# Patient Record
Sex: Male | Born: 1960 | Race: White | Hispanic: No | Marital: Married | Smoking: Former smoker
Health system: Southern US, Community
[De-identification: ages and names within clinical notes are randomized; demographics above are authoritative.]

## PROBLEM LIST (undated history)

## (undated) DIAGNOSIS — I8390 Asymptomatic varicose veins of unspecified lower extremity: Secondary | ICD-10-CM

## (undated) DIAGNOSIS — G4730 Sleep apnea, unspecified: Secondary | ICD-10-CM

## (undated) DIAGNOSIS — K759 Inflammatory liver disease, unspecified: Secondary | ICD-10-CM

## (undated) DIAGNOSIS — M1990 Unspecified osteoarthritis, unspecified site: Secondary | ICD-10-CM

## (undated) DIAGNOSIS — I2510 Atherosclerotic heart disease of native coronary artery without angina pectoris: Secondary | ICD-10-CM

## (undated) DIAGNOSIS — R7301 Impaired fasting glucose: Secondary | ICD-10-CM

## (undated) DIAGNOSIS — K219 Gastro-esophageal reflux disease without esophagitis: Secondary | ICD-10-CM

## (undated) DIAGNOSIS — I10 Essential (primary) hypertension: Secondary | ICD-10-CM

## (undated) DIAGNOSIS — E669 Obesity, unspecified: Secondary | ICD-10-CM

## (undated) HISTORY — DX: Asymptomatic varicose veins of unspecified lower extremity: I83.90

## (undated) HISTORY — PX: CARDIAC CATHETERIZATION: SHX172

## (undated) HISTORY — DX: Essential (primary) hypertension: I10

## (undated) HISTORY — DX: Impaired fasting glucose: R73.01

## (undated) HISTORY — DX: Obesity, unspecified: E66.9

---

## 1989-07-19 HISTORY — PX: OTHER SURGICAL HISTORY: SHX169

## 2006-08-08 LAB — HM COLONOSCOPY

## 2007-11-19 DIAGNOSIS — R7301 Impaired fasting glucose: Secondary | ICD-10-CM

## 2007-11-19 HISTORY — DX: Impaired fasting glucose: R73.01

## 2008-03-16 ENCOUNTER — Ambulatory Visit: Payer: Self-pay | Admitting: Family Medicine

## 2008-03-16 ENCOUNTER — Encounter: Admission: RE | Admit: 2008-03-16 | Discharge: 2008-03-16 | Payer: Self-pay | Admitting: Family Medicine

## 2008-03-16 DIAGNOSIS — K219 Gastro-esophageal reflux disease without esophagitis: Secondary | ICD-10-CM | POA: Insufficient documentation

## 2008-03-17 ENCOUNTER — Telehealth: Payer: Self-pay | Admitting: Family Medicine

## 2008-03-21 ENCOUNTER — Encounter: Admission: RE | Admit: 2008-03-21 | Discharge: 2008-03-21 | Payer: Self-pay | Admitting: Family Medicine

## 2008-03-21 ENCOUNTER — Ambulatory Visit: Payer: Self-pay | Admitting: Family Medicine

## 2008-03-22 ENCOUNTER — Encounter: Admission: RE | Admit: 2008-03-22 | Discharge: 2008-03-22 | Payer: Self-pay | Admitting: Family Medicine

## 2008-03-22 ENCOUNTER — Encounter: Payer: Self-pay | Admitting: Family Medicine

## 2008-03-23 ENCOUNTER — Ambulatory Visit: Payer: Self-pay | Admitting: Family Medicine

## 2008-04-13 ENCOUNTER — Encounter: Admission: RE | Admit: 2008-04-13 | Discharge: 2008-04-13 | Payer: Self-pay | Admitting: Family Medicine

## 2008-04-13 ENCOUNTER — Ambulatory Visit: Payer: Self-pay | Admitting: Family Medicine

## 2008-04-13 LAB — CONVERTED CEMR LAB
Alkaline Phosphatase: 88 units/L (ref 39–117)
BUN: 25 mg/dL — ABNORMAL HIGH (ref 6–23)
CO2: 26 meq/L (ref 19–32)
Cholesterol: 192 mg/dL (ref 0–200)
Creatinine, Ser: 0.87 mg/dL (ref 0.40–1.50)
Glucose, Bld: 105 mg/dL — ABNORMAL HIGH (ref 70–99)
HDL: 40 mg/dL (ref >39)
Total Bilirubin: 0.6 mg/dL (ref 0.3–1.2)
Total CHOL/HDL Ratio: 4.8
Total Protein: 7.3 g/dL (ref 6.0–8.3)
Triglycerides: 162 mg/dL — ABNORMAL HIGH (ref <150)
VLDL: 32 mg/dL (ref 0–40)

## 2008-04-14 ENCOUNTER — Encounter: Payer: Self-pay | Admitting: Family Medicine

## 2008-04-20 ENCOUNTER — Ambulatory Visit: Payer: Self-pay | Admitting: Family Medicine

## 2008-04-20 DIAGNOSIS — D126 Benign neoplasm of colon, unspecified: Secondary | ICD-10-CM | POA: Insufficient documentation

## 2008-04-20 LAB — CONVERTED CEMR LAB
Blood in Urine, dipstick: NEGATIVE
Glucose, Urine, Semiquant: NEGATIVE
Specific Gravity, Urine: >=1.03
Urobilinogen, UA: 0.2
pH: 6

## 2008-08-31 ENCOUNTER — Ambulatory Visit: Payer: Self-pay | Admitting: Family Medicine

## 2008-08-31 DIAGNOSIS — R7301 Impaired fasting glucose: Secondary | ICD-10-CM | POA: Insufficient documentation

## 2008-08-31 DIAGNOSIS — I83893 Varicose veins of bilateral lower extremities with other complications: Secondary | ICD-10-CM | POA: Insufficient documentation

## 2008-08-31 LAB — CONVERTED CEMR LAB: Blood Glucose, Fasting: 108 mg/dL

## 2008-09-12 ENCOUNTER — Ambulatory Visit: Payer: Self-pay | Admitting: Family Medicine

## 2008-10-20 ENCOUNTER — Encounter: Admission: RE | Admit: 2008-10-20 | Discharge: 2008-10-20 | Payer: Self-pay | Admitting: Family Medicine

## 2008-10-20 ENCOUNTER — Ambulatory Visit: Payer: Self-pay | Admitting: Family Medicine

## 2008-10-20 DIAGNOSIS — M7710 Lateral epicondylitis, unspecified elbow: Secondary | ICD-10-CM | POA: Insufficient documentation

## 2008-11-18 DIAGNOSIS — I10 Essential (primary) hypertension: Secondary | ICD-10-CM

## 2008-11-18 HISTORY — DX: Essential (primary) hypertension: I10

## 2008-11-21 ENCOUNTER — Ambulatory Visit: Payer: Self-pay | Admitting: Family Medicine

## 2008-11-21 DIAGNOSIS — I10 Essential (primary) hypertension: Secondary | ICD-10-CM | POA: Insufficient documentation

## 2008-12-16 ENCOUNTER — Ambulatory Visit: Payer: Self-pay | Admitting: Family Medicine

## 2009-01-11 ENCOUNTER — Encounter: Payer: Self-pay | Admitting: Family Medicine

## 2009-01-13 LAB — CONVERTED CEMR LAB
BUN: 22 mg/dL (ref 6–23)
CO2: 24 meq/L (ref 19–32)
Chloride: 102 meq/L (ref 96–112)
Potassium: 4.7 meq/L (ref 3.5–5.3)

## 2009-01-16 ENCOUNTER — Telehealth: Payer: Self-pay | Admitting: Family Medicine

## 2009-02-13 ENCOUNTER — Telehealth: Payer: Self-pay | Admitting: Family Medicine

## 2009-02-15 ENCOUNTER — Telehealth: Payer: Self-pay | Admitting: Family Medicine

## 2009-02-22 ENCOUNTER — Telehealth: Payer: Self-pay | Admitting: Family Medicine

## 2009-03-31 ENCOUNTER — Ambulatory Visit: Payer: Self-pay | Admitting: Family Medicine

## 2009-03-31 ENCOUNTER — Encounter: Admission: RE | Admit: 2009-03-31 | Discharge: 2009-03-31 | Payer: Self-pay | Admitting: Family Medicine

## 2009-03-31 DIAGNOSIS — F4321 Adjustment disorder with depressed mood: Secondary | ICD-10-CM | POA: Insufficient documentation

## 2009-06-14 ENCOUNTER — Ambulatory Visit: Payer: Self-pay | Admitting: Family Medicine

## 2009-12-06 ENCOUNTER — Ambulatory Visit: Payer: Self-pay | Admitting: Family Medicine

## 2009-12-06 DIAGNOSIS — K5289 Other specified noninfective gastroenteritis and colitis: Secondary | ICD-10-CM | POA: Insufficient documentation

## 2009-12-07 LAB — CONVERTED CEMR LAB
Amylase: 33 units/L (ref 0–105)
BUN: 22 mg/dL (ref 6–23)
CO2: 22 meq/L (ref 19–32)
Calcium: 9.5 mg/dL (ref 8.4–10.5)
Chloride: 102 meq/L (ref 96–112)
Creatinine, Ser: 0.98 mg/dL (ref 0.40–1.50)
Eosinophils Absolute: 0.1 10*3/uL (ref 0.0–0.7)
Eosinophils Relative: 2 % (ref 0–5)
Glucose, Bld: 100 mg/dL — ABNORMAL HIGH (ref 70–99)
HCT: 47.2 % (ref 39.0–52.0)
Lymphs Abs: 1.5 10*3/uL (ref 0.7–4.0)
MCV: 81.5 fL (ref 78.0–100.0)
Monocytes Relative: 9 % (ref 3–12)
RBC: 5.79 M/uL (ref 4.22–5.81)
WBC: 7 10*3/uL (ref 4.0–10.5)

## 2009-12-08 ENCOUNTER — Telehealth (INDEPENDENT_AMBULATORY_CARE_PROVIDER_SITE_OTHER): Payer: Self-pay | Admitting: *Deleted

## 2009-12-11 ENCOUNTER — Telehealth: Payer: Self-pay | Admitting: Family Medicine

## 2010-01-17 ENCOUNTER — Telehealth: Payer: Self-pay | Admitting: Family Medicine

## 2010-06-04 ENCOUNTER — Telehealth (INDEPENDENT_AMBULATORY_CARE_PROVIDER_SITE_OTHER): Payer: Self-pay | Admitting: *Deleted

## 2010-06-04 ENCOUNTER — Ambulatory Visit: Payer: Self-pay | Admitting: Emergency Medicine

## 2010-06-04 DIAGNOSIS — R112 Nausea with vomiting, unspecified: Secondary | ICD-10-CM | POA: Insufficient documentation

## 2010-06-04 DIAGNOSIS — IMO0002 Reserved for concepts with insufficient information to code with codable children: Secondary | ICD-10-CM | POA: Insufficient documentation

## 2010-10-02 ENCOUNTER — Ambulatory Visit: Payer: Self-pay | Admitting: Family Medicine

## 2010-10-02 DIAGNOSIS — IMO0002 Reserved for concepts with insufficient information to code with codable children: Secondary | ICD-10-CM | POA: Insufficient documentation

## 2010-10-02 DIAGNOSIS — S39012A Strain of muscle, fascia and tendon of lower back, initial encounter: Secondary | ICD-10-CM | POA: Insufficient documentation

## 2010-12-18 NOTE — Assessment & Plan Note (Signed)
Summary: CPE   Vital Signs:  Patient Profile:   50 Years Old Male Height:     71.5 inches Weight:      284 pounds BMI:     39.20 O2 Sat:      98 % O2 treatment:    Room Air Temp:     96.9 degrees F oral Pulse rate:   82 / minute BP sitting:   123 / 72  (left arm) Cuff size:   large  Vitals Entered By: Kathlene November (April 20, 2008 8:31 AM)                 Visit Type:  est PCP:  Seymour Bars DO  Chief Complaint:  CPE .  History of Present Illness: 50 yo WM presents for CPE.  Doing well.  Had labs drawn last month showing normal cholestrol. Fasting glucose elevated at 105.  Plans to repeat today.  Denies CP or DOE.  Has lost a little weight.  Eating changes have improved a little.  Still not exercising regularly.    He is due to f/u with Dr Vernell Barrier in [facility] for colon polyps.  Last scope about 2 yrs ago.  Denies fam hx of prostate or colon cancer or fam hx of ischemic heart dz.  Has distant tobacco use history.    c/o waking up at night 2-3 x.  Drinks a lot of water all day.  Denies trouble with urinary stream or urgency.    Current Allergies: No known allergies   Past Medical History:    obesity    colonoscopy/EGD 2007-- polyps  Past Surgical History:    Reviewed history from 03/16/2008 and no changes required:       pins in R hand - 4th and 5th digits, 1990's   Family History:    Reviewed history from 03/16/2008 and no changes required:       brother and 2 sisters healthy       father died of pancreatic cancer at 74       mother alive, DM  Social History:    Reviewed history from 03/16/2008 and no changes required:       [employer] in [place].       Married to [place] and has 2 sons- 1 grown with a child, local and 58 yo son at home (having problems with).       Quit smoking in 1999 after 36 pk yr hx.       5 ETOH / wk       No exericse.       Poor diet       14 caffeinated drinks per day!    Review of Systems  The patient denies anorexia, fever, weight  loss, weight gain, vision loss, decreased hearing, hoarseness, chest pain, syncope, dyspnea on exertion, peripheral edema, prolonged cough, headaches, hemoptysis, abdominal pain, melena, hematochezia, severe indigestion/heartburn, hematuria, incontinence, genital sores, muscle weakness, suspicious skin lesions, transient blindness, difficulty walking, depression, unusual weight change, abnormal bleeding, enlarged lymph nodes, angioedema, breast masses, and testicular masses.     Physical Exam  General:     alert, well-developed, well-nourished, and well-hydrated.   Head:     normocephalic and atraumatic.   Eyes:     pupils equal, pupils round, and pupils reactive to light.   Ears:     EACs patent; TMs translucent and gray with good cone of light and bony landmarks.  Nose:     no external deformity  and no nasal discharge.   Mouth:     good dentition and pharynx pink and moist.  low hanging uvula Neck:     supple and no masses.   Lungs:     Normal respiratory effort, chest expands symmetrically. Lungs are clear to auscultation, no crackles or wheezes. Heart:     Normal rate and regular rhythm. S1 and S2 normal without gallop, murmur, click, rub or other extra sounds. Abdomen:     RUQ still TTP with vol guarding. soft, normal bowel sounds, no distention, no hepatomegaly, and no splenomegaly.   Msk:     no joint tenderness, no joint swelling, and no joint warmth.   Pulses:     2+ radial and pedal pulses Extremities:     varicose veins both LE's Skin:     color normal and no rashes.  soft tissue R forearm cyst vs lipoma approx 1.5 cm, nontender and 1 cm lipoma mid back Cervical Nodes:     No lymphadenopathy noted    Impression & Recommendations:  Problem # 1:  Preventive Health Care (ICD-V70.0) Reviewed keeping healthy checklist for men. BMI in obese range.  BP at goal.   Fasting glucose rechecked today. Add ASA 81 mg /day. Work on [employer], exercise and wt  loss. Consider OSA screen/ stress test. FLP at goal 5-09. F/U with GI for colon polyps. UA normal for frequent urination at night.    Complete Medication List: 1)  Omeprazole 20 Mg Cpdr (Omeprazole) .... Take one tablet by mouth once a day 2)  Proair Hfa 108 (90 Base) With Spacer  .... 2 puffs q 4-6 hrs prn  Other Orders: Gastroenterology Referral (GI) Tdap => 28yrs IM (46270) Admin 1st Vaccine (35009) Capillary Blood Glucose (38182) Fingerstick (99371) UA Dipstick w/o Micro (automated)  (81003)    ]  Tetanus/Td Vaccine    Vaccine Type: Tdap    Site: left deltoid    Mfr: Sanofi Pasteur    Dose: 0.5 ml    Route: IM    Given by: Kathlene November    Exp. Date: 12/24/2009    Lot #: [id number]    VIS given: 10/06/07 version given April 20, 2008.   Laboratory Results   Urine Tests  Date/Time Received: 04/20/08 @ 8:55am Date/Time Reported: 04/20/08 @ 8:55am  Routine Urinalysis   Color: yellow Appearance: Clear Glucose: negative   (Normal Range: Negative) Bilirubin: negative   (Normal Range: Negative) Ketone: negative   (Normal Range: Negative) Spec. Gravity: >=1.030   (Normal Range: 1.003-1.035) Blood: negative   (Normal Range: Negative) pH: 6.0   (Normal Range: 5.0-8.0) Protein: 30   (Normal Range: Negative) Urobilinogen: 0.2   (Normal Range: 0-1) Nitrite: negative   (Normal Range: Negative) Leukocyte Esterace: negative   (Normal Range: Negative)     Blood Tests   Date/Time Received: 04/20/08 @ 8:55am Date/Time Reported: 04/20/08 @ 8:55am  Glucose (fasting): 101 mg/dL   (Normal Range: 81-017)

## 2010-12-18 NOTE — Assessment & Plan Note (Signed)
Summary: CHEST PAINS,VOMITING,& DIARRHEA/KH   Vital Signs:  Patient Profile:   50 Years Old Male CC:      vomiting, diarrhea, last night, chest pain X last night Height:     71.5 inches Weight:      291 pounds O2 Sat:      96 % O2 treatment:    Room Air Temp:     97.2 degrees F oral Pulse rate:   68 / minute Resp:     18 per minute BP sitting:   104 / 67  (right arm) Cuff size:   large  Pt. in pain?   yes    Location:   chest    Intensity:   4    Type:       heaviness  Vitals Entered By: Lajean Saver RN (June 04, 2010 10:43 AM)                   Updated Prior Medication List: OMEPRAZOLE 20 MG  CPDR (OMEPRAZOLE) Take one tablet by mouth once a day TRIAMTERENE-HCTZ 37.5-25 MG TABS (TRIAMTERENE-HCTZ) 1 TAB by mouth DAILY WELLBUTRIN XL 300 MG XR24H-TAB (BUPROPION HCL) 1 tab by mouth daily FISH OIL 1000 MG CAPS (OMEGA-3 FATTY ACIDS) Take 2 caps by mouth once daily  Current Allergies (reviewed today): ACE INHIBITORSHistory of Present Illness Chief Complaint: vomiting, diarrhea, last night, chest pain X last night History of Present Illness: 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  He felt that he didn't drink enough water.  Then later on developed mild N/V/D.  His son has also been sick last week and doesn't know if that's where it came from.  Today he is feeling better after getting rest.  However, in the shower last night was reaching up to get something and fell a pull in his left neck and chest.  He thinks this is muscular but his wife wanted him to get checked out.  No personal cardiac history except mild HTN and impaired fasting glucose. +stress, +occ MJ but not Tobacco. +GERD.  He feels sore in his chest and shoulders.  He also admits to eating 25 hot wings a few days ago and 2 sausage biscuits last night which made him feel worse.  REVIEW OF SYSTEMS Constitutional Symptoms      Denies fever, chills, night sweats, weight loss, weight gain, and fatigue.  Eyes        Denies change in vision, eye pain, eye discharge, glasses, contact lenses, and eye surgery. Ear/Nose/Throat/Mouth       Denies hearing loss/aids, change in hearing, ear pain, ear discharge, dizziness, frequent runny nose, frequent nose bleeds, sinus problems, sore throat, hoarseness, and tooth pain or bleeding.  Respiratory       Denies dry cough, productive cough, wheezing, shortness of breath, asthma, bronchitis, and emphysema/COPD.  Cardiovascular       Complains of chest pain.      Denies murmurs and tires easily with exhertion.      Comments: ? possible pulled muscle   Gastrointestinal       Complains of nausea/vomiting and diarrhea.      Denies stomach pain, constipation, blood in bowel movements, and indigestion.      Comments: X last night Genitourniary       Denies painful urination, kidney stones, and loss of urinary control. Neurological       Denies paralysis, seizures, and fainting/blackouts. Musculoskeletal       Denies muscle pain, joint pain, joint stiffness,  decreased range of motion, redness, swelling, muscle weakness, and gout.  Skin       Denies bruising, unusual mles/lumps or sores, and hair/skin or nail changes.  Psych       Denies mood changes, temper/anger issues, anxiety/stress, speech problems, depression, and sleep problems. Other Comments: patient was bathing in shower last night and felt something "pop" while he was reaching. He c/o heavy pain in chest that is relieved by laying down.   Past History:  Past Medical History: Reviewed history from 11/21/2008 and no changes required. obesity colonoscopy/EGD 2007-- polyps Impaired fasting glucose 2009 varicose veins HTN 11-2008  Past Surgical History: Reviewed history from 03/16/2008 and no changes required. pins in R hand - 4th and 5th digits, 1990's  Family History: Reviewed history from 03/16/2008 and no changes required. brother and 2 sisters healthy father died of pancreatic cancer at  33 mother alive, DM  Social History: Reviewed history from 03/16/2008 and no changes required. [employer] in [place]. Married to [place] and has 2 sons- 1 grown with a child, local and 70 yo son at home (having problems with). Quit smoking in 1999 after 36 pk yr hx. 5 ETOH / wk No exericse. Poor diet 14 caffeinated drinks per day! Physical Exam General appearance: well developed, well nourished, no acute distress Ears: normal, no lesions or deformities Nasal: mucosa pink, nonedematous, no septal deviation, turbinates normal Oral/Pharynx: tongue normal, posterior pharynx without erythema or exudate Neck: neck supple,  trachea midline, no masses.  +TTP left SCM and pec muscles Chest/Lungs: no rales, wheezes, or rhonchi bilateral, breath sounds equal without effort Heart: regular rate and  rhythm, no murmur Abdomen: soft, non-tender without obvious organomegaly Extremities: normal extremities Skin: no obvious rashes or lesions MSE: oriented to time, place, and person Assessment New Problems: STRAIN, CHEST WALL (ICD-848.8) NAUSEA WITH VOMITING (ICD-787.01)  I feel this is due to chest wall strain from yesterday's hard work and not cardiac in origin.  Also likely has viral gastroenteritis worsened yesterday by heat-related dehydration and his recent diet.  Patient Education: Patient and/or caregiver instructed in the following: rest, fluids, Ibuprofen prn, diet, exercise, weight loss. EKG today shows mild ST/T changes, but otherwise normal.  Demonstrates willingness to comply.  Plan New Medications/Changes: ZOFRAN ODT 8 MG TBDP (ONDANSETRON) 1 dissolvable tab by mouth Q8 hours as needed for nausea  #12 x 0, 06/04/2010, Hoyt Koch MD  New Orders: New Patient Level III [phone] EKG w/ Interpretation [93000] Planning Comments:   If CP, SOB, worsening symptoms, go to the ER.  Hydrate Bland diet for the next few days Avoid heavy lifting for a few days Follow-up with your  primary care physician if not improving   The patient and/or caregiver has been counseled thoroughly with regard to medications prescribed including dosage, schedule, interactions, rationale for use, and possible side effects and they verbalize understanding.  Diagnoses and expected course of recovery discussed and will return if not improved as expected or if the condition worsens. Patient and/or caregiver verbalized understanding.  Prescriptions: ZOFRAN ODT 8 MG TBDP (ONDANSETRON) 1 dissolvable tab by mouth Q8 hours as needed for nausea  #12 x 0   Entered and Authorized by:   Hoyt Koch MD   Signed by:   Hoyt Koch MD on 06/04/2010   Method used:   Printed then faxed to ...       [facility] [address] [id number]* (retail)       [address]  [place], [place]  [address]       Ph: [phone]       Fax: [phone]   RxID:   [id number]   Orders Added: 1)  New Patient Level III [84132] 2)  EKG w/ Interpretation [93000]

## 2010-12-18 NOTE — Assessment & Plan Note (Signed)
Summary: f/u pneumonia/ labs   Vital Signs:  Patient Profile:   50 Years Old Male Height:     71.5 inches Weight:      288 pounds BMI:     39.75 O2 Sat:      97 % Temp:     97.9 degrees F oral Pulse rate:   82 / minute BP sitting:   117 / 69  (left arm) Cuff size:   large  Vitals Entered By: Harlene Salts (Apr 13, 2008 8:16 AM)                 PCP:  Seymour Bars DO  Chief Complaint:  follow-up pneumonia.  History of Present Illness: 50 yo WM presents for f/u pneumonia.  He is about 90% better.  Cough has resolved.  Still has some slight R sided pleuritic CP. Not needing any medication.  Denies SOB except with heavy exertion.  Energy level is almost back to normal.  Sleeping well.  Has 2nd hand smoke exposure at work.  Not needing inhalers.    Due for labs.      Current Allergies: No known allergies    Social History:    Reviewed history from 03/16/2008 and no changes required:       [employer] in [place].       Married to [place] and has 2 sons- 1 grown with a child, local and 44 yo son at home (having problems with).       Quit smoking in 1999 after 36 pk yr hx.       5 ETOH / wk       No exericse.       Poor diet       14 caffeinated drinks per day!    Review of Systems      See HPI   Physical Exam  General:     alert, well-developed, well-nourished, and well-hydrated.  obese Head:     normocephalic and atraumatic.   Eyes:     conjunctiva clear Nose:     no external deformity and no nasal discharge.   Mouth:     good dentition and pharynx pink and moist.   Neck:     supple and no masses.   Lungs:     Normal respiratory effort, chest expands symmetrically. Lungs are clear to auscultation, no crackles or wheezes. Heart:     Normal rate and regular rhythm. S1 and S2 normal without gallop, murmur, click, rub or other extra sounds. Skin:     color normal and no rashes.   Cervical Nodes:     No lymphadenopathy noted Psych:     good eye  contact, not anxious appearing, and not depressed appearing.      Impression & Recommendations:  Problem # 1:  BACTERIAL PNEUMONIA, RIGHT LOWER LOBE (ICD-482.9) Assessment: Improved Improved clinically.  Finished treatment.  No wheezing on exam or DOE.  F/U CXR today.   The following medications were removed from the medication list:    Doxycycline Monohydrate 100 Mg Tabs (Doxycycline monohydrate) .Marland Kitchen... 1 tab by mouth two times a day x 10 days  Orders: T-DG Chest 2 View (04540)   Problem # 2:  CHEST WALL PAIN, ACUTE (ICD-786.52) Assessment: Improved Improved.  MSK in origin from cough.  Not needing OTC NSAIDs any longer.  Much improved.  Complete Medication List: 1)  Omeprazole 20 Mg Cpdr (Omeprazole) .... Take one tablet by mouth once a day 2)  Proair Hfa  108 (90 Base) With Spacer  .... 2 puffs q 4-6 hrs prn  Other Orders: T-Comprehensive Metabolic Panel [id number]) T-Lipid Profile ([id number])   Patient Instructions: 1)  Have fasting labs drawn and I will mail your results. 2)  CXR today.  Will call you w/ result tomorrow.   3)  If you still have cardiomegaly, may need an echocardiogram to evaluate your heart. 4)  Continue to work on diet and exercise. 5)  F/U in October for Varicose Veins.     ]

## 2010-12-18 NOTE — Progress Notes (Signed)
Summary: COUGHING WITH LISINOPRIL  Phone Note Call from Patient Call back at Work Phone [phone] Call back at [phone]   Caller: Patient Call For: Seymour Bars DO Summary of Call: HAVING ALOT OF COUGHING ON LISINOPRIL. USES [facility]. Initial call taken by: Harlene Salts,  February 13, 2009 11:25 AM  Follow-up for Phone Call        I changed him to Hyzaar which is the only generic ARB on the market.  This should be cheaper than Benicar.  Make sure he f/u for BP in 1 month. Follow-up by: Seymour Bars DO,  February 13, 2009 11:31 AM   New Allergies: ACE INHIBITORS New/Updated Medications: HYZAAR 50-12.5 MG TABS (LOSARTAN POTASSIUM-HCTZ) 1 tab by mouth daily New Allergies: ACE INHIBITORS  Prescriptions: HYZAAR 50-12.5 MG TABS (LOSARTAN POTASSIUM-HCTZ) 1 tab by mouth daily  #30 x 1   Entered and Authorized by:   Seymour Bars DO   Signed by:   Seymour Bars DO on 02/13/2009   Method used:   Electronically to        [facility] [phone]* (retail)       [address] [place], [place]  [address]       Ph: [phone]       Fax: [phone]   RxID:   [id number]   Appended Document: COUGHING WITH LISINOPRIL PATIENT INFORMED.LM

## 2010-12-18 NOTE — Letter (Signed)
Summary: Lipid Letter  [facility]  [address], [address]   [place], [place] [address]   Phone: [phone]  Fax: [phone]    04/14/2008  Bobby White [address] [place], [place]  [address]  Dear Bobby White:  We have carefully reviewed your last lipid profile from 04/13/2008 and the results are noted below with a summary of recommendations for lipid management.    Cholesterol:       192     Goal: < 200   HDL "good" Cholesterol:   40     Goal: > 40   LDL "bad" Cholesterol:   120     Goal: < 130   Triglycerides:       162     Goal: < 150    Cholestrol is close to goal.  Work on [employer] and exercise and recheck in 1 yr.  Fasting glucose is in the pre-diabetic range (100-125) and will need to repeat in the office in the next month.  Please call for nurse visit and come in fasting. Your kidney and liver function are normal.     TLC Diet (Therapeutic Lifestyle Change): Saturated Fats & Transfatty acids should be kept < 7% of total calories ***Reduce Saturated Fats Polyunstaurated Fat can be up to 10% of total calories Monounsaturated Fat Fat can be up to 20% of total calories Total Fat should be no greater than 25-35% of total calories Carbohydrates should be 50-60% of total calories Protein should be approximately 15% of total calories Fiber should be at least 20-30 grams a day ***Increased fiber may help lower LDL Total Cholesterol should be < 200mg /day Consider adding plant stanol/sterols to diet (example: Benacol spread) ***A higher intake of unsaturated fat may reduce Triglycerides and Increase HDL    Adjunctive Measures (may lower LIPIDS and reduce risk of Heart Attack) include: Aerobic Exercise (20-30 minutes 3-4 times a week) Limit Alcohol Consumption Weight Reduction Aspirin 75-81 mg a day by mouth (if not allergic or contraindicated) Folic Acid 1mg  a day by mouth Dietary Fiber 20-30 grams a day by mouth     Current Medications: 1)     Omeprazole 20 Mg  Cpdr (Omeprazole) .... Take one tablet by mouth once a day 2)    Proair Hfa 108 (90 Base) With Spacer  .... 2 puffs q 4-6 hrs prn  If you have any questions, please call. We appreciate being able to work with you.   Sincerely,    [facility] Seymour Bars DO

## 2010-12-18 NOTE — Progress Notes (Signed)
Summary: Chest pain and SOB  Phone Note Call from Patient   Caller: Patient Summary of Call: Pt's wife called requesting apt bc Pt c/o chest pain, SOB, vomiting and diarrhea x 2 days. I advised wife that we have no openings and w/ chest pain and SOB Pt should go to UC or ED asap. Wife agreed to take Pt.  Initial call taken by: Payton Spark CMA,  June 04, 2010 9:14 AM

## 2010-12-18 NOTE — Progress Notes (Signed)
Summary: Feeling better  Phone Note Call from Patient   Caller: Patient Summary of Call: Pt [facility] stating he was feeling a little better. He is taking Rx as directed and holding down food. Pt states he is still a little nauseated but other than that he is better. Pt said if we wanted to talk w/ him to give him a call.  Initial call taken by: Payton Spark CMA,  December 08, 2009 3:51 PM

## 2010-12-18 NOTE — Letter (Signed)
Summary: Out of Work  [facility]  [address], [address]   [place], [place] [address]   Phone: [phone]  Fax: [phone]    December 06, 2009   Employee:  Bobby White    To Whom It May Concern:   For Medical reasons, please excuse the above named employee from work for the following dates:  Start:   Jan 17, 18, 19, 20th  End:   Jan 21st  If you need additional information, please feel free to contact our office.         Sincerely,    Seymour Bars DO

## 2010-12-18 NOTE — Letter (Signed)
Summary: Out of Work  [facility]  [address], [address]   [place], [place] [address]   Phone: [phone]  Fax: [phone]    October 02, 2010   Employee:  Bobby White    To Whom It May Concern:   For Medical reasons, please excuse the above named employee from work for the following dates:  Start:   Nov 15th - 18th  End:   Nov 19th  If you need additional information, please feel free to contact our office.         Sincerely,    Seymour Bars DO

## 2010-12-18 NOTE — Assessment & Plan Note (Signed)
Summary: back pain   Vital Signs:  Patient profile:   50 year old male Height:      71.5 inches Weight:      283 pounds BMI:     39.06 O2 Sat:      98 % on Room air Pulse rate:   82 / minute BP sitting:   111 / 80  (left arm) Cuff size:   large  Vitals Entered By: Payton Spark CMA (October 02, 2010 1:48 PM)  O2 Flow:  Room air CC: Back pain after raking leaves x 3 days ago, Back Pain   Primary Care Provider:  Seymour Bars DO  CC:  Back pain after raking leaves x 3 days ago and Back Pain.  History of Present Illness:       This is a 50 year old man who presents with Back Pain.  The symptoms began 3 days ago.  The intensity is described as a 7 out of 10.  He was raking leaves and while lifting up, he heard a pop over the L lower back.  Hurts to sit.  He has tried Piroxicam and a flexeril which helped him sleep.  Using a heating pad which helps some.  Prior hx of LBP pain but no surgeries or chronic back pain.  The patient denies weakness, loss of sensation, fecal incontinence, and urinary incontinence.  The pain is located in the left low back, left thoracic back, and left SI joint.  The pain began at home, suddenly, and after lifting.  The pain is made worse by lying down and flexion.  The pain is made better by inactivity, NSAID medications, muscle relaxants, and heat.    Current Medications (verified): 1)  Omeprazole 20 Mg  Cpdr (Omeprazole) .... Take One Tablet By Mouth Once A Day 2)  Triamterene-Hctz 37.5-25 Mg Tabs (Triamterene-Hctz) .Marland Kitchen.. 1 Tab By Mouth Daily 3)  Wellbutrin Xl 300 Mg Xr24h-Tab (Bupropion Hcl) .Marland Kitchen.. 1 Tab By Mouth Daily 4)  Fish Oil 1000 Mg Caps (Omega-3 Fatty Acids) .... Take 2 Caps By Mouth Once Daily 5)  Zofran Odt 8 Mg Tbdp (Ondansetron) .Marland Kitchen.. 1 Dissolvable Tab By Mouth Q8 Hours As Needed For Nausea  Allergies (verified): 1)  Ace Inhibitors  Past History:  Past Medical History: Reviewed history from 11/21/2008 and no changes  required. obesity colonoscopy/EGD 2007-- polyps Impaired fasting glucose 2009 varicose veins HTN 11-2008  Social History: Reviewed history from 03/16/2008 and no changes required. [employer] in [place]. Married to [place] and has 2 sons- 1 grown with a child, local and 72 yo son at home (having problems with). Quit smoking in 1999 after 36 pk yr hx. 5 ETOH / wk No exericse. Poor diet 14 caffeinated drinks per day!  Review of Systems      See HPI  Physical Exam  General:  obese WM in mild distress, standing Head:  normocephalic and atraumatic.   Lungs:  Normal respiratory effort, chest expands symmetrically. Lungs are clear to auscultation, no crackles or wheezes. Heart:  Normal rate and regular rhythm. S1 and S2 normal without gallop, murmur, click, rub or other extra sounds. Msk:  paraspinal muscle fullness L> R side from upper thoracic down to L2 with limited active flexion to 60 deg, full extension w/o pain.  able to heel toe walk.  + seated straight leg raise on the L. able to SB and rotate from thoracic spine Extremities:  varicose veins both legs with trace edema Neurologic:  gait normal and DTRs  symmetrical and normal.   Skin:  color normal.     Impression & Recommendations:  Problem # 1:  BACK STRAIN (ICD-847.9) MSK T and L spine strain involving the L quadratus lumborum from lifting injury 3 days ago. No red flags. Will treat with Ibuprofen 800 mg three times a day with meal, flexeril and night and vicodin for severe pain. Out of work thru this wk. Use heating pad/ thermacare heat wraps for comfort.  Start home stretches -- h/o given and take it easy. If not improved in 2 wks, please call to add PT.  Complete Medication List: 1)  Omeprazole 20 Mg Cpdr (Omeprazole) .... Take one tablet by mouth once a day 2)  Triamterene-hctz 37.5-25 Mg Tabs (Triamterene-hctz) .Marland Kitchen.. 1 tab by mouth daily 3)  Wellbutrin Xl 300 Mg Xr24h-tab (Bupropion hcl) .Marland Kitchen.. 1 tab by mouth  daily 4)  Fish Oil 1000 Mg Caps (Omega-3 fatty acids) .... Take 2 caps by mouth once daily 5)  Flexeril 10 Mg Tabs (Cyclobenzaprine hcl) .Marland Kitchen.. 1 tab by mouth at bedtime as needed back spasm 6)  Vicodin 5-500 Mg Tabs (Hydrocodone-acetaminophen) .Marland Kitchen.. 1-2 tabs by mouth three times a day as needed severe pain 7)  Ibuprofen 800 Mg Tabs (Ibuprofen) .Marland Kitchen.. 1 tab by mouth three times a day with food x 10 days  Patient Instructions: 1)  Start RX Ibuprofen 800 mg 3 x a day (with meals) x 10 days for pain and inflammation. 2)  Use Cyclobenzaprine at night for muscle spasm. 3)  Use Vicodin for severe pain, take with food.  Can cause sedation and  constipation. 4)  Start low back stretches. 5)  Take it easy for the next 2 wks. 6)  If not improved after 2 wks, please call. 7)  Can use heating pad and thermacare heat wraps also.   Prescriptions: IBUPROFEN 800 MG TABS (IBUPROFEN) 1 tab by mouth three times a day with food x 10 days  #30 x 0   Entered and Authorized by:   Seymour Bars DO   Signed by:   Seymour Bars DO on 10/02/2010   Method used:   Electronically to        [facility] [phone]* (retail)       [address] [place], [place]  [address]       Ph: [phone]       Fax: [phone]   RxID:   [id number] VICODIN 5-500 MG TABS (HYDROCODONE-ACETAMINOPHEN) 1-2 tabs by mouth three times a day as needed severe pain  #24 x 0   Entered and Authorized by:   Seymour Bars DO   Signed by:   Seymour Bars DO on 10/02/2010   Method used:   Printed then faxed to ...       [address] [phone]* (retail)       [address] [place], [place]  [address]       Ph: [phone]       Fax: [phone]   RxID:   [id number] FLEXERIL 10 MG TABS (CYCLOBENZAPRINE HCL) 1 tab by mouth at bedtime as needed back spasm  #12 x 0   Entered and Authorized by:   Seymour Bars DO   Signed by:   Seymour Bars DO on 10/02/2010   Method used:   Electronically to        [facility] [phone]* (retail)  [address], [place]  [address]       Ph: [phone]       Fax: [phone]   RxID:   [id number]    Orders Added: 1)  Est. Patient Level III [244[address]]

## 2010-12-18 NOTE — Progress Notes (Signed)
Summary: fever blisters  Phone Note Call from Patient   Caller: Patient Summary of Call: Pt LMOM stating that he has very painful cold sores on his lips. He has used Abreva but it is not helping much. Pt would like to know if you will call in Rx for fever blisters or needs OV. Pt has never used any Rx for this before. Please advise.  Initial call taken by: Payton Spark CMA,  January 17, 2010 11:03 AM  Follow-up for Phone Call        Treat with RX Valtrex 2grams q 12 hrs x 2 doses then done.  If not improving, needs OV.   Follow-up by: Seymour Bars DO,  January 17, 2010 11:11 AM    New/Updated Medications: VALTREX 1 GM TABS (VALACYCLOVIR HCL) 2 tabs by mouth q 12 hrs x 1 days Prescriptions: VALTREX 1 GM TABS (VALACYCLOVIR HCL) 2 tabs by mouth q 12 hrs x 1 days  #4 tabs x 0   Entered and Authorized by:   Seymour Bars DO   Signed by:   Seymour Bars DO on 01/17/2010   Method used:   Electronically to        [facility] [phone]* (retail)       [address] [place], [place]  [address]       Ph: [phone]       Fax: [phone]   RxID:   [id number]   Appended Document: fever blisters Pt aware

## 2010-12-18 NOTE — Progress Notes (Signed)
Summary: Nausea  Phone Note Call from Patient   Caller: Patient Summary of Call: Pt LMOM stating that he is back at work but is still nauseated, dizzy and shaky from time to time. Pt would like to know if he can have more zofran. Please advise.  Initial call taken by: Payton Spark CMA,  December 11, 2009 1:33 PM  Follow-up for Phone Call        I will RF his Zofran and change his Omeprazaole to Protonix once daily for acid reflux (pick up samples).  Stick to a bland diet.  If not 100% better by in 2 days, set up F/U appt with me.   Follow-up by: Seymour Bars DO,  December 11, 2009 1:51 PM     Appended Document: Nausea LMOM informing Pt of the above.   Appended Document: Nausea

## 2010-12-18 NOTE — Progress Notes (Signed)
Summary: BENICAR TOO EXPENSIVE  Phone Note Call from Patient Call back at Work Phone [phone] Call back at [phone]   Caller: Patient Call For: Seymour Bars DO Summary of Call: PATIENT WANT CHEAPER MED THAN BENICAR. USES [facility]. Initial call taken by: Harlene Salts,  January 16, 2009 4:03 PM  Follow-up for Phone Call        does he have a savings card? Follow-up by: Seymour Bars DO,  January 16, 2009 4:26 PM  Additional Follow-up for Phone Call Additional follow up Details #1::        yes and still cost him $45 Additional Follow-up by: Harlene Salts,  January 16, 2009 4:39 PM    Additional Follow-up for Phone Call Additional follow up Details #2::    I changed him to Lisinopril/HCTZ once daily. Have him return for f/u BP in 1 month to recheck on the new medicine. Follow-up by: Seymour Bars DO,  January 16, 2009 4:45 PM  New/Updated Medications: LISINOPRIL-HYDROCHLOROTHIAZIDE 20-12.5 MG TABS (LISINOPRIL-HYDROCHLOROTHIAZIDE) 1 tab by mouth daily   Prescriptions: LISINOPRIL-HYDROCHLOROTHIAZIDE 20-12.5 MG TABS (LISINOPRIL-HYDROCHLOROTHIAZIDE) 1 tab by mouth daily  #90 x 0   Entered and Authorized by:   Seymour Bars DO   Signed by:   Seymour Bars DO on 01/16/2009   Method used:   Electronically to        [facility] [phone]* (retail)       [address] [place], [place]  [address]       Ph: [phone]       Fax: [phone]   RxID:   [id number]   Appended Document: BENICAR TOO EXPENSIVE patient informed via voicemail.LM

## 2010-12-18 NOTE — Assessment & Plan Note (Signed)
Summary: HTN f/u   Vital Signs:  Patient Profile:   50 Years Old Male Height:     71.5 inches Weight:      302 pounds BMI:     41.68 O2 Sat:      95 % Temp:     97.8 degrees F oral Pulse rate:   72 / minute BP sitting:   124 / 76  (left arm) Cuff size:   large  Vitals Entered By: Harlene Salts (December 16, 2008 8:13 AM)                 Chief Complaint:  follow-up BP and bronchitis.  Hypertension History:      He denies headache, chest pain, palpitations, dyspnea with exertion, orthopnea, PND, peripheral edema, visual symptoms, neurologic problems, syncope, and side effects from treatment.  He notes no problems with any antihypertensive medication side effects.  Further comments include: Doing well on Benicar 20 mg/ day x 3 wks.  A little lightheaded with position changes, but improving.  Marland Kitchen        Positive major cardiovascular risk factors include male age 8 years old or older and hypertension.  Negative major cardiovascular risk factors include no history of diabetes or hyperlipidemia, negative family history for ischemic heart disease, and non-tobacco-user status.        Further assessment for target organ damage reveals no history of ASHD, cardiac end-organ damage (CHF/LVH), stroke/TIA, peripheral vascular disease, renal insufficiency, or hypertensive retinopathy.       Current Allergies: No known allergies   Past Medical History:    Reviewed history from 11/21/2008 and no changes required:       obesity       colonoscopy/EGD 2007-- polyps       Impaired fasting glucose 2009       varicose veins       HTN 11-2008   Social History:    Reviewed history from 03/16/2008 and no changes required:       [employer] in [place].       Married to [place] and has 2 sons- 1 grown with a child, local and 78 yo son at home (having problems with).       Quit smoking in 1999 after 36 pk yr hx.       5 ETOH / wk       No exericse.       Poor diet       14 caffeinated drinks  per day!    Review of Systems      See HPI   Physical Exam  General:     alert, well-developed, well-nourished, and well-hydrated.  obese Head:     normocephalic and atraumatic.   Mouth:     pharynx pink and moist.   Neck:     no masses.   Lungs:     Normal respiratory effort, chest expands symmetrically. Lungs are clear to auscultation, no crackles or wheezes. Heart:     Normal rate and regular rhythm. S1 and S2 normal without gallop, murmur, click, rub or other extra sounds. Extremities:     no LE edema Psych:     good eye contact, not anxious appearing, and not depressed appearing.      Impression & Recommendations:  Problem # 1:  HYPERTENSION, BENIGN ESSENTIAL (ICD-401.1) Assessment: Improved Much improved on Benicar 20 mg/ day.  Continue.  RX with RFs x 6 mos given.  Update BMP 6 wks from new start.  Lab order given.  Work on diet, exercise and wt loss. His updated medication list for this problem includes:    Benicar 20 Mg Tabs (Olmesartan medoxomil) .Marland Kitchen... 1 tab by mouth daily  Orders: T-Basic Metabolic Panel [id number])  BP today: 124/76 Prior BP: 144/94 (11/21/2008)  10 Yr Risk Heart Disease: 7 %  Labs Reviewed: Creat: 0.87 (04/13/2008) Chol: 192 (04/13/2008)   HDL: 40 (04/13/2008)   LDL: 120 (04/13/2008)   TG: 162 (04/13/2008)   Problem # 2:  ACUTE BRONCHITIS (ICD-466.0) Assessment: Improved Improved after Zithromax, finished over 1 wk ago.  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  Advised him to wear a mask and rinse nose out at end of the day with nasal saline.  Complete Medication List: 1)  Omeprazole 20 Mg Cpdr (Omeprazole) .... Take one tablet by mouth once a day 2)  Proair Hfa 108 (90 Base) With Spacer  .... 2 puffs q 4-6 hrs prn 3)  Graduated Compression Hose Knee -high  .... Wear daily as directed 4)  Benicar 20 Mg Tabs (Olmesartan medoxomil) .Marland Kitchen.. 1 tab by mouth daily  Hypertension Assessment/Plan:       The patient's hypertensive risk group is category B: At least one risk factor (excluding diabetes) with no target organ damage.  His calculated 10 year risk of coronary heart disease is 7 %.  Today's blood pressure is 124/76.  His blood pressure goal is < 140/90.   Patient Instructions: 1)  Have lab drawn middle of Feb (non-fasting).  I will call you w/ result. 2)  Stay on current meds. 3)  Wear a mask a work and use Nasal saline to rinse your nose morning and night.   4)  Return for f/u in 6 mos, sooner if needed.   Prescriptions: BENICAR 20 MG TABS (OLMESARTAN MEDOXOMIL) 1 tab by mouth daily  #30 x 5   Entered and Authorized by:   Seymour Bars DO   Signed by:   Seymour Bars DO on 12/16/2008   Method used:   Electronically to        [facility] [phone]* (retail)       [address] [place], [place]  [address]       Ph: [phone]       Fax: [phone]   RxID:   [id number]

## 2010-12-18 NOTE — Assessment & Plan Note (Signed)
Summary: gastroenteritis   Vital Signs:  Patient profile:   50 year old male Height:      71.5 inches Weight:      291 pounds BMI:     40.17 O2 Sat:      97 % on Room air Temp:     98.2 degrees F oral Pulse rate:   85 / minute BP sitting:   127 / 88  (left arm) Cuff size:   large  Vitals Entered By: Payton Spark CMA (December 06, 2009 10:29 AM)  O2 Flow:  Room air CC: N/V/D, sweating and chills x 3 days.    Primary Care Provider:  Seymour Bars DO  CC:  N/V/D and sweating and chills x 3 days. Marland Kitchen  History of Present Illness: 50 yo White presents for 3 days of N/V/D.  He had eaten BF at [employer] that morning.  That night, he had diarrhea and abdominal cramping and chills.  He has had 2 episodes of vomitting after eating.  No one else at home is sick.  No blood in vomit or stool.  Stools are very watery.    He has unsettled stomach.  He is is still having diarrhea every hr.   Right after eating and drinking, he is having diarrhea.  Stools are watery.  He has felt feverish.  He feels dehydrated and lightheaded.    Current Medications (verified): 1)  Omeprazole 20 Mg  Cpdr (Omeprazole) .... Take One Tablet By Mouth Once A Day 2)  Graduated Compression Hose Knee -High .... Wear Daily As Directed 3)  Triamterene-Hctz 37.5-25 Mg Tabs (Triamterene-Hctz) .Marland Kitchen.. 1 Tab By Mouth Daily 4)  Wellbutrin Xl 300 Mg Xr24h-Tab (Bupropion Hcl) .Marland Kitchen.. 1 Tab By Mouth Daily 5)  Fish Oil 1000 Mg Caps (Omega-3 Fatty Acids) .... Take 2 Caps By Mouth Once Daily  Allergies (verified): 1)  Ace Inhibitors  Past History:  Past Medical History: Reviewed history from 11/21/2008 and no changes required. obesity colonoscopy/EGD 2007-- polyps Impaired fasting glucose 2009 varicose veins HTN 11-2008  Social History: Reviewed history from 03/16/2008 and no changes required. [employer] in [place]. Married to [place] and has 2 sons- 1 grown with a child, local and 73 yo son at home (having problems with). Quit  smoking in 1999 after 36 pk yr hx. 5 ETOH / wk No exericse. Poor diet 14 caffeinated drinks per day!  Review of Systems      See HPI  Physical Exam  General:  alert and well-developed.  obese Head:  normocephalic and atraumatic.   Eyes:  sclera non icteric Nose:  no nasal discharge.   Mouth:  lips cracked, o/p pink, slightly dry Neck:  no masses.   Lungs:  Normal respiratory effort, chest expands symmetrically. Lungs are clear to auscultation, no crackles or wheezes. Heart:  Normal rate and regular rhythm. S1 and S2 normal without gallop, murmur, click, rub or other extra sounds. Abdomen:  diffusely TTP with hyperactive BS, no HSM.  ND. Extremities:  no LE edema Skin:  no jaundice Cervical Nodes:  No lymphadenopathy noted Psych:  good eye contact, not anxious appearing, and not depressed appearing.     Impression & Recommendations:  Problem # 1:  GASTROENTERITIS, ACUTE (ICD-558.9) Day 3 of acute gastroenteritis.  Begin Cipro 500 mg two times a day x 3 days since this may be a food-borne illness. Use Zofran as needed for nausea and Immodium AD for the next 2 days as needed diarrhea.  Rehydrate with diluted gatorade.  Check CBC, CMP, Lipase today.  Out of work thru [employer].  Hemodynamically stable w/o reflex tachycardia. If not improved in 48 hrs, get stool studies and may need IV fluids. His updated medication list for this problem includes:    Zofran Odt 8 Mg Tbdp (Ondansetron) .Marland Kitchen... 1 tab by mouth q 8 hrs as needed nausea  Complete Medication List: 1)  Omeprazole 20 Mg Cpdr (Omeprazole) .... Take one tablet by mouth once a day 2)  Graduated Compression Hose Knee -high  .... Wear daily as directed 3)  Triamterene-hctz 37.5-25 Mg Tabs (Triamterene-hctz) .Marland Kitchen.. 1 tab by mouth daily 4)  Wellbutrin Xl 300 Mg Xr24h-tab (Bupropion hcl) .Marland Kitchen.. 1 tab by mouth daily 5)  Fish Oil 1000 Mg Caps (Omega-3 fatty acids) .... Take 2 caps by mouth once daily 6)  Cipro 500 Mg Tabs (Ciprofloxacin  hcl) .Marland Kitchen.. 1 tab by mouth two times a day x 3 days 7)  Zofran Odt 8 Mg Tbdp (Ondansetron) .Marland Kitchen.. 1 tab by mouth q 8 hrs as needed nausea  Other Orders: T-CBC w/Diff ([id number]) T-Comprehensive Metabolic Panel [id number]) T-Lipase [id number]) T-Amylase ([id number])  Patient Instructions: 1)  Labs today. 2)  Will call you w/ results tomorrow. 3)  Take Cipro 2 x a day for 3 days. 4)  Use Zofran (it dissolves on your tongue) for nausea. 5)  Use OTC Immodium AD today and tomorrow as needed for diarrhea. 6)  Drink diluted gatorade throught the day for oral rehydration. 7)  If not improving by Friday afternoon, please call.   Prescriptions: ZOFRAN ODT 8 MG TBDP (ONDANSETRON) 1 tab by mouth q 8 hrs as needed nausea  #14 x 0   Entered and Authorized by:   Seymour Bars DO   Signed by:   Seymour Bars DO on 12/06/2009   Method used:   Electronically to        [facility] [phone]* (retail)       [address] [place], [place]  [address]       Ph: [phone]       Fax: [phone]   RxID:   [id number] CIPRO 500 MG TABS (CIPROFLOXACIN HCL) 1 tab by mouth two times a day x 3 days  #6 x 0   Entered and Authorized by:   Seymour Bars DO   Signed by:   Seymour Bars DO on 12/06/2009   Method used:   Electronically to        [facility] [phone]* (retail)       [address] [place], [place]  [address]       Ph: [phone]       Fax: [phone]   RxID:   [id number]

## 2011-01-09 ENCOUNTER — Ambulatory Visit (INDEPENDENT_AMBULATORY_CARE_PROVIDER_SITE_OTHER): Payer: PRIVATE HEALTH INSURANCE | Admitting: Family Medicine

## 2011-01-09 ENCOUNTER — Encounter: Payer: Self-pay | Admitting: Family Medicine

## 2011-01-09 DIAGNOSIS — E669 Obesity, unspecified: Secondary | ICD-10-CM

## 2011-01-09 DIAGNOSIS — R635 Abnormal weight gain: Secondary | ICD-10-CM | POA: Insufficient documentation

## 2011-01-09 DIAGNOSIS — I10 Essential (primary) hypertension: Secondary | ICD-10-CM

## 2011-01-10 DIAGNOSIS — E785 Hyperlipidemia, unspecified: Secondary | ICD-10-CM | POA: Insufficient documentation

## 2011-01-15 NOTE — Assessment & Plan Note (Signed)
Summary: WT/ BP   Vital Signs:  Patient profile:   50 year old male Height:      71.5 inches Weight:      289 pounds BMI:     39.89 O2 Sat:      97 % on Room air Pulse rate:   87 / minute BP sitting:   133 / 86  (left arm) Cuff size:   large  Vitals Entered By: Payton Spark CMA (January 09, 2011 8:42 AM)  O2 Flow:  Room air CC: Discuss weight loss   Primary Care Provider:  Seymour Bars DO  CC:  Discuss weight loss.  History of Present Illness: Bobby White presents for weight managment.  He has struggled with his weight for years.  He seems to be stuck at 280 or higher.  He feels good at 225.  He has not weighed that since 1992.  He was working 2 jobs at the time.    He has a big appetite now and has tried to choose healthy options.  He eats out about 1 meal/ wk.  He does eat breakfast in the morning.  He has HTN and IFG and is doing well on his meds.  He had an EKG done at [facility] last summer which was normal other than nonspecific ST changes.    He plans to really work on his diet and to start exercising.  Denies CP, DOE or palpitations.    Current Medications (verified): 1)  Omeprazole 20 Mg  Cpdr (Omeprazole) .... Take One Tablet By Mouth Once A Day 2)  Triamterene-Hctz 37.5-25 Mg Tabs (Triamterene-Hctz) .Marland Kitchen.. 1 Tab By Mouth Daily 3)  Wellbutrin Xl 300 Mg Xr24h-Tab (Bupropion Hcl) .Marland Kitchen.. 1 Tab By Mouth Daily 4)  Fish Oil 1000 Mg Caps (Omega-3 Fatty Acids) .... Take 2 Caps By Mouth Once Daily  Allergies (verified): 1)  Ace Inhibitors  Past History:  Past Medical History: Reviewed history from 11/21/2008 and no changes required. obesity colonoscopy/EGD 2007-- polyps Impaired fasting glucose 2009 varicose veins HTN 11-2008  Family History: Reviewed history from 03/16/2008 and no changes required. brother and 2 sisters healthy father died of pancreatic cancer at 3 mother alive, DM  Social History: Reviewed history from 03/16/2008 and no changes required. [employer] in  [place]. Married to [place] and has 2 sons- 1 grown with a child, local and 6 yo son at home (having problems with). Quit smoking in 1999 after 36 pk yr hx. 5 ETOH / wk No exericse. Poor diet 14 caffeinated drinks per day!  Review of Systems      See HPI  Physical Exam  General:  alert, well-developed, well-nourished, and well-hydrated.  obese Head:  normocephalic and atraumatic.   Mouth:  pharynx pink and moist.  cryptic tonsils Neck:  no masses.   Lungs:  Normal respiratory effort, chest expands symmetrically. Lungs are clear to auscultation, no crackles or wheezes. Heart:  Normal rate and regular rhythm. S1 and S2 normal without gallop, murmur, click, rub or other extra sounds. Extremities:  no LE edema Neurologic:  no tremor Skin:  color normal.   Psych:  good eye contact, not anxious appearing, and not depressed appearing.     Impression & Recommendations:  Problem # 1:  HYPERTENSION, BENIGN ESSENTIAL (ICD-401.1) At goal.  BP at goal and labs updated today.  REviewed previous EKG. His updated medication list for this problem includes:    Triamterene-hctz 37.5-25 Mg Tabs (Triamterene-hctz) .Marland Kitchen... 1 tab by mouth daily  Orders:  T-Comprehensive Metabolic Panel [id number])  BP today: 133/86 Prior BP: 111/80 (10/02/2010)  Prior 10 Yr Risk Heart Disease: 7 % (12/16/2008)  Labs Reviewed: K+: 4.9 (12/06/2009) Creat: : 0.98 (12/06/2009)   Chol: 192 (04/13/2008)   HDL: 40 (04/13/2008)   LDL: 120 (04/13/2008)   TG: 162 (04/13/2008)  Problem # 2:  OBESITY, UNSPECIFIED (ICD-278.00) BMI 39 c/w class II obesity with complications of HTN, IFG, GERD and LBP. Discussed his willingness to change diet and add regular exercise.  Since he has struggled to cut back on dietary intake, so I think he will do well with Phentermine once daily.  Discussed common SEs and will closely monitor BP/HR.  Recheck in 1 month along with a repeat EKG.  Call if any problems.  Complete Medication  List: 1)  Omeprazole 20 Mg Cpdr (Omeprazole) .... Take one tablet by mouth once a day 2)  Triamterene-hctz 37.5-25 Mg Tabs (Triamterene-hctz) .Marland Kitchen.. 1 tab by mouth daily 3)  Wellbutrin Xl 300 Mg Xr24h-tab (Bupropion hcl) .Marland Kitchen.. 1 tab by mouth daily 4)  Fish Oil 1000 Mg Caps (Omega-3 fatty acids) .... Take 2 caps by mouth once daily 5)  Phentermine Hcl 37.5 Mg Tabs (Phentermine hcl) .Marland Kitchen.. 1 tab by mouth qam, take 30 min before breakfast  Other Orders: T-Lipid Profile ([id number]) T-PSA ([id number]) T-TSH [id number])  Patient Instructions: 1)  Start Phentermine each morning, 30 min before breakfast along with healthy diet, regular exercise(1 hr 4 x a wk). 2)  Stick to lean proteins, fruits and veggies, limiting carbs and processed foods. 3)  Return for f/u visit in 1 month with EKG Prescriptions: PHENTERMINE HCL 37.5 MG TABS (PHENTERMINE HCL) 1 tab by mouth qAM, take 30 min before breakfast  #30 x 0   Entered and Authorized by:   Seymour Bars DO   Signed by:   Seymour Bars DO on 01/09/2011   Method used:   Printed then faxed to ...       [address] [phone]* (retail)       [address] [place], [place]  [address]       Ph: [phone]       Fax: [phone]   RxID:   [id number] WELLBUTRIN XL 300 MG XR24H-TAB (BUPROPION HCL) 1 tab by mouth daily  #30 Each x 5   Entered and Authorized by:   Seymour Bars DO   Signed by:   Seymour Bars DO on 01/09/2011   Method used:   Electronically to        [facility] [phone]* (retail)       [address] [place], [place]  [address]       Ph: [phone]       Fax: [phone]   RxID:   [id number] TRIAMTERENE-HCTZ 37.5-25 MG TABS (TRIAMTERENE-HCTZ) 1 TAB by mouth DAILY  #30 Each x 5   Entered and Authorized by:   Seymour Bars DO   Signed by:   Seymour Bars DO on 01/09/2011   Method used:   Electronically to        [facility] [phone]* (retail)       [address] [place], [place]  [address]       Ph: [phone]        Fax: [phone]   RxID:   [id number]    Orders Added: 1)  T-Comprehensive Metabolic Panel [[id number]] 2)  T-Lipid Profile [[id number]] 3)  T-PSA [[id number]] 4)  T-TSH [[id number]] 5)  Est. Patient Level III [[address]]

## 2011-01-30 ENCOUNTER — Encounter: Payer: Self-pay | Admitting: Family Medicine

## 2011-01-30 LAB — CONVERTED CEMR LAB
AST: 20 units/L (ref 0–37)
Albumin: 4.6 g/dL (ref 3.5–5.2)
Alkaline Phosphatase: 79 units/L (ref 39–117)
BUN: 22 mg/dL (ref 6–23)
HDL: 37 mg/dL — ABNORMAL LOW (ref >39)
LDL Cholesterol: 133 mg/dL — ABNORMAL HIGH (ref 0–99)
Potassium: 5 meq/L (ref 3.5–5.3)
Sodium: 143 meq/L (ref 135–145)
TSH: 1.774 microintl units/mL (ref 0.350–4.500)
Total Protein: 6.8 g/dL (ref 6.0–8.3)

## 2011-02-07 ENCOUNTER — Ambulatory Visit (INDEPENDENT_AMBULATORY_CARE_PROVIDER_SITE_OTHER): Payer: PRIVATE HEALTH INSURANCE | Admitting: Family Medicine

## 2011-02-07 VITALS — BP 127/83 | HR 88 | Ht 71.0 in | Wt 280.0 lb

## 2011-02-07 DIAGNOSIS — E669 Obesity, unspecified: Secondary | ICD-10-CM

## 2011-02-07 DIAGNOSIS — I10 Essential (primary) hypertension: Secondary | ICD-10-CM

## 2011-02-07 MED ORDER — PHENTERMINE HCL 37.5 MG PO TABS: 37.5000 mg | ORAL_TABLET | Freq: Every day | ORAL | Status: DC

## 2011-02-07 NOTE — Assessment & Plan Note (Signed)
Pt is doing great on Phentermine x 1 month total along with dietary changes (portion decrease thus far) but will work on making healthier choices and exercising more.  He is down 9 lbs.  His BMI is still 39 in the class II obesity range.  His obesity is complicated by IFG, LBP, GERD, hyperlipidemia and HTN.  Will RF and have his come back in 4 wks for a nurse BP/ WT check.

## 2011-02-07 NOTE — Assessment & Plan Note (Signed)
BP looks good even with phentermine.  Continue current meds and keep working on diet, exercise and wt loss.

## 2011-02-07 NOTE — Patient Instructions (Signed)
Keep up the good work. You can take Phentermine either 30 min before breakfast and 2 hrs after dinner.  Work on [employer] and add exercise (aim for 45 min 4-5 days/ wk).  Return for nurse visit BP/ WT check in 4 wks.

## 2011-02-07 NOTE — Progress Notes (Signed)
  Subjective:    Patient ID: Bobby White, male    DOB: 1960/11/25, 50 y.o.   MRN: [id number]  HPI 50 yo WM presents for f/u obesity.  He just started walking this week.  He lost 9 lbs with the help of Phentermine for the past month.  He is trying to cut back on portion sizes but really has not changed his diet.  He denies heart palptiations or insomnia.  Has a slight tremor if he drinks too much caffeine. He is taking all of his meds.    BP 127/83  Pulse 88  Ht 5\' 11"  (1.803 m)  Wt 280 lb (127.007 kg)  BMI 39.05 kg/m2  SpO2 97%  Review of Systems denies CP, DOE.    Objective:   Physical Exam  Constitutional: He appears well-developed and well-nourished.       obese  Neck: No thyromegaly present.  Cardiovascular: Normal rate, regular rhythm and normal heart sounds.   No murmur heard. Pulmonary/Chest: Effort normal. No respiratory distress.  Musculoskeletal: He exhibits no edema.  Neurological:       No tremor  Psychiatric: He has a normal mood and affect.          Assessment & Plan:

## 2011-03-10 ENCOUNTER — Other Ambulatory Visit: Payer: Self-pay | Admitting: Family Medicine

## 2011-03-11 ENCOUNTER — Other Ambulatory Visit: Payer: Self-pay | Admitting: Family Medicine

## 2011-03-11 ENCOUNTER — Telehealth: Payer: Self-pay | Admitting: *Deleted

## 2011-03-11 NOTE — Telephone Encounter (Signed)
Sue Lush, there is no note in here.

## 2011-03-12 NOTE — Telephone Encounter (Signed)
He can go without it for 2 days.

## 2011-03-12 NOTE — Telephone Encounter (Signed)
Pt's wife, Annice Pih, left message that [facility] needs refill of Phentermine.  He has an appt on 4/26, but will not have enough pills to last until then.  His wife would share hers,but they still will not have enough to last until his appt.  They would like to know if he can have an early refill.  Annice Pih also has an appt this week.

## 2011-03-13 NOTE — Telephone Encounter (Signed)
Pt.notified

## 2011-03-14 ENCOUNTER — Ambulatory Visit (INDEPENDENT_AMBULATORY_CARE_PROVIDER_SITE_OTHER): Payer: PRIVATE HEALTH INSURANCE | Admitting: Family Medicine

## 2011-03-14 ENCOUNTER — Encounter: Payer: Self-pay | Admitting: Family Medicine

## 2011-03-14 VITALS — BP 118/73 | HR 80 | Ht 71.5 in | Wt 260.0 lb

## 2011-03-14 DIAGNOSIS — E669 Obesity, unspecified: Secondary | ICD-10-CM

## 2011-03-14 DIAGNOSIS — I10 Essential (primary) hypertension: Secondary | ICD-10-CM

## 2011-03-14 MED ORDER — PHENTERMINE HCL 37.5 MG PO TABS: 37.5000 mg | ORAL_TABLET | Freq: Every day | ORAL | Status: DC

## 2011-03-14 NOTE — Progress Notes (Signed)
  Subjective:    Patient ID: AZEKIEL CREMER, male    DOB: [birth year], 50 y.o.   MRN: [id number]  HPIPatient doing well on appetite suppressant.  Here for nurse visit, weight, BP, HR check.  Denies problems with insomnia, heart palpitations or tremors.  Satisfied with weight loss thus far and is working on [employer] and regular exercise.       Review of Systems     Objective:   Physical Exam        Assessment & Plan:  1. Obesity complicated by HTN, GERD, dyslipidemia, IFG, LBP Doing well.  He has now been on Phentermine x 3 month with BMI 39.8--> 39.05 --> 35.76.  WT down 289--> 280--> 260.  BP/ HR look good and he's not having any adverse SE.    Will RF for another month and return to recheck in 1 month.

## 2011-03-14 NOTE — Progress Notes (Signed)
  Subjective:    Patient ID: Bobby White, male    DOB: 01/30/1961, 50 y.o.   MRN: [id number]  HPI Patient doing well on appetite suppressant.  Here for nurse visit, weight, BP, HR check.  Denies problems with insomnia, heart palpitations or tremors.  Satisfied with weight loss thus far and is working on healthy diet and regular exercise.       Review of Systems     Objective:   Physical Exam        Assessment & Plan:   

## 2011-04-05 ENCOUNTER — Telehealth: Payer: Self-pay | Admitting: *Deleted

## 2011-04-05 NOTE — Telephone Encounter (Signed)
Pt aware of the above  

## 2011-04-05 NOTE — Telephone Encounter (Signed)
I have not recently seen him for poison ivy.  If it is localized to only part of his body, he can use OTC Hydrocortisone cream bid.  If it widespread, I would advise a visit to urgent care for systemic treatment.

## 2011-04-05 NOTE — Telephone Encounter (Signed)
Pt LMOM stating he has poison ivy again and would like to know if you will send Rx to pharm bc you have no apts available today

## 2011-04-08 ENCOUNTER — Telehealth: Payer: Self-pay | Admitting: Family Medicine

## 2011-04-08 ENCOUNTER — Ambulatory Visit (INDEPENDENT_AMBULATORY_CARE_PROVIDER_SITE_OTHER): Payer: PRIVATE HEALTH INSURANCE | Admitting: Family Medicine

## 2011-04-08 DIAGNOSIS — E669 Obesity, unspecified: Secondary | ICD-10-CM

## 2011-04-08 DIAGNOSIS — I10 Essential (primary) hypertension: Secondary | ICD-10-CM

## 2011-04-08 NOTE — Telephone Encounter (Signed)
Pls let pt know that his wt loss has stabilized, only 1 lb lost in the past 4 wks with BP elevated today.  Will not RF his phentermine but he does need to keep working on healthy diet and regular exercise.  I'd like to see him back for an office visit in 1 month and will recheck his fasting sugar at that time.

## 2011-04-08 NOTE — Telephone Encounter (Signed)
Pt notified that we will not refill phentermine since his BP was elevated today.  Told wt loss stabilized.   Keep working on [employer] and regular exercise.  Follow up for office visit in 1 mth and come fasting so we can check a fasting BS level at that time. Told to call office and sched OV. Jarvis Newcomer, LPN [facility]

## 2011-04-08 NOTE — Assessment & Plan Note (Signed)
BP is elevated today, on meds.  I will hold off on RFing his Phentermine given only 1 lb wt loss in the past month with high BP reading today. Will have him continue BP meds, work on healthy diet and regular exercise and see me back in 1 month.

## 2011-04-08 NOTE — Assessment & Plan Note (Signed)
Wt loss stabilizing after 4 mos of phentermine with only 1 lb lost in the past 4 wks.  He is down from a BMI of 39 at the start to 36 now and wt was 289 at the start and is down to 259 now.  Will not RF his phentermine but will have him work on diet/ exercise and see me back in 1 month.

## 2011-04-08 NOTE — Progress Notes (Signed)
  Subjective:    Patient ID: Bobby White, male    DOB: [birth year], 50 y.o.   MRN: [id number]  HPI Patient doing well on appetite suppressant.  Here for nurse visit, weight, BP, HR check.  Denies problems with insomnia, heart palpitations or tremors.  Satisfied with weight loss thus far and is working on [employer] and regular exercise.       Review of Systems     Objective:   Physical Exam        Assessment & Plan:

## 2011-05-13 ENCOUNTER — Encounter: Payer: Self-pay | Admitting: Family Medicine

## 2011-05-13 ENCOUNTER — Ambulatory Visit (INDEPENDENT_AMBULATORY_CARE_PROVIDER_SITE_OTHER): Payer: PRIVATE HEALTH INSURANCE | Admitting: Family Medicine

## 2011-05-13 VITALS — BP 127/83 | HR 75 | Ht 71.5 in | Wt 254.0 lb

## 2011-05-13 DIAGNOSIS — E785 Hyperlipidemia, unspecified: Secondary | ICD-10-CM

## 2011-05-13 DIAGNOSIS — R7301 Impaired fasting glucose: Secondary | ICD-10-CM

## 2011-05-13 DIAGNOSIS — E669 Obesity, unspecified: Secondary | ICD-10-CM

## 2011-05-13 DIAGNOSIS — I10 Essential (primary) hypertension: Secondary | ICD-10-CM

## 2011-05-13 LAB — GLUCOSE, POCT (MANUAL RESULT ENTRY): POC Glucose: 109

## 2011-05-13 MED ORDER — PHENTERMINE HCL 37.5 MG PO CAPS: 37.5000 mg | ORAL_CAPSULE | ORAL | Status: DC

## 2011-05-13 NOTE — Assessment & Plan Note (Signed)
Glucose 109 today, up from 100 last visit.  Reviewed dietary restrictions - to keep diet low sugar, low carb with higher protein and veggy intake.  Continue regular exercise and further wt loss.  Expect to see this improve with further wt loss.  Plan to recheck sugar in 6 mos.

## 2011-05-13 NOTE — Assessment & Plan Note (Signed)
BMI 34 down from 39.8 in Feb at the start of weight management plan.  He has continued to lose wt off phentermine for the past month but is struggling more to stick to his diet.  He is routinely exercising w/ his wife.  His obesity is complicated by HTN, GERD, hyperlipidemia, IFG and his labs were updated in Feb.  Agreed to RF his Phentermine given excellent BP/ HR today and recheck in1  Month but I would like to update an ETT since he is exercising more vigorously.

## 2011-05-13 NOTE — Patient Instructions (Addendum)
Stay on current meds.  BP looks great.  Restart Phentermine in the AM along with a low sugar/ low carb diet + 1 hr of exercise 5 days/ wk.  Will get an exercise treadmill test updated at [facility].  Return for a nurse visit in 1 month.

## 2011-05-13 NOTE — Progress Notes (Signed)
  Subjective:    Patient ID: Bobby White, male    DOB: Oct 19, 1961, 50 y.o.   MRN: [id number]  HPI 50 yo WM presents for f/u obesity.  He is complicated by HTN, GERD, hyperlipidemia and IFG.  Fasting sugar was 100 in Feb and 109 today.  He started Phentermine in Feb and his BMI dropped from 39.8 to 34.9 today.  He has been off Phentermine the past month due to lack of wt loss and elevated BP.  His FLP was done with CMP in Feb 2012.  He and his wife are going to the gym 4 days/ wk -- doing both cardio and weights.  He has noticed it is much harder for him to keep portion sizes down and not to over snack off the medicine.  He is making healthy choices and has continued to lose some wt off of it for the past month.  He would like to retry it.  Denies having palpitations, tremor or insomnia from it.    BP 127/83  Pulse 75  Ht 5' 11.5" (1.816 m)  Wt 254 lb (115.214 kg)  BMI 34.93 kg/m2  SpO2 96%    Review of Systems  Respiratory: Negative for shortness of breath.   Cardiovascular: Negative for chest pain and palpitations.  Psychiatric/Behavioral: Negative for sleep disturbance. The patient is not nervous/anxious.        Objective:   Physical Exam  Constitutional: He appears well-developed and well-nourished.  HENT:  Mouth/Throat: Oropharynx is clear and moist.  Neck: Neck supple. No thyromegaly present.  Cardiovascular: Normal rate, regular rhythm and normal heart sounds.   Pulmonary/Chest: Effort normal and breath sounds normal.  Musculoskeletal: He exhibits no edema.  Neurological:       No tremor  Skin: Skin is warm and dry.  Psychiatric: He has a normal mood and affect.          Assessment & Plan:

## 2011-06-12 ENCOUNTER — Other Ambulatory Visit: Payer: Self-pay | Admitting: Family Medicine

## 2011-06-14 ENCOUNTER — Ambulatory Visit (INDEPENDENT_AMBULATORY_CARE_PROVIDER_SITE_OTHER): Payer: PRIVATE HEALTH INSURANCE | Admitting: Family Medicine

## 2011-06-14 VITALS — BP 131/80 | HR 79 | Resp 20 | Ht 71.0 in | Wt 242.0 lb

## 2011-06-14 DIAGNOSIS — E669 Obesity, unspecified: Secondary | ICD-10-CM

## 2011-06-14 DIAGNOSIS — I10 Essential (primary) hypertension: Secondary | ICD-10-CM

## 2011-06-14 MED ORDER — PHENTERMINE HCL 37.5 MG PO CAPS: 37.5000 mg | ORAL_CAPSULE | ORAL | Status: DC

## 2011-06-14 NOTE — Progress Notes (Signed)
  Subjective:    Patient ID: Bobby White, male    DOB: 03-31-1961, 50 y.o.   MRN: [id number]  HPI  Patient doing well on appetite suppressant.  Here for nurse visit, weight, BP, HR check.  Denies problems with insomnia, heart palpitations or tremors.  Satisfied with weight loss thus far and is working on [employer] and regular exercise.    BP 131/80  Pulse 79  Resp 20  Ht 5\' 11"  (1.803 m)  Wt 242 lb (109.77 kg)  BMI 33.75 kg/m2  SpO2 99%   Review of Systems     Objective:   Physical Exam        Assessment & Plan:  Obesity complicated by HTN, GERD, IFG, LBP, dyslipidemia. Doing very well on Phentermine along with healthy diet and regular exercise w/o adverse SEs.  BP/ HR at goal.  Lost 12 more lbs in the past month.  Will RF.  BMI still 33.  F/u in 1 month.

## 2011-07-08 ENCOUNTER — Telehealth: Payer: Self-pay | Admitting: Family Medicine

## 2011-07-08 DIAGNOSIS — I2510 Atherosclerotic heart disease of native coronary artery without angina pectoris: Secondary | ICD-10-CM

## 2011-07-08 NOTE — Telephone Encounter (Signed)
UPDATED CHART

## 2011-07-09 ENCOUNTER — Encounter: Payer: Self-pay | Admitting: Family Medicine

## 2011-07-17 ENCOUNTER — Other Ambulatory Visit: Payer: Self-pay | Admitting: *Deleted

## 2011-07-17 MED ORDER — SIMVASTATIN 20 MG PO TABS: 20.0000 mg | ORAL_TABLET | Freq: Every day | ORAL | Status: DC

## 2011-07-18 ENCOUNTER — Ambulatory Visit: Payer: PRIVATE HEALTH INSURANCE | Admitting: Family Medicine

## 2011-07-25 HISTORY — PX: CORONARY ARTERY BYPASS GRAFT: SHX141

## 2011-08-27 ENCOUNTER — Telehealth: Payer: Self-pay | Admitting: *Deleted

## 2011-08-27 DIAGNOSIS — Z951 Presence of aortocoronary bypass graft: Secondary | ICD-10-CM | POA: Insufficient documentation

## 2011-09-06 DIAGNOSIS — I10 Essential (primary) hypertension: Secondary | ICD-10-CM | POA: Insufficient documentation

## 2011-09-06 DIAGNOSIS — Z951 Presence of aortocoronary bypass graft: Secondary | ICD-10-CM | POA: Insufficient documentation

## 2011-09-06 DIAGNOSIS — I2510 Atherosclerotic heart disease of native coronary artery without angina pectoris: Secondary | ICD-10-CM | POA: Insufficient documentation

## 2011-11-29 ENCOUNTER — Other Ambulatory Visit: Payer: Self-pay | Admitting: Family Medicine

## 2011-12-26 ENCOUNTER — Ambulatory Visit (INDEPENDENT_AMBULATORY_CARE_PROVIDER_SITE_OTHER): Payer: Managed Care, Other (non HMO) | Admitting: Family Medicine

## 2011-12-26 ENCOUNTER — Encounter: Payer: Self-pay | Admitting: Family Medicine

## 2011-12-26 VITALS — BP 102/63 | HR 67 | Ht 71.5 in | Wt 271.0 lb

## 2011-12-26 DIAGNOSIS — Z125 Encounter for screening for malignant neoplasm of prostate: Secondary | ICD-10-CM

## 2011-12-26 DIAGNOSIS — E669 Obesity, unspecified: Secondary | ICD-10-CM

## 2011-12-26 DIAGNOSIS — I2510 Atherosclerotic heart disease of native coronary artery without angina pectoris: Secondary | ICD-10-CM

## 2011-12-26 LAB — LIPID PANEL
Cholesterol: 155 mg/dL (ref 0–200)
HDL: 33 mg/dL — ABNORMAL LOW (ref >39)
Total CHOL/HDL Ratio: 4.7 Ratio
Triglycerides: 128 mg/dL (ref <150)

## 2011-12-26 NOTE — Progress Notes (Signed)
  Subjective:    Patient ID: Bobby White, male    DOB: October 13, 1961, 51 y.o.   MRN: [id number]  HPI  CAD- Has been working out doing aerobic activity 30 min for 3-4 days a week.  Not on any weight loss program.  Will have oatmeal for breakfast and then 2 peanut butter and jelly sandwhich and will have chicken with pineapple.   Some sweet peppers.  Some rice. Drinking [place] pomegranate tea ( no sugar). Switched to splenda.  Does have coffee with fat free creamer and sugr free sweetner. No CP or SOB with working out.   Review of Systems     Objective:   Physical Exam  Constitutional: He is oriented to person, place, and time. He appears well-developed and well-nourished.  HENT:  Head: Normocephalic and atraumatic.  Cardiovascular: Normal rate, regular rhythm and normal heart sounds.        No carotid bruits.   Pulmonary/Chest: Effort normal and breath sounds normal.  Neurological: He is alert and oriented to person, place, and time.  Skin: Skin is warm and dry.  Psychiatric: He has a normal mood and affect. His behavior is normal.          Assessment & Plan:  CAD - TAking daily ASA, Betblocker, and lipid.  F/U in 4 months. Discussed inc exercise to 5 days per week.  Check CMP and lipids today to make sure at goal. BP well controlled.   Obesity - Add protine at breakfast.  Decreased carbs at lunch. Adding 2 small snacks in between meals.

## 2011-12-27 LAB — COMPLETE METABOLIC PANEL WITH GFR
Alkaline Phosphatase: 77 U/L (ref 39–117)
Creat: 0.87 mg/dL (ref 0.50–1.35)
GFR, Est Non African American: >89 mL/min
Glucose, Bld: 85 mg/dL (ref 70–99)
Sodium: 140 mEq/L (ref 135–145)
Total Bilirubin: 1.2 mg/dL (ref 0.3–1.2)
Total Protein: 6.2 g/dL (ref 6.0–8.3)

## 2012-01-27 ENCOUNTER — Other Ambulatory Visit: Payer: Self-pay | Admitting: Family Medicine

## 2012-04-08 ENCOUNTER — Encounter: Payer: Self-pay | Admitting: Physician Assistant

## 2012-04-08 ENCOUNTER — Ambulatory Visit (INDEPENDENT_AMBULATORY_CARE_PROVIDER_SITE_OTHER): Payer: Managed Care, Other (non HMO) | Admitting: Physician Assistant

## 2012-04-08 ENCOUNTER — Ambulatory Visit
Admission: RE | Admit: 2012-04-08 | Discharge: 2012-04-08 | Disposition: A | Discharge status: Home / Self Care | Payer: Managed Care, Other (non HMO) | Source: Ambulatory Visit | Attending: Physician Assistant | Admitting: Physician Assistant

## 2012-04-08 VITALS — BP 126/81 | HR 74 | Temp 98.2°F | Ht 71.5 in | Wt 290.0 lb

## 2012-04-08 DIAGNOSIS — R05 Cough: Secondary | ICD-10-CM

## 2012-04-08 DIAGNOSIS — J40 Bronchitis, not specified as acute or chronic: Secondary | ICD-10-CM

## 2012-04-08 DIAGNOSIS — R059 Cough, unspecified: Secondary | ICD-10-CM

## 2012-04-08 MED ORDER — HYDROCODONE-HOMATROPINE 5-1.5 MG/5ML PO SYRP: 5.0000 mL | ORAL_SOLUTION | Freq: Every evening | ORAL | Status: AC | PRN

## 2012-04-08 MED ORDER — BENZONATATE 100 MG PO CAPS: 100.0000 mg | ORAL_CAPSULE | Freq: Three times a day (TID) | ORAL | Status: AC | PRN

## 2012-04-08 NOTE — Patient Instructions (Addendum)
Mucinex D twice a day drinking lots of water. Cough syrup for bedtime. Tessalone Pearle can take up to three times a day. If not improving in by Friday call office and we will send over antibiotic.   Acute Bronchitis You have acute bronchitis. This means you have a chest cold. The airways in your lungs are red and sore (inflamed). Acute means it is sudden onset.  CAUSES Bronchitis is most often caused by the same virus that causes a cold. SYMPTOMS   Body aches.   Chest congestion.   Chills.   Cough.   Fever.   Shortness of breath.   Sore throat.  TREATMENT  Acute bronchitis is usually treated with rest, fluids, and medicines for relief of fever or cough. Most symptoms should go away after a few days or a week. Increased fluids may help thin your secretions and will prevent dehydration. Your caregiver may give you an inhaler to improve your symptoms. The inhaler reduces shortness of breath and helps control cough. You can take over-the-counter pain relievers or cough medicine to decrease coughing, pain, or fever. A cool-air vaporizer may help thin bronchial secretions and make it easier to clear your chest. Antibiotics are usually not needed but can be prescribed if you smoke, are seriously ill, have chronic lung problems, are elderly, or you are at higher risk for developing complications.Allergies and asthma can make bronchitis worse. Repeated episodes of bronchitis may cause longstanding lung problems. Avoid smoking and secondhand smoke.Exposure to cigarette smoke or irritating chemicals will make bronchitis worse. If you are a cigarette smoker, consider using nicotine gum or skin patches to help control withdrawal symptoms. Quitting smoking will help your lungs heal faster. Recovery from bronchitis is often slow, but you should start feeling better after 2 to 3 days. Cough from bronchitis frequently lasts for 3 to 4 weeks. To prevent another bout of acute bronchitis:  Quit smoking.     Wash your hands frequently to get rid of viruses or use a hand sanitizer.   Avoid other people with cold or virus symptoms.   Try not to touch your hands to your mouth, nose, or eyes.  SEEK IMMEDIATE MEDICAL CARE IF:  You develop increased fever, chills, or chest pain.   You have severe shortness of breath or bloody sputum.   You develop dehydration, fainting, repeated vomiting, or a severe headache.   You have no improvement after 1 week of treatment or you get worse.  MAKE SURE YOU:   Understand these instructions.   Will watch your condition.   Will get help right away if you are not doing well or get worse.  Document Released: 12/12/2004 Document Revised: 10/24/2011 Document Reviewed: 02/27/2011 [facility] Patient Information 2012 [place], [place].

## 2012-04-08 NOTE — Progress Notes (Signed)
  Subjective:    Patient ID: Bobby White, male    DOB: Mar 05, 1961, 51 y.o.   MRN: [id number]  HPI Patient presents to the clinic with episodic coughing spells about every . 3 days ago he had a tickle in his throat and gradually it has started to hurt more and him coughing. He denies fever, SOB, and ear pain. He has been sneezing a lot and has a runny nose. He has tried Dayquil and Nyquil but has not helped cough or congestion. He has starting to feel very congested in his head and has had headaches last 2 days. He previously smoked but has not smoked in 15 years.    Review of Systems     Objective:   Physical Exam  Constitutional: He is oriented to person, place, and time. He appears well-developed and well-nourished.  HENT:  Head: Normocephalic and atraumatic.  Right Ear: External ear normal.  Left Ear: External ear normal.  Mouth/Throat: Oropharynx is clear and moist.       Tm's normal. Clear nasal discharge from nares. No maxillary tenderness.   Eyes: Conjunctivae are normal.  Neck: Normal range of motion. Neck supple.  Cardiovascular: Normal rate, regular rhythm and normal heart sounds.   Pulmonary/Chest: Effort normal and breath sounds normal. He has no wheezes.       Coarse breath sound heard and then cleared with cough.   Lymphadenopathy:    He has no cervical adenopathy.  Neurological: He is alert and oriented to person, place, and time.  Skin: Skin is warm and dry.  Psychiatric: He has a normal mood and affect. His behavior is normal.          Assessment & Plan:  Bronchitis- Mucinex D twice a day drinking lots of water. Cough syrup for bedtime. Tessalone Pearle can take up to three times a day. If not improving in by Friday call office and we will send over antibiotic.

## 2012-04-16 ENCOUNTER — Telehealth: Payer: Self-pay | Admitting: *Deleted

## 2012-04-16 MED ORDER — AZITHROMYCIN 250 MG PO TABS: ORAL_TABLET | ORAL | Status: AC

## 2012-04-16 NOTE — Telephone Encounter (Signed)
Zpack wil be fine.

## 2012-04-16 NOTE — Telephone Encounter (Signed)
Pt still has bad cough and is on 2nd pack of Mucinex. Had CXR on 04/08/12 and it showed no bronchitis or pneumonia. Pt states he is out of cough syrup and Jade states in her office note for him to call back if no better and she will call in abx. Please advise.

## 2012-04-24 ENCOUNTER — Ambulatory Visit (INDEPENDENT_AMBULATORY_CARE_PROVIDER_SITE_OTHER): Payer: Managed Care, Other (non HMO) | Admitting: Family Medicine

## 2012-04-24 ENCOUNTER — Encounter: Payer: Self-pay | Admitting: Family Medicine

## 2012-04-24 VITALS — BP 114/79 | HR 79 | Ht 71.5 in | Wt 285.0 lb

## 2012-04-24 DIAGNOSIS — L853 Xerosis cutis: Secondary | ICD-10-CM

## 2012-04-24 DIAGNOSIS — I2510 Atherosclerotic heart disease of native coronary artery without angina pectoris: Secondary | ICD-10-CM

## 2012-04-24 DIAGNOSIS — E785 Hyperlipidemia, unspecified: Secondary | ICD-10-CM

## 2012-04-24 DIAGNOSIS — E669 Obesity, unspecified: Secondary | ICD-10-CM

## 2012-04-24 DIAGNOSIS — R238 Other skin changes: Secondary | ICD-10-CM

## 2012-04-24 MED ORDER — SIMVASTATIN 40 MG PO TABS: 40.0000 mg | ORAL_TABLET | Freq: Every day | ORAL | Status: DC

## 2012-04-24 NOTE — Progress Notes (Signed)
  Subjective:    Patient ID: Bobby White, male    DOB: 1960/11/30, 51 y.o.   MRN: [id number]  HPI CAD- doing well overall. Has lost 5 lbs. BP is well controlled.   Hyperlipidemia - He has not been exercising. Financially had to give up gym membership. Tolerating simvastatin well.  Has had some more dry skin.  He is a little stressed right now. His mother in law had a stroke and wife is about to have back surgery.    Review of Systems     Objective:   Physical Exam  Constitutional: He is oriented to person, place, and time. He appears well-developed and well-nourished.  HENT:  Head: Normocephalic and atraumatic.  Cardiovascular: Normal rate, regular rhythm and normal heart sounds.   Pulmonary/Chest: Effort normal and breath sounds normal.  Neurological: He is alert and oriented to person, place, and time.  Skin: Skin is warm and dry.  Psychiatric: He has a normal mood and affect. His behavior is normal.          Assessment & Plan:  CAD- Taking ASA, betablocker, statin. Has lost 5 lbs.  Encouraged him ot start exercising as well. BP well controlled.    Obesity - has lost 5 pounds but is not exercising regularly. Encouraged him to do so. He did restart with a high-intensity walking program for 30 minutes 5 days per week. He is still doing well with avoiding fast food.  Dry skin - check TSH. I don't think is a med S.E.   Hyperlpidemia - will increase the simvastatin to 40mg  to get LDL uder 75.  F/U in 6 months. Has to f/u with Dr. Lorenso Courier.

## 2012-04-24 NOTE — Patient Instructions (Signed)

## 2012-04-25 LAB — TSH: TSH: 1.665 u[IU]/mL (ref 0.350–4.500)

## 2012-04-26 NOTE — Progress Notes (Signed)
Quick Note:  All labs are normal. ______ 

## 2012-09-21 ENCOUNTER — Other Ambulatory Visit: Payer: Self-pay | Admitting: *Deleted

## 2012-09-21 MED ORDER — SIMVASTATIN 40 MG PO TABS: 40.0000 mg | ORAL_TABLET | Freq: Every day | ORAL | Status: DC

## 2012-11-02 ENCOUNTER — Encounter: Payer: Self-pay | Admitting: Family Medicine

## 2012-11-02 ENCOUNTER — Ambulatory Visit (INDEPENDENT_AMBULATORY_CARE_PROVIDER_SITE_OTHER): Payer: BC Managed Care – PPO | Admitting: Family Medicine

## 2012-11-02 VITALS — BP 122/85 | HR 81 | Ht 71.0 in | Wt 281.0 lb

## 2012-11-02 DIAGNOSIS — Z23 Encounter for immunization: Secondary | ICD-10-CM

## 2012-11-02 DIAGNOSIS — E785 Hyperlipidemia, unspecified: Secondary | ICD-10-CM

## 2012-11-02 DIAGNOSIS — I2510 Atherosclerotic heart disease of native coronary artery without angina pectoris: Secondary | ICD-10-CM

## 2012-11-02 DIAGNOSIS — M549 Dorsalgia, unspecified: Secondary | ICD-10-CM

## 2012-11-02 DIAGNOSIS — Z951 Presence of aortocoronary bypass graft: Secondary | ICD-10-CM

## 2012-11-02 DIAGNOSIS — I10 Essential (primary) hypertension: Secondary | ICD-10-CM

## 2012-11-02 LAB — COMPLETE METABOLIC PANEL WITH GFR
ALT: 20 U/L (ref 0–53)
AST: 18 U/L (ref 0–37)
Alkaline Phosphatase: 85 U/L (ref 39–117)
GFR, Est Non African American: >89 mL/min
Glucose, Bld: 100 mg/dL — ABNORMAL HIGH (ref 70–99)
Potassium: 5.2 mEq/L (ref 3.5–5.3)
Sodium: 141 mEq/L (ref 135–145)
Total Bilirubin: 0.8 mg/dL (ref 0.3–1.2)
Total Protein: 6.6 g/dL (ref 6.0–8.3)

## 2012-11-02 LAB — LIPID PANEL
HDL: 43 mg/dL (ref >39)
LDL Cholesterol: 68 mg/dL (ref 0–99)
Total CHOL/HDL Ratio: 3.2 Ratio
VLDL: 27 mg/dL (ref 0–40)

## 2012-11-02 MED ORDER — SIMVASTATIN 40 MG PO TABS: 40.0000 mg | ORAL_TABLET | Freq: Every day | ORAL | Status: DC

## 2012-11-02 MED ORDER — CYCLOBENZAPRINE HCL 10 MG PO TABS: 10.0000 mg | ORAL_TABLET | Freq: Every evening | ORAL | Status: DC | PRN

## 2012-11-02 NOTE — Progress Notes (Signed)
  Subjective:    Patient ID: Bobby White, male    DOB: May 17, 1961, 51 y.o.   MRN: [id number]  HPI HTN -  Pt denies chest pain, SOB, dizziness, or heart palpitations.  Taking meds as directed w/o problems.  Denies medication side effects.    CAD - Had 3 vessel CABG a year ago. Taking 325 mg of aspirin daily. Also reports taking all of his blood pressure medications including his statin regularly.  Hyperlipidemia - Taking stating. Needs refills. No myalgias or complications   Low back pain and right neck pain- he says his back and neck have been bothering him more lately. He also describes sciatic symptoms down his right leg. He knows it's worse because he is gaining weight and knows he needs to work on losing some and working on his core muscles. Is mostly been using ibuprofen. This does provide relief for him.   Review of Systems     Objective:   Physical Exam  Constitutional: He is oriented to person, place, and time. He appears well-developed and well-nourished.  HENT:  Head: Normocephalic and atraumatic.  Cardiovascular: Normal rate, regular rhythm and normal heart sounds.        No carotid bruits. Hypertrophic scarring of his incision over his chest.  Pulmonary/Chest: Effort normal and breath sounds normal.  Neurological: He is alert and oriented to person, place, and time.  Skin: Skin is warm and dry.  Psychiatric: He has a normal mood and affect. His behavior is normal.          Assessment & Plan:  CAD - on aspirin, beta blocker, statin. He is doing well. It is scarring I recommend silicone sheets. He would have to use them for months but does help with the hypertrophic scar formation. F/U with cards in 6 months.     HTN - blood pressure well controlled. Repeat pressure was fantastic. Followup in 6 months. Continue current regimen. He is asymptomatic from his medications.  Hyperlipidemia - due to recheck levels. Well-controlled on current regimen.  Back pain-given a  trial of Flexeril. Only to use at bedtime. Warned about sedation avoid driving and working with heavy machinery et [employer]. Work on weight loss and exercise regimen.  If not getting better or back to baseline then f/u.

## 2013-02-01 ENCOUNTER — Telehealth: Payer: Self-pay | Admitting: *Deleted

## 2013-02-01 NOTE — Telephone Encounter (Signed)
Please tell him I am not sure if has any long term effects on the heart.   I would be happy to refer him to a nutritionist if he would like.

## 2013-02-01 NOTE — Telephone Encounter (Signed)
Patient calls and states he had open heart surgery about 1 1/2 years ago and been trying to loose weight and seems to only be bulking up with training and wants to start the Garcinia Cambogia but wanted to know if this was ok to take and would not effect his heart. Please advise

## 2013-02-02 ENCOUNTER — Telehealth: Payer: Self-pay | Admitting: *Deleted

## 2013-02-02 NOTE — Telephone Encounter (Signed)
LMOM of MD instructions. Instructed to call back if would like a referral. Barry Dienes, LPN

## 2013-02-02 NOTE — Telephone Encounter (Signed)
It should not interfere with his meds.

## 2013-02-02 NOTE — Telephone Encounter (Signed)
Left detailed message on machine.

## 2013-02-02 NOTE — Telephone Encounter (Signed)
Pt calls & states that he takes garcenia cambogia?? For appetite suppressing & he states that it has some potassium in it as well.  Pt wants to know if this will interfere with his meds? Please advise.

## 2013-03-19 ENCOUNTER — Ambulatory Visit (INDEPENDENT_AMBULATORY_CARE_PROVIDER_SITE_OTHER): Payer: BC Managed Care – PPO | Admitting: Sports Medicine

## 2013-03-19 ENCOUNTER — Ambulatory Visit (INDEPENDENT_AMBULATORY_CARE_PROVIDER_SITE_OTHER): Payer: BC Managed Care – PPO

## 2013-03-19 ENCOUNTER — Encounter: Payer: Self-pay | Admitting: Sports Medicine

## 2013-03-19 VITALS — BP 137/81 | HR 77 | Wt 292.0 lb

## 2013-03-19 DIAGNOSIS — M170 Bilateral primary osteoarthritis of knee: Secondary | ICD-10-CM | POA: Insufficient documentation

## 2013-03-19 DIAGNOSIS — M25561 Pain in right knee: Secondary | ICD-10-CM

## 2013-03-19 DIAGNOSIS — M25569 Pain in unspecified knee: Secondary | ICD-10-CM

## 2013-03-19 MED ORDER — MELOXICAM 15 MG PO TABS: ORAL_TABLET | ORAL | Status: DC

## 2013-03-19 NOTE — Patient Instructions (Addendum)
Hip Rehabilitation Protocol:  1.  Side leg raises.  3x30 with no weight, then 3x15 with 2 lb ankle weight, then 3x15 with 5 lb ankle weight 

## 2013-03-19 NOTE — Progress Notes (Signed)
Subjective:    CC: Knee pain  HPI: This is a very pleasant 52 year old male with a long history of years of pain he localizes at both joint lines of both knees. He denies any injuries, right knee is worse, he gets occasional swelling, pain with any type of weightbearing, as well as gelling are present. He denies any other mechanical symptoms but he does get occasional buckling. He wants to get back into running, pain is moderate to severe. It does keep him up at night, has used some ibuprofen Aleve, but no effect.  Past medical history, Surgical history, Family history not pertinant except as noted below, Social history, Allergies, and medications have been entered into the medical record, reviewed, and no changes needed.   Review of Systems: No headache, visual changes, nausea, vomiting, diarrhea, constipation, dizziness, abdominal pain, skin rash, fevers, chills, night sweats, weight loss, swollen lymph nodes, body aches, joint swelling, muscle aches, chest pain, shortness of breath, mood changes, visual or auditory hallucinations.   Objective:   General: Well Developed, well nourished, and in no acute distress.  Neuro/Psych: Alert and oriented x3, extra-ocular muscles intact, able to move all 4 extremities, sensation grossly intact. Skin: Warm and dry, no rashes noted.  Respiratory: Not using accessory muscles, speaking in full sentences, trachea midline.  Cardiovascular: Pulses palpable, no extremity edema. Abdomen: Does not appear distended. Bilateral Knee: Right knee appears somewhat full. Tender palpation of medial and lateral joint lines on both sides. ROM full in flexion and extension and lower leg rotation. Ligaments with solid consistent endpoints including ACL, PCL, LCL, MCL. Negative Mcmurray's, Apley's, and Thessalonian tests. Non painful patellar compression. Patellar glide without crepitus. Patellar and quadriceps tendons unremarkable. Hamstring strength is normal.    Quadriceps are weak on the right side.  Procedure: Real-time Ultrasound Guided Injection of left knee Device: GE Logiq E  Ultrasound guided injection is preferred based studies that show increased duration, increased effect, greater accuracy, decreased procedural pain, increased response rate, and decreased cost with ultrasound guided versus blind injection.  Verbal informed consent obtained.  Time-out conducted.  Noted no overlying erythema, induration, or other signs of local infection.  Skin prepped in a sterile fashion.  Local anesthesia: Topical Ethyl chloride.  With sterile technique and under real time ultrasound guidance:  2 cc Kenalog 40, 4 cc lidocaine injected into the suprapatellar recess. Completed without difficulty  Pain immediately resolved suggesting accurate placement of the medication.  Advised to call if fevers/chills, erythema, induration, drainage, or persistent bleeding.  Images permanently stored and available for review in the ultrasound unit.  Impression: Technically successful ultrasound guided injection.  Procedure: Real-time Ultrasound Guided Injection of right knee Device: GE Logiq E  Ultrasound guided injection is preferred based studies that show increased duration, increased effect, greater accuracy, decreased procedural pain, increased response rate, and decreased cost with ultrasound guided versus blind injection.  Verbal informed consent obtained.  Time-out conducted.  Noted no overlying erythema, induration, or other signs of local infection.  Skin prepped in a sterile fashion.  Local anesthesia: Topical Ethyl chloride.  With sterile technique and under real time ultrasound guidance:  2 cc Kenalog 40, 4 cc lidocaine injected into the suprapatellar recess. Completed without difficulty  Pain immediately resolved suggesting accurate placement of the medication.  Advised to call if fevers/chills, erythema, induration, drainage, or persistent bleeding.   Images permanently stored and available for review in the ultrasound unit.  Impression: Technically successful ultrasound guided injection.  X-rays were interpreted  by me, they show moderate medial joint space narrowing, worse on the left side suggestive of osteoarthritis.  Impression and Recommendations:   This case required medical decision making of moderate complexity.

## 2013-03-19 NOTE — Assessment & Plan Note (Signed)
Likely degenerative joint disease with degenerative meniscal tears. X-rays. Ultrasound guided injection bilaterally as above. Home rehabilitation given. Mobic. Return in 4 weeks.

## 2013-04-23 ENCOUNTER — Ambulatory Visit (INDEPENDENT_AMBULATORY_CARE_PROVIDER_SITE_OTHER): Payer: BC Managed Care – PPO | Admitting: Sports Medicine

## 2013-04-23 ENCOUNTER — Encounter: Payer: Self-pay | Admitting: Sports Medicine

## 2013-04-23 VITALS — BP 122/78 | HR 74 | Wt 293.0 lb

## 2013-04-23 DIAGNOSIS — M7660 Achilles tendinitis, unspecified leg: Secondary | ICD-10-CM

## 2013-04-23 DIAGNOSIS — M25569 Pain in unspecified knee: Secondary | ICD-10-CM

## 2013-04-23 DIAGNOSIS — M545 Low back pain, unspecified: Secondary | ICD-10-CM | POA: Insufficient documentation

## 2013-04-23 DIAGNOSIS — M7662 Achilles tendinitis, left leg: Secondary | ICD-10-CM

## 2013-04-23 DIAGNOSIS — M25561 Pain in right knee: Secondary | ICD-10-CM

## 2013-04-23 DIAGNOSIS — IMO0001 Reserved for inherently not codable concepts without codable children: Secondary | ICD-10-CM

## 2013-04-23 MED ORDER — NITROGLYCERIN 0.2 MG/HR TD PT24: MEDICATED_PATCH | TRANSDERMAL | Status: DC

## 2013-04-23 NOTE — Progress Notes (Signed)
  Subjective:    CC: Followup  HPI: Bilateral knee osteoarthritis:  70% improved after bilateral injection, wondering what we can do next. He continues to be localized to the medial joint line, worse with ambulation, no radiation, mild to moderate.  Low back pain: Localized in the left and right quadratus lumborum with palpable spasm, pain is worse with flexion, is axial and does not radiate.  Left heel pain: For the past few months has noted a nodule in his Achilles tendon causing pain. Pain is localized, doesn't radiate, moderate.  Past medical history, Surgical history, Family history not pertinant except as noted below, Social history, Allergies, and medications have been entered into the medical record, reviewed, and no changes needed.   Review of Systems: No fevers, chills, night sweats, weight loss, chest pain, or shortness of breath.   Objective:    General: Well Developed, well nourished, and in no acute distress.  Neuro: Alert and oriented x3, extra-ocular muscles intact, sensation grossly intact.  HEENT: Normocephalic, atraumatic, pupils equal round reactive to light, neck supple, no masses, no lymphadenopathy, thyroid nonpalpable.  Skin: Warm and dry, no rashes. Cardiac: Regular rate and rhythm, no murmurs rubs or gallops, no lower extremity edema.  Respiratory: Clear to auscultation bilaterally. Not using accessory muscles, speaking in full sentences. Left Ankle: Visible fusiform swelling of the mid Achilles tendon. There is tenderness to palpation here. Negative Thompson's test. Range of motion is full in all directions. Strength is 5/5 in all directions. Stable lateral and medial ligaments; squeeze test and kleiger test unremarkable; Talar dome nontender; No pain at base of 5th MT; No tenderness over cuboid; No tenderness over N spot or navicular prominence No tenderness on posterior aspects of lateral and medial malleolus No sign of peroneal tendon subluxations or  tenderness to palpation Negative tarsal tunnel tinel's Able to walk 4 steps.  Back Exam:  Inspection: Unremarkable  Motion: Flexion 45 deg, Extension 45 deg, Side Bending to 45 deg bilaterally,  Rotation to 45 deg bilaterally  SLR laying: Negative  XSLR laying: Negative  Palpable tenderness: There are 2 discrete areas of tenderness to palpation with palpable muscle spasm over the quadratus lumborum bilaterally. FABER: negative. Sensory change: Gross sensation intact to all lumbar and sacral dermatomes.  Reflexes: 2+ at both patellar tendons, 2+ at achilles tendons, Babinski's downgoing.  Strength at foot  Plantar-flexion: 5/5 Dorsi-flexion: 5/5 Eversion: 5/5 Inversion: 5/5  Leg strength  Quad: 5/5 Hamstring: 5/5 Hip flexor: 5/5 Hip abductors: 5/5  Gait unremarkable.  Procedure:  Injection of left and right trigger points. Consent obtained and verified. Time-out conducted. Noted no overlying erythema, induration, or other signs of local infection. Skin prepped in a sterile fashion. Topical analgesic spray: Ethyl chloride. Completed without difficulty. Meds: 0.5 cc Kenalog 40, 0.5 cc lidocaine injected in a fanlike pattern into the spasming muscles in the quadratus lumborum. This is repeated on both sides. Pain immediately improved suggesting accurate placement of the medication. Advised to call if fevers/chills, erythema, induration, drainage, or persistent bleeding.  Impression and Recommendations:

## 2013-04-23 NOTE — Assessment & Plan Note (Signed)
Physical therapy, heel lift, nitroglycerin patches. Return in 4 weeks for this. Expect 6-8 weeks to resolve.

## 2013-04-23 NOTE — Patient Instructions (Addendum)
Achilles Tendinitis  Tendinitis a swelling and soreness of the tendon. The pain in the tendon (cord-like structure which attaches muscle to bone) is produced by tiny tears and the inflammation present in that tendon. It commonly occurs at the shoulders, heels, and elbows. It is usually caused by overusing the tendon and joint involved. Achilles tendinitis involves the Achilles tendon. This is the large tendon in the back of the leg just above the foot. It attaches the large muscles of the lower leg to the heel bone (called calcaneus).   This diagnosis (learning what is wrong) is made by examination. X-rays will be generally be normal if only tendinitis is present.  HOME CARE INSTRUCTIONS    Apply ice to the injury for 15-20 minutes, 3-4 times per day. Put the ice in a plastic bag and place a towel between the bag of ice and your skin.   Try to avoid use other than gentle range of motion while the tendon is painful. Do not resume use until instructed by your caregiver. Then begin use gradually. Do not increase use to the point of pain. If pain does develop, decrease use and continue the above measures. Gradually increase activities that do not cause discomfort until you gradually achieve normal use.   Only take over-the-counter or prescription medicines for pain, discomfort, or fever as directed by your caregiver.  SEEK MEDICAL CARE IF:    Your pain and swelling increase or pain is uncontrolled with medications.   You develop new, unexplained problems (symptoms) or an increase of the symptoms that brought you to your caregiver.   You develop an inability to move your toes or foot, develop warmth and swelling in your foot, or begin running an unexplained temperature.  MAKE SURE YOU:    Understand these instructions.   Will watch your condition.   Will get help right away if you are not doing well or get worse.  Document Released: 08/14/2005 Document Revised: 01/27/2012 Document Reviewed: 06/22/2008  [employer]  Patient Information 2014 [employer].

## 2013-04-23 NOTE — Assessment & Plan Note (Signed)
70% improved with bilateral knee injection. We're going to get him set up for Supartz. He can come back to see me as if this is approved to start the series.

## 2013-04-23 NOTE — Assessment & Plan Note (Signed)
Trigger point injection as above.

## 2013-04-25 ENCOUNTER — Encounter: Payer: Self-pay | Admitting: Family Medicine

## 2013-04-26 ENCOUNTER — Ambulatory Visit (INDEPENDENT_AMBULATORY_CARE_PROVIDER_SITE_OTHER): Payer: BC Managed Care – PPO | Admitting: Family Medicine

## 2013-04-26 ENCOUNTER — Encounter: Payer: Self-pay | Admitting: Family Medicine

## 2013-04-26 ENCOUNTER — Telehealth: Payer: Self-pay | Admitting: Family Medicine

## 2013-04-26 VITALS — BP 126/66 | HR 66 | Ht 71.75 in | Wt 289.0 lb

## 2013-04-26 DIAGNOSIS — I10 Essential (primary) hypertension: Secondary | ICD-10-CM

## 2013-04-26 DIAGNOSIS — R7301 Impaired fasting glucose: Secondary | ICD-10-CM

## 2013-04-26 DIAGNOSIS — E785 Hyperlipidemia, unspecified: Secondary | ICD-10-CM

## 2013-04-26 DIAGNOSIS — IMO0001 Reserved for inherently not codable concepts without codable children: Secondary | ICD-10-CM

## 2013-04-26 DIAGNOSIS — R635 Abnormal weight gain: Secondary | ICD-10-CM

## 2013-04-26 DIAGNOSIS — I2510 Atherosclerotic heart disease of native coronary artery without angina pectoris: Secondary | ICD-10-CM

## 2013-04-26 LAB — COMPLETE METABOLIC PANEL WITH GFR
ALT: 17 U/L (ref 0–53)
AST: 14 U/L (ref 0–37)
Albumin: 4.8 g/dL (ref 3.5–5.2)
CO2: 27 mEq/L (ref 19–32)
Calcium: 9.6 mg/dL (ref 8.4–10.5)
Chloride: 101 mEq/L (ref 96–112)
Creat: 0.89 mg/dL (ref 0.50–1.35)
Potassium: 4.9 mEq/L (ref 3.5–5.3)
Sodium: 140 mEq/L (ref 135–145)
Total Protein: 7.1 g/dL (ref 6.0–8.3)

## 2013-04-26 LAB — LIPID PANEL
LDL Cholesterol: 76 mg/dL (ref 0–99)
Triglycerides: 70 mg/dL (ref <150)
VLDL: 14 mg/dL (ref 0–40)

## 2013-04-26 LAB — HEMOGLOBIN A1C: Hgb A1c MFr Bld: 5.5 % (ref <5.7)

## 2013-04-26 NOTE — Progress Notes (Signed)
Quick Note:  All labs are normal. ______ 

## 2013-04-26 NOTE — Progress Notes (Signed)
  Subjective:    Patient ID: Bobby White, male    DOB: 1960/11/21, 52 y.o.   MRN: [id number]  HPI He really wants to have something to curb his appetite and work on weight loss. Has been working out regularly and has been using protein. Knows he can't use phentermine bc of CAD and CABG.    HTN- Pt denies chest pain, SOB, dizziness, or heart palpitations.  Taking meds as directed w/o problems.  Denies medication side effects.    Hyperlipidemia - Tolerating statin well.  No problems.    IFG - no inc thrist or urination.    Review of Systems     Objective:   Physical Exam  Constitutional: He is oriented to person, place, and time. He appears well-developed and well-nourished.  HENT:  Head: Normocephalic and atraumatic.  Cardiovascular: Normal rate, regular rhythm and normal heart sounds.   Pulmonary/Chest: Effort normal and breath sounds normal.  Musculoskeletal: He exhibits no edema.  Neurological: He is alert and oriented to person, place, and time.  Skin: Skin is warm and dry.  Psychiatric: He has a normal mood and affect. His behavior is normal.          Assessment & Plan:  Obesity- will try Belviq if ok with his cardiologist.  Will contact their office.   HTN- WEll controlled at goal. F/U in 6 mo.    Hyperlipidemia - wil recheck liver and lipids.   IFG - Will check A1C. Will call with results.

## 2013-04-26 NOTE — Telephone Encounter (Signed)
Call pt: Please call his cardiologist, Dr. Marcha Solders, and see if he is okay with him starting Belviq for weight loss. It is a non-stimulant medication that can help suppress hunger through the brain. Similar to an SSRI.

## 2013-04-26 NOTE — Telephone Encounter (Signed)
Called [facility] and LM for Dr. Lorenso Courier assistant to return my call. Barry Dienes, LPN

## 2013-04-27 NOTE — Telephone Encounter (Signed)
LM on Darlene VM to return my call. Barry Dienes, LPN

## 2013-04-28 ENCOUNTER — Ambulatory Visit: Payer: BC Managed Care – PPO | Admitting: Physical Therapy

## 2013-04-28 ENCOUNTER — Telehealth: Payer: Self-pay | Admitting: *Deleted

## 2013-04-28 DIAGNOSIS — M2560 Stiffness of unspecified joint, not elsewhere classified: Secondary | ICD-10-CM

## 2013-04-28 DIAGNOSIS — M2550 Pain in unspecified joint: Secondary | ICD-10-CM

## 2013-04-28 DIAGNOSIS — M6281 Muscle weakness (generalized): Secondary | ICD-10-CM

## 2013-04-28 DIAGNOSIS — M7660 Achilles tendinitis, unspecified leg: Secondary | ICD-10-CM

## 2013-04-28 DIAGNOSIS — R609 Edema, unspecified: Secondary | ICD-10-CM

## 2013-04-28 NOTE — Telephone Encounter (Signed)
Pt's insurance doesn't require PA for Supartz injection.  Meyer Cory, LPN

## 2013-04-28 NOTE — Telephone Encounter (Signed)
Call pt and let him know.  See if he is interested in seeing Dr. Seymour Bars ( he i think used to be her patient) for weight loss

## 2013-04-28 NOTE — Telephone Encounter (Signed)
Darlene with Dr. Lorenso Courier office calls back and states that Dr. Lorenso Courier said all appetite medicines are contraindicated. Barry Dienes, LPN

## 2013-04-29 NOTE — Telephone Encounter (Signed)
Left detailed message of MD instructions and advised pt to call office back and let us know what he would like to do. Barry Dienes, LPN

## 2013-04-29 NOTE — Telephone Encounter (Signed)
Pt returns call and states that he would like to see Dr. Cathey Endow for weight loss

## 2013-04-29 NOTE — Telephone Encounter (Signed)
Referral placed.

## 2013-04-30 ENCOUNTER — Encounter: Payer: BC Managed Care – PPO | Admitting: Physical Therapy

## 2013-04-30 DIAGNOSIS — M2550 Pain in unspecified joint: Secondary | ICD-10-CM

## 2013-04-30 DIAGNOSIS — M6281 Muscle weakness (generalized): Secondary | ICD-10-CM

## 2013-04-30 DIAGNOSIS — M2560 Stiffness of unspecified joint, not elsewhere classified: Secondary | ICD-10-CM

## 2013-04-30 DIAGNOSIS — M7660 Achilles tendinitis, unspecified leg: Secondary | ICD-10-CM

## 2013-04-30 DIAGNOSIS — R609 Edema, unspecified: Secondary | ICD-10-CM

## 2013-05-03 ENCOUNTER — Ambulatory Visit: Payer: BC Managed Care – PPO | Admitting: Family Medicine

## 2013-05-03 ENCOUNTER — Encounter: Payer: BC Managed Care – PPO | Admitting: Physical Therapy

## 2013-05-03 DIAGNOSIS — M6281 Muscle weakness (generalized): Secondary | ICD-10-CM

## 2013-05-03 DIAGNOSIS — M2550 Pain in unspecified joint: Secondary | ICD-10-CM

## 2013-05-03 DIAGNOSIS — M7660 Achilles tendinitis, unspecified leg: Secondary | ICD-10-CM

## 2013-05-03 DIAGNOSIS — M2560 Stiffness of unspecified joint, not elsewhere classified: Secondary | ICD-10-CM

## 2013-05-03 DIAGNOSIS — R609 Edema, unspecified: Secondary | ICD-10-CM

## 2013-05-06 ENCOUNTER — Encounter: Payer: BC Managed Care – PPO | Admitting: Physical Therapy

## 2013-05-06 DIAGNOSIS — M2560 Stiffness of unspecified joint, not elsewhere classified: Secondary | ICD-10-CM

## 2013-05-06 DIAGNOSIS — M2550 Pain in unspecified joint: Secondary | ICD-10-CM

## 2013-05-06 DIAGNOSIS — R609 Edema, unspecified: Secondary | ICD-10-CM

## 2013-05-06 DIAGNOSIS — M6281 Muscle weakness (generalized): Secondary | ICD-10-CM

## 2013-05-10 ENCOUNTER — Encounter: Payer: BC Managed Care – PPO | Admitting: Physical Therapy

## 2013-05-10 DIAGNOSIS — M2550 Pain in unspecified joint: Secondary | ICD-10-CM

## 2013-05-10 DIAGNOSIS — M7660 Achilles tendinitis, unspecified leg: Secondary | ICD-10-CM

## 2013-05-10 DIAGNOSIS — R609 Edema, unspecified: Secondary | ICD-10-CM

## 2013-05-10 DIAGNOSIS — M6281 Muscle weakness (generalized): Secondary | ICD-10-CM

## 2013-05-10 DIAGNOSIS — M2560 Stiffness of unspecified joint, not elsewhere classified: Secondary | ICD-10-CM

## 2013-05-13 ENCOUNTER — Encounter: Payer: BC Managed Care – PPO | Admitting: Physical Therapy

## 2013-05-13 DIAGNOSIS — M6281 Muscle weakness (generalized): Secondary | ICD-10-CM

## 2013-05-13 DIAGNOSIS — M7660 Achilles tendinitis, unspecified leg: Secondary | ICD-10-CM

## 2013-05-13 DIAGNOSIS — M2550 Pain in unspecified joint: Secondary | ICD-10-CM

## 2013-05-13 DIAGNOSIS — M2560 Stiffness of unspecified joint, not elsewhere classified: Secondary | ICD-10-CM

## 2013-05-13 DIAGNOSIS — R609 Edema, unspecified: Secondary | ICD-10-CM

## 2013-05-17 ENCOUNTER — Encounter: Payer: BC Managed Care – PPO | Admitting: Physical Therapy

## 2013-05-20 ENCOUNTER — Encounter: Payer: BC Managed Care – PPO | Admitting: Physical Therapy

## 2013-05-24 ENCOUNTER — Encounter: Payer: Self-pay | Admitting: Sports Medicine

## 2013-05-24 ENCOUNTER — Ambulatory Visit (INDEPENDENT_AMBULATORY_CARE_PROVIDER_SITE_OTHER): Payer: BC Managed Care – PPO | Admitting: Sports Medicine

## 2013-05-24 VITALS — BP 136/85 | HR 81 | Wt 296.0 lb

## 2013-05-24 DIAGNOSIS — M25562 Pain in left knee: Secondary | ICD-10-CM

## 2013-05-24 DIAGNOSIS — M25561 Pain in right knee: Secondary | ICD-10-CM

## 2013-05-24 DIAGNOSIS — M25569 Pain in unspecified knee: Secondary | ICD-10-CM

## 2013-05-24 NOTE — Assessment & Plan Note (Signed)
Bilateral steroid and Supartz injections as above. Return in one week for Supartz injections #2 of 5 into both knees.Marland Kitchen

## 2013-05-24 NOTE — Progress Notes (Signed)
   Procedure: Real-time Ultrasound Guided Injection of left knee Device: GE Logiq E  Verbal informed consent obtained.  Time-out conducted.  Noted no overlying erythema, induration, or other signs of local infection.  Skin prepped in a sterile fashion.  Local anesthesia: Topical Ethyl chloride.  With sterile technique and under real time ultrasound guidance:  2 cc Kenalog 40, 4 cc lidocaine injected into the left knee, suprapatellar recess through 22-gauge needle, syringe switched and 25 mg/2.5 mL of Supartz (sodium hyaluronate) in a prefilled syringe was injected easily into the knee through a 22-gauge needle. Completed without difficulty  Pain immediately resolved suggesting accurate placement of the medication.  Advised to call if fevers/chills, erythema, induration, drainage, or persistent bleeding.  Images permanently stored and available for review in the ultrasound unit.  Impression: Technically successful ultrasound guided injection.  Procedure: Real-time Ultrasound Guided Injection of right knee Device: GE Logiq E  Verbal informed consent obtained.  Time-out conducted.  Noted no overlying erythema, induration, or other signs of local infection.  Skin prepped in a sterile fashion.  Local anesthesia: Topical Ethyl chloride.  With sterile technique and under real time ultrasound guidance:  2 cc Kenalog 40, 4 cc lidocaine injected into the suprapatellar recess, then syringe switched and 25 mg/2.5 mL of Supartz (sodium hyaluronate) in a prefilled syringe was injected easily into the knee through a 22-gauge needle. Completed without difficulty  Pain immediately resolved suggesting accurate placement of the medication.  Advised to call if fevers/chills, erythema, induration, drainage, or persistent bleeding.  Images permanently stored and available for review in the ultrasound unit.  Impression: Technically successful ultrasound guided injection.

## 2013-05-27 ENCOUNTER — Encounter: Payer: BC Managed Care – PPO | Admitting: Physical Therapy

## 2013-05-27 DIAGNOSIS — M6281 Muscle weakness (generalized): Secondary | ICD-10-CM

## 2013-05-27 DIAGNOSIS — M2560 Stiffness of unspecified joint, not elsewhere classified: Secondary | ICD-10-CM

## 2013-05-27 DIAGNOSIS — R609 Edema, unspecified: Secondary | ICD-10-CM

## 2013-05-27 DIAGNOSIS — M2550 Pain in unspecified joint: Secondary | ICD-10-CM

## 2013-05-27 DIAGNOSIS — M7660 Achilles tendinitis, unspecified leg: Secondary | ICD-10-CM

## 2013-05-31 ENCOUNTER — Encounter: Payer: Self-pay | Admitting: Sports Medicine

## 2013-05-31 ENCOUNTER — Ambulatory Visit (INDEPENDENT_AMBULATORY_CARE_PROVIDER_SITE_OTHER): Payer: BC Managed Care – PPO | Admitting: Sports Medicine

## 2013-05-31 VITALS — BP 141/84 | HR 79 | Wt 291.0 lb

## 2013-05-31 DIAGNOSIS — M25561 Pain in right knee: Secondary | ICD-10-CM

## 2013-05-31 DIAGNOSIS — M25569 Pain in unspecified knee: Secondary | ICD-10-CM

## 2013-05-31 DIAGNOSIS — M25562 Pain in left knee: Secondary | ICD-10-CM

## 2013-05-31 NOTE — Progress Notes (Signed)
  Procedure: Real-time Ultrasound Guided Injection of left knee Device: GE Logiq E  Verbal informed consent obtained.  Time-out conducted.  Noted no overlying erythema, induration, or other signs of local infection.  Skin prepped in a sterile fashion.  Local anesthesia: Topical Ethyl chloride.  With sterile technique and under real time ultrasound guidance:  25 mg/2.5 mL of Supartz (sodium hyaluronate) in a prefilled syringe was injected easily into the knee through a 22-gauge needle. Completed without difficulty  Pain immediately resolved suggesting accurate placement of the medication.  Advised to call if fevers/chills, erythema, induration, drainage, or persistent bleeding.  Images permanently stored and available for review in the ultrasound unit.  Impression: Technically successful ultrasound guided injection.  Procedure: Real-time Ultrasound Guided Injection of right knee Device: GE Logiq E  Verbal informed consent obtained.  Time-out conducted.  Noted no overlying erythema, induration, or other signs of local infection.  Skin prepped in a sterile fashion.  Local anesthesia: Topical Ethyl chloride.  With sterile technique and under real time ultrasound guidance:  25 mg/2.5 mL of Supartz (sodium hyaluronate) in a prefilled syringe was injected easily into the knee through a 22-gauge needle. Completed without difficulty  Pain immediately resolved suggesting accurate placement of the medication.  Advised to call if fevers/chills, erythema, induration, drainage, or persistent bleeding.  Images permanently stored and available for review in the ultrasound unit.  Impression: Technically successful ultrasound guided injection. 

## 2013-05-31 NOTE — Assessment & Plan Note (Signed)
Supartz injections into both knees. Return in one week for #3 of 5

## 2013-06-08 ENCOUNTER — Encounter: Payer: Self-pay | Admitting: Sports Medicine

## 2013-06-08 ENCOUNTER — Ambulatory Visit (INDEPENDENT_AMBULATORY_CARE_PROVIDER_SITE_OTHER): Payer: BC Managed Care – PPO | Admitting: Sports Medicine

## 2013-06-08 ENCOUNTER — Ambulatory Visit: Payer: BC Managed Care – PPO | Admitting: Sports Medicine

## 2013-06-08 VITALS — BP 130/82 | HR 84 | Wt 293.0 lb

## 2013-06-08 DIAGNOSIS — IMO0002 Reserved for concepts with insufficient information to code with codable children: Secondary | ICD-10-CM

## 2013-06-08 DIAGNOSIS — M1710 Unilateral primary osteoarthritis, unspecified knee: Secondary | ICD-10-CM

## 2013-06-08 DIAGNOSIS — M170 Bilateral primary osteoarthritis of knee: Secondary | ICD-10-CM

## 2013-06-08 NOTE — Assessment & Plan Note (Addendum)
Supartz injections into both knees. Return in one week for #4 of 5 Was having some effusion in the right knee which is a common side effect from support, I placed some steroid here. Knee brace given.

## 2013-06-08 NOTE — Progress Notes (Signed)
  Procedure: Real-time Ultrasound Guided Injection of left knee  Device: GE Logiq E  Verbal informed consent obtained.  Time-out conducted.  Noted no overlying erythema, induration, or other signs of local infection.  Skin prepped in a sterile fashion.  Local anesthesia: Topical Ethyl chloride.  With sterile technique and under real time ultrasound guidance: 25 mg/2.5 mL of Supartz (sodium hyaluronate) in a prefilled syringe was injected easily into the knee through a 22-gauge needle.  Completed without difficulty  Pain immediately resolved suggesting accurate placement of the medication.  Advised to call if fevers/chills, erythema, induration, drainage, or persistent bleeding.  Images permanently stored and available for review in the ultrasound unit.  Impression: Technically successful ultrasound guided injection.   Procedure: Real-time Ultrasound Guided Injection of right knee  Device: GE Logiq E  Verbal informed consent obtained.  Time-out conducted.  Noted no overlying erythema, induration, or other signs of local infection.  Skin prepped in a sterile fashion.  Local anesthesia: Topical Ethyl chloride.  With sterile technique and under real time ultrasound guidance: 2 cc Kenalog 40 and 4 cc lidocaine was injected into the suprapatellar recess, syringe switched and 25 mg/2.5 mL of Supartz (sodium hyaluronate) in a prefilled syringe was injected easily into the knee through a 22-gauge needle.  Completed without difficulty  Pain immediately resolved suggesting accurate placement of the medication.  Advised to call if fevers/chills, erythema, induration, drainage, or persistent bleeding.  Images permanently stored and available for review in the ultrasound unit.  Impression: Technically successful ultrasound guided injection.

## 2013-06-10 ENCOUNTER — Ambulatory Visit: Payer: BC Managed Care – PPO | Admitting: Sports Medicine

## 2013-06-14 ENCOUNTER — Ambulatory Visit (INDEPENDENT_AMBULATORY_CARE_PROVIDER_SITE_OTHER): Payer: BC Managed Care – PPO | Admitting: Sports Medicine

## 2013-06-14 ENCOUNTER — Encounter: Payer: Self-pay | Admitting: Sports Medicine

## 2013-06-14 VITALS — BP 130/84 | HR 83 | Wt 290.0 lb

## 2013-06-14 DIAGNOSIS — M1710 Unilateral primary osteoarthritis, unspecified knee: Secondary | ICD-10-CM

## 2013-06-14 DIAGNOSIS — M170 Bilateral primary osteoarthritis of knee: Secondary | ICD-10-CM

## 2013-06-14 DIAGNOSIS — IMO0002 Reserved for concepts with insufficient information to code with codable children: Secondary | ICD-10-CM

## 2013-06-14 MED ORDER — HYDROCODONE-ACETAMINOPHEN 10-325 MG PO TABS: 1.0000 | ORAL_TABLET | Freq: Three times a day (TID) | ORAL | Status: DC | PRN

## 2013-06-14 MED ORDER — TRAMADOL HCL 50 MG PO TABS: 50.0000 mg | ORAL_TABLET | Freq: Three times a day (TID) | ORAL | Status: DC | PRN

## 2013-06-14 NOTE — Assessment & Plan Note (Signed)
We've done 3 Supartz injections into each knee, as well as a few steroid injections. Unfortunately he has not had any improvement. He spends all day on his feet at work. Very few mechanical symptoms, I still think he would benefit from surgical intervention, at least arthroscopy. I'm going to give him tramadol for work, and hydrocodone to use at home in the meantime. Referral to Dr. Luiz Blare.

## 2013-06-14 NOTE — Progress Notes (Signed)
  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 Unfortunately, after 3 Supartz injections he has not yet noted any benefit at all. He localizes the pain at the joint lines, no mechanical symptoms with the exception of some slight clicking, no buckling. He spends all day on his feet, and is fairly miserable. Pain is localized, moderate, persistent.  Past medical history, Surgical history, Family history not pertinant except as noted below, Social history, Allergies, and medications have been entered into the medical record, reviewed, and no changes needed.   Review of Systems: No fevers, chills, night sweats, weight loss, chest pain, or shortness of breath.   Objective:    General: Well Developed, well nourished, and in no acute distress.  Neuro: Alert and oriented x3, extra-ocular muscles intact, sensation grossly intact.  HEENT: Normocephalic, atraumatic, pupils equal round reactive to light, neck supple, no masses, no lymphadenopathy, thyroid nonpalpable.  Skin: Warm and dry, no rashes. Cardiac: Regular rate and rhythm, no murmurs rubs or gallops, no lower extremity edema.  Respiratory: Clear to auscultation bilaterally. Not using accessory muscles, speaking in full sentences. Bilateral Knee: Mild visible effusion in the right knee. Tender to palpation at the medial and lateral joint lines. ROM full in flexion and extension and lower leg rotation. Ligaments with solid consistent endpoints including ACL, PCL, LCL, MCL. Negative Mcmurray's, Apley's, and Thessalonian tests. Non painful patellar compression. Patellar glide without crepitus. Patellar and quadriceps tendons unremarkable. Hamstring and quadriceps strength is normal.  Impression and Recommendations:

## 2013-06-29 ENCOUNTER — Ambulatory Visit (INDEPENDENT_AMBULATORY_CARE_PROVIDER_SITE_OTHER): Payer: BC Managed Care – PPO | Admitting: Sports Medicine

## 2013-06-29 ENCOUNTER — Encounter: Payer: Self-pay | Admitting: Sports Medicine

## 2013-06-29 VITALS — BP 117/83 | HR 71 | Wt 288.0 lb

## 2013-06-29 DIAGNOSIS — M1710 Unilateral primary osteoarthritis, unspecified knee: Secondary | ICD-10-CM

## 2013-06-29 DIAGNOSIS — IMO0002 Reserved for concepts with insufficient information to code with codable children: Secondary | ICD-10-CM

## 2013-06-29 DIAGNOSIS — M170 Bilateral primary osteoarthritis of knee: Secondary | ICD-10-CM

## 2013-06-29 MED ORDER — HYDROCODONE-ACETAMINOPHEN 10-325 MG PO TABS: 1.0000 | ORAL_TABLET | Freq: Three times a day (TID) | ORAL | Status: DC | PRN

## 2013-06-29 NOTE — Progress Notes (Signed)
    Patient was fitted for a : standard, cushioned, semi-rigid orthotic. The orthotic was heated and afterward the patient stood on the orthotic blank positioned on the orthotic stand. The patient was positioned in subtalar neutral position and 10 degrees of ankle dorsiflexion in a weight bearing stance. After completion of molding, a stable base was applied to the orthotic blank. The blank was ground to a stable position for weight bearing. Size: 13 Base: Blue EVA Additional Posting and Padding: None The patient ambulated these, and they were very comfortable.  I spent 40 minutes with this patient, greater than 50% was face-to-face time counseling regarding the below diagnosis.   

## 2013-06-29 NOTE — Assessment & Plan Note (Signed)
Custom orthotics as above. Refilling hydrocodone. Continue tramadol as needed.

## 2013-07-09 ENCOUNTER — Telehealth: Payer: Self-pay | Admitting: *Deleted

## 2013-07-09 NOTE — Telephone Encounter (Signed)
Needs to wait until due. He can use tyelnol or NSAID inbetween tramadol if needed so that he is not taking more than 3 in one day.

## 2013-07-09 NOTE — Telephone Encounter (Signed)
Pt calls and states Dr. Karie Schwalbe put him on Tramadol and needs a refill but pharmacy told him it needs MD authorization to fill.  I looked at this and it is 6 days early is why pharmacy said that. Are you ok with filling it or pt needs to wait?

## 2013-07-09 NOTE — Telephone Encounter (Signed)
Pt notified of MD instructions- he agrees but states sometimes when at work on feet all day sometimes he takes 2

## 2013-07-14 DIAGNOSIS — G4733 Obstructive sleep apnea (adult) (pediatric): Secondary | ICD-10-CM | POA: Insufficient documentation

## 2013-07-15 ENCOUNTER — Telehealth: Payer: Self-pay | Admitting: *Deleted

## 2013-07-15 NOTE — Telephone Encounter (Signed)
Pt informed.  Jarielys Girardot, LPN  

## 2013-07-15 NOTE — Telephone Encounter (Signed)
Yes that is fine, have him come in as a nurse visit, and Bjorn Loser can put a knee brace on him.

## 2013-07-15 NOTE — Telephone Encounter (Signed)
Pt has called asking if he can get a knee brace for his left knee like he got for the right one. States he has to have a double knee replacement and wants to wait till October to do it. Please advise if he can come by and get a brace.  Meyer Cory, LPN

## 2013-07-16 ENCOUNTER — Telehealth: Payer: Self-pay | Admitting: *Deleted

## 2013-07-16 NOTE — Telephone Encounter (Signed)
Gave pt the brace for his knee. Also pt states that he can't take the hydrocodone during the day while working so he takes 2 at night. He states he takes the tramadol during the day because he can work while taking but he is taking 2 tabs 3 times a day. Any suggestions or changes to his medication?  Meyer Cory, LPN

## 2013-07-16 NOTE — Telephone Encounter (Signed)
He can use 100 mg of tramadol 3 times a day, this is appropriate and safe. He can call me for refills if needed.

## 2013-07-20 NOTE — Telephone Encounter (Signed)
LMOM with detailed response.  Meyer Cory, LPN

## 2013-08-02 ENCOUNTER — Other Ambulatory Visit: Payer: Self-pay | Admitting: Sports Medicine

## 2013-08-02 ENCOUNTER — Telehealth: Payer: Self-pay

## 2013-08-02 ENCOUNTER — Other Ambulatory Visit: Payer: Self-pay | Admitting: Family Medicine

## 2013-08-02 DIAGNOSIS — M170 Bilateral primary osteoarthritis of knee: Secondary | ICD-10-CM

## 2013-08-02 MED ORDER — HYDROCODONE-ACETAMINOPHEN 10-325 MG PO TABS: 1.0000 | ORAL_TABLET | Freq: Three times a day (TID) | ORAL | Status: DC | PRN

## 2013-08-02 MED ORDER — TRAMADOL HCL 50 MG PO TABS: 50.0000 mg | ORAL_TABLET | Freq: Three times a day (TID) | ORAL | Status: DC | PRN

## 2013-08-02 NOTE — Telephone Encounter (Signed)
Patient request refill for Tramadol and Oxycodone.

## 2013-08-02 NOTE — Telephone Encounter (Signed)
Refilled, prescriptions will be in my box

## 2013-08-05 ENCOUNTER — Other Ambulatory Visit: Payer: Self-pay | Admitting: Orthopedic Surgery

## 2013-08-09 ENCOUNTER — Other Ambulatory Visit: Payer: Self-pay | Admitting: Sports Medicine

## 2013-08-26 ENCOUNTER — Other Ambulatory Visit: Payer: Self-pay

## 2013-08-26 ENCOUNTER — Telehealth: Payer: Self-pay

## 2013-08-26 DIAGNOSIS — M170 Bilateral primary osteoarthritis of knee: Secondary | ICD-10-CM

## 2013-08-26 MED ORDER — HYDROCODONE-ACETAMINOPHEN 10-325 MG PO TABS: 1.0000 | ORAL_TABLET | Freq: Three times a day (TID) | ORAL | Status: DC | PRN

## 2013-08-26 NOTE — Telephone Encounter (Signed)
Refill request for Hyrdocodone

## 2013-08-30 ENCOUNTER — Encounter (HOSPITAL_COMMUNITY): Payer: Self-pay | Admitting: Pharmacy Technician

## 2013-08-31 ENCOUNTER — Encounter (HOSPITAL_COMMUNITY): Payer: Self-pay

## 2013-08-31 ENCOUNTER — Encounter (HOSPITAL_COMMUNITY)
Admission: RE | Admit: 2013-08-31 | Discharge: 2013-08-31 | Disposition: A | Discharge status: Home / Self Care | Payer: BC Managed Care – PPO | Source: Ambulatory Visit | Attending: Orthopedic Surgery | Admitting: Orthopedic Surgery

## 2013-08-31 DIAGNOSIS — Z01812 Encounter for preprocedural laboratory examination: Secondary | ICD-10-CM | POA: Insufficient documentation

## 2013-08-31 HISTORY — DX: Inflammatory liver disease, unspecified: K75.9

## 2013-08-31 HISTORY — DX: Atherosclerotic heart disease of native coronary artery without angina pectoris: I25.10

## 2013-08-31 HISTORY — DX: Unspecified osteoarthritis, unspecified site: M19.90

## 2013-08-31 HISTORY — DX: Sleep apnea, unspecified: G47.30

## 2013-08-31 HISTORY — DX: Gastro-esophageal reflux disease without esophagitis: K21.9

## 2013-08-31 LAB — CBC WITH DIFFERENTIAL/PLATELET
Basophils Relative: 0 % (ref 0–1)
Eosinophils Absolute: 0.2 10*3/uL (ref 0.0–0.7)
HCT: 38.6 % — ABNORMAL LOW (ref 39.0–52.0)
Hemoglobin: 13.3 g/dL (ref 13.0–17.0)
Lymphocytes Relative: 34 % (ref 12–46)
Lymphs Abs: 1.9 10*3/uL (ref 0.7–4.0)
MCH: 28.3 pg (ref 26.0–34.0)
MCHC: 34.5 g/dL (ref 30.0–36.0)
Monocytes Absolute: 0.4 10*3/uL (ref 0.1–1.0)
Monocytes Relative: 7 % (ref 3–12)
Neutro Abs: 3.2 10*3/uL (ref 1.7–7.7)
Neutrophils Relative %: 56 % (ref 43–77)
RBC: 4.7 MIL/uL (ref 4.22–5.81)
RDW: 13 % (ref 11.5–15.5)
WBC: 5.8 10*3/uL (ref 4.0–10.5)

## 2013-08-31 LAB — BASIC METABOLIC PANEL
BUN: 18 mg/dL (ref 6–23)
Calcium: 9.4 mg/dL (ref 8.4–10.5)
Chloride: 103 mEq/L (ref 96–112)
GFR calc non Af Amer: >90 mL/min (ref >90)
Glucose, Bld: 82 mg/dL (ref 70–99)
Potassium: 4.7 mEq/L (ref 3.5–5.1)
Sodium: 142 mEq/L (ref 135–145)

## 2013-08-31 LAB — URINALYSIS, ROUTINE W REFLEX MICROSCOPIC
Glucose, UA: NEGATIVE mg/dL
Ketones, ur: NEGATIVE mg/dL
Leukocytes, UA: NEGATIVE
Nitrite: NEGATIVE
Protein, ur: NEGATIVE mg/dL

## 2013-08-31 LAB — TYPE AND SCREEN: ABO/RH(D): O POS

## 2013-08-31 LAB — ABO/RH: ABO/RH(D): O POS

## 2013-08-31 LAB — PROTIME-INR
INR: 0.93 (ref 0.00–1.49)
Prothrombin Time: 12.3 seconds (ref 11.6–15.2)

## 2013-08-31 NOTE — Pre-Procedure Instructions (Signed)
Bobby White  08/31/2013   Your procedure is scheduled on:  Monday, October 20th   Report to [facility]  2 * 3 at  7:15  AM.  Call this number if you have problems the morning of surgery: [phone]   Remember:   Do not eat food or drink liquids after midnight Sunday.   Take these medicines the morning of surgery with A SIP OF WATER: Amlodipine, Hydrocodone, Metoprolol, Omeprazole, Tramadol, Inhaler   Do not wear jewelry.  Do not wear lotions, powders, or colognes. You may wear deodorant.    Men may shave face and neck.  Do not bring valuables to the hospital.  [facility] is not responsible for any belongings or valuables.               Contacts, dentures or bridgework may not be worn into surgery.  Leave suitcase in the car. After surgery it may be brought to your room.  For patients admitted to the hospital, discharge time is determined by your treatment team.               Name and phone number of your driver: Bobby White  --  SPOUSE   Special Instructions: Shower using CHG 2 nights before surgery and the night before surgery.  If you shower the day of surgery use CHG.  Use special wash - you have one bottle of CHG for all showers.  You should use approximately 1/3 of the bottle for each shower.   Please read over the following fact sheets that you were given: Coughing and Deep Breathing, Blood Transfusion Information, MRSA Information and Surgical Site Infection Prevention

## 2013-08-31 NOTE — Progress Notes (Signed)
Called the [facility] for study results  [phone] [facility]-- Dr. Marcha Solders  [phone]   His heart cath was in 2012, CABG was in 2012--done over at [facility]. Left message for [facility] [phone] for CXR results. Have also checked with Dr. Wadie Lessen office to see what information they have received on this patient.  DA

## 2013-09-01 ENCOUNTER — Encounter (HOSPITAL_COMMUNITY): Payer: Self-pay

## 2013-09-01 NOTE — Progress Notes (Signed)
Anesthesia Chart Review:  Patient is a 52 year old male scheduled for right TKA on 09/06/13 by Dr. Turner Daniels.  History includes former smoker, CAD s/p CABG '12, obesity, HTN, GERD, OSA, varicose veins, arthritis, impaired fasting glucose, hepatitis C. PCP is Dr. Nani Gasser.  Cardiologist is Dr. Marcha Solders with [facility]. Dr. Lorenso Courier last saw patient on 07/20/13 and felt he could proceed with knee surgery without undue cardiac risk.  EKG on 07/20/13 showed SR, old inferior infarct.  His last cardiac cath on was 07/03/11 and showed EF 50%, 100% LAD, 80-90% RCA, 100% PLV, 100% PDA.  Left to right collaterals.  Patient subsequently underwent CABG at [facility].  He had moderate OSA by 06/26/13 sleep study.  His latest CXR from [facility] Kathryne Sharper is still pending.  Preoperative labs noted.  He had been cleared by his cardiologist, so if no acute changes then I would anticpate that he could proceed as planned.  Velna Ochs [facility]/Anesthesiology Phone [phone] 09/01/2013 4:00 PM

## 2013-09-04 NOTE — H&P (Signed)
Bobby White is an 52 y.o. male.   Chief Complaint: Bilateral Knee Pain with Right Worse than Left  HPI: Patient presents with a chief complaint of bilateral knee pain.  Patient has noticed increasing pain over the past 3 months.  He denies any current injury.  Patient is noticed pain numbness weakness and swelling bilaterally.  Patient believes that his symptoms have gradually become worse after years of kneeling on concrete floors.  Patient states his pain is constant and extremely severe.  He describes it as throbbing and aching.  Patient again has noticed swelling and weakness.  This is getting worse.  It does wake him from sleep.  His symptoms are worse with activity and exercise and work and better with ice hydrocodone tramadol and meloxicam.  Patient has received injections in the past including cortisone and Synvisc injections recently.  These injections provided minimal relief.  He denies any prior history of injury.  He denies any fevers chills night sweats or other signs of infection.  He is here today with family.  Past Medical History  Diagnosis Date  . Obesity   . Impaired fasting glucose 2009  . Varicose vein   . Hypertension 1/10  . GERD (gastroesophageal reflux disease)   . Arthritis   . Hepatitis     Hep C --had to get shots  . Sleep apnea   . Coronary artery disease     Past Surgical History  Procedure Laterality Date  . Pins in right hand  1990's    4th and 5th digits-- removed 6 weeks afterwards  . 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 Cardiac catheterization      2012    Family History  Problem Relation Age of Onset  . Diabetes Mother   . Cancer Father     pancreatic   Social History:  reports that he quit smoking about 16 years ago. His smoking use included Cigarettes. He has a 22.5 pack-year smoking history. He does not have any smokeless tobacco history on file. He reports that he drinks alcohol. He reports that he uses illicit drugs  (Marijuana).  Allergies:  Allergies  Allergen Reactions  . Ace Inhibitors     REACTION: cough    No prescriptions prior to admission    No results found for this or any previous visit (from the past 48 hour(s)). No results found.  Review of Systems  Constitutional: Negative.   HENT: Negative.   Eyes: Negative.   Respiratory: Negative.   Cardiovascular: Negative.   Gastrointestinal: Negative.   Genitourinary: Negative.   Musculoskeletal: Positive for joint pain.  Skin: Negative.   Neurological: Negative.   Endo/Heme/Allergies: Negative.   Psychiatric/Behavioral: Negative.   review of systems form filled out by the patient was reviewed and was negative as it relates to the history of present illness except for: High blood pressure, pneumonia left lower lung 2009,  2010 hepatitis, 1974 blood in urine, 1974 sleep apnea.  There were no vitals taken for this visit. Physical Exam  Constitutional: He is oriented to person, place, and time. He appears well-developed and well-nourished.  HENT:  Head: Normocephalic and atraumatic.  Eyes: Pupils are equal, round, and reactive to light.  Neck: Normal range of motion. Neck supple.  Cardiovascular: Intact distal pulses.   Respiratory: Effort normal.  Musculoskeletal: He exhibits tenderness.  Patient has good strength bilaterally in his knees.  He does have reduced range of motion from 5 to 110.  No noticeable instability.  He has diffuse tenderness throughout both knees.  Obvious medial lateral joint line tenderness.  His crepitus with range of motion.  Minimal swelling and no noticeable erythema or warmth.  He has brisk capillary refill and is neurovascularly intact distally.  Neurological: He is alert and oriented to person, place, and time.  Skin: Skin is warm and dry.  Psychiatric: He has a normal mood and affect. His behavior is normal. Judgment and thought content normal.     Assessment/Plan Assess: Bilateral end-stage  arthritis knees with right worse than left symptomatically  Plan: Treatment options are discussed with the patient.  Patient is told that he will need clearance from his cardiologist.  Benefits risks and complications of surgical intervention including a right total knee arthoplasty are discussed with the patient.  As the patient has failed oral medications, and injections, the next step would be to have a right knee replacement.  Patient is made aware the potential complications and he wishes to proceed with scheduling.  Patient is to discuss clearance with Agustin Cree.  Patient is to call with any issues.  Lonia Roane R 09/04/2013, 8:38 PM

## 2013-09-05 MED ORDER — CEFAZOLIN SODIUM 10 G IJ SOLR
3.0000 g | INTRAMUSCULAR | Status: AC
  Administered 2013-09-06: 3 g via INTRAVENOUS
  Filled 2013-09-05: qty 3000

## 2013-09-06 ENCOUNTER — Inpatient Hospital Stay (HOSPITAL_COMMUNITY): Payer: BC Managed Care – PPO

## 2013-09-06 ENCOUNTER — Encounter (HOSPITAL_COMMUNITY): Payer: BC Managed Care – PPO | Admitting: Vascular Surgery

## 2013-09-06 ENCOUNTER — Inpatient Hospital Stay (HOSPITAL_COMMUNITY): Payer: BC Managed Care – PPO | Admitting: Anesthesiology

## 2013-09-06 ENCOUNTER — Inpatient Hospital Stay (HOSPITAL_COMMUNITY)
Admission: RE | Admit: 2013-09-06 | Discharge: 2013-09-10 | DRG: 470 | Disposition: A | Discharge status: Skilled Nursing Facility | Payer: BC Managed Care – PPO | Source: Ambulatory Visit | Attending: Orthopedic Surgery | Admitting: Orthopedic Surgery

## 2013-09-06 ENCOUNTER — Encounter (HOSPITAL_COMMUNITY)
Admission: RE | Disposition: A | Discharge status: Skilled Nursing Facility | Payer: Self-pay | Source: Ambulatory Visit | Attending: Orthopedic Surgery

## 2013-09-06 ENCOUNTER — Encounter (HOSPITAL_COMMUNITY): Payer: Self-pay | Admitting: Anesthesiology

## 2013-09-06 DIAGNOSIS — M1711 Unilateral primary osteoarthritis, right knee: Secondary | ICD-10-CM | POA: Diagnosis present

## 2013-09-06 DIAGNOSIS — Z79899 Other long term (current) drug therapy: Secondary | ICD-10-CM

## 2013-09-06 DIAGNOSIS — Z6841 Body Mass Index (BMI) 40.0 and over, adult: Secondary | ICD-10-CM

## 2013-09-06 DIAGNOSIS — Z951 Presence of aortocoronary bypass graft: Secondary | ICD-10-CM

## 2013-09-06 DIAGNOSIS — G4730 Sleep apnea, unspecified: Secondary | ICD-10-CM | POA: Diagnosis present

## 2013-09-06 DIAGNOSIS — F1210 Cannabis abuse, uncomplicated: Secondary | ICD-10-CM | POA: Diagnosis present

## 2013-09-06 DIAGNOSIS — I739 Peripheral vascular disease, unspecified: Secondary | ICD-10-CM | POA: Diagnosis present

## 2013-09-06 DIAGNOSIS — B1920 Unspecified viral hepatitis C without hepatic coma: Secondary | ICD-10-CM | POA: Diagnosis present

## 2013-09-06 DIAGNOSIS — Z833 Family history of diabetes mellitus: Secondary | ICD-10-CM

## 2013-09-06 DIAGNOSIS — Z87891 Personal history of nicotine dependence: Secondary | ICD-10-CM

## 2013-09-06 DIAGNOSIS — Z23 Encounter for immunization: Secondary | ICD-10-CM

## 2013-09-06 DIAGNOSIS — M1710 Unilateral primary osteoarthritis, unspecified knee: Principal | ICD-10-CM | POA: Diagnosis present

## 2013-09-06 DIAGNOSIS — I10 Essential (primary) hypertension: Secondary | ICD-10-CM | POA: Diagnosis present

## 2013-09-06 DIAGNOSIS — K219 Gastro-esophageal reflux disease without esophagitis: Secondary | ICD-10-CM | POA: Diagnosis present

## 2013-09-06 DIAGNOSIS — Z7982 Long term (current) use of aspirin: Secondary | ICD-10-CM

## 2013-09-06 DIAGNOSIS — I2510 Atherosclerotic heart disease of native coronary artery without angina pectoris: Secondary | ICD-10-CM | POA: Diagnosis present

## 2013-09-06 HISTORY — PX: TOTAL KNEE ARTHROPLASTY: SHX125

## 2013-09-06 LAB — GRAM STAIN

## 2013-09-06 SURGERY — ARTHROPLASTY, KNEE, TOTAL
Anesthesia: General | Site: Knee | Laterality: Right | Wound class: Clean

## 2013-09-06 MED ORDER — MENTHOL 3 MG MT LOZG: 1.0000 | LOZENGE | OROMUCOSAL | Status: DC | PRN

## 2013-09-06 MED ORDER — OXYCODONE HCL 5 MG PO TABS
5.0000 mg | ORAL_TABLET | Freq: Once | ORAL | Status: DC | PRN
  Administered 2013-09-06: 5 mg via ORAL

## 2013-09-06 MED ORDER — HYDROMORPHONE HCL PF 1 MG/ML IJ SOLN
INTRAMUSCULAR | Status: AC
  Filled 2013-09-06: qty 1

## 2013-09-06 MED ORDER — METHOCARBAMOL 500 MG PO TABS
500.0000 mg | ORAL_TABLET | Freq: Four times a day (QID) | ORAL | Status: DC | PRN
  Administered 2013-09-06 – 2013-09-10 (×13): 500 mg via ORAL
  Filled 2013-09-06 (×14): qty 1

## 2013-09-06 MED ORDER — OXYCODONE HCL 5 MG/5ML PO SOLN: 5.0000 mg | Freq: Once | ORAL | Status: DC | PRN

## 2013-09-06 MED ORDER — ONDANSETRON HCL 4 MG/2ML IJ SOLN: 4.0000 mg | Freq: Four times a day (QID) | INTRAMUSCULAR | Status: DC | PRN

## 2013-09-06 MED ORDER — ASPIRIN EC 325 MG PO TBEC
325.0000 mg | DELAYED_RELEASE_TABLET | Freq: Every day | ORAL | Status: DC
  Administered 2013-09-07 – 2013-09-10 (×4): 325 mg via ORAL
  Filled 2013-09-06 (×5): qty 1

## 2013-09-06 MED ORDER — TOPIRAMATE 25 MG PO TABS
50.0000 mg | ORAL_TABLET | Freq: Every day | ORAL | Status: DC | PRN
  Filled 2013-09-06: qty 2

## 2013-09-06 MED ORDER — CHLORHEXIDINE GLUCONATE 4 % EX LIQD: 60.0000 mL | Freq: Once | CUTANEOUS | Status: DC

## 2013-09-06 MED ORDER — METOPROLOL SUCCINATE ER 25 MG PO TB24
25.0000 mg | ORAL_TABLET | Freq: Every day | ORAL | Status: DC
  Administered 2013-09-07 – 2013-09-10 (×4): 25 mg via ORAL
  Filled 2013-09-06 (×4): qty 1

## 2013-09-06 MED ORDER — NEOSTIGMINE METHYLSULFATE 1 MG/ML IJ SOLN
INTRAMUSCULAR | Status: DC | PRN
  Administered 2013-09-06: 4 mg via INTRAVENOUS

## 2013-09-06 MED ORDER — ALUM & MAG HYDROXIDE-SIMETH 200-200-20 MG/5ML PO SUSP
30.0000 mL | ORAL | Status: DC | PRN
  Administered 2013-09-09: 30 mL via ORAL
  Filled 2013-09-06: qty 30

## 2013-09-06 MED ORDER — DIPHENHYDRAMINE HCL 12.5 MG/5ML PO ELIX: 12.5000 mg | ORAL_SOLUTION | ORAL | Status: DC | PRN

## 2013-09-06 MED ORDER — CEFUROXIME SODIUM 1.5 G IJ SOLR
INTRAMUSCULAR | Status: AC
  Filled 2013-09-06: qty 1.5

## 2013-09-06 MED ORDER — GLYCOPYRROLATE 0.2 MG/ML IJ SOLN
INTRAMUSCULAR | Status: DC | PRN
  Administered 2013-09-06: .7 mg via INTRAVENOUS

## 2013-09-06 MED ORDER — ROCURONIUM BROMIDE 100 MG/10ML IV SOLN
INTRAVENOUS | Status: DC | PRN
  Administered 2013-09-06: 50 mg via INTRAVENOUS

## 2013-09-06 MED ORDER — PROPOFOL 10 MG/ML IV BOLUS
INTRAVENOUS | Status: DC | PRN
  Administered 2013-09-06: 200 mg via INTRAVENOUS

## 2013-09-06 MED ORDER — PANTOPRAZOLE SODIUM 40 MG PO TBEC
40.0000 mg | DELAYED_RELEASE_TABLET | Freq: Every day | ORAL | Status: DC
  Administered 2013-09-07 – 2013-09-10 (×4): 40 mg via ORAL
  Filled 2013-09-06 (×4): qty 1

## 2013-09-06 MED ORDER — DOCUSATE SODIUM 100 MG PO CAPS
100.0000 mg | ORAL_CAPSULE | Freq: Two times a day (BID) | ORAL | Status: DC
  Administered 2013-09-06 – 2013-09-10 (×8): 100 mg via ORAL
  Filled 2013-09-06 (×10): qty 1

## 2013-09-06 MED ORDER — BUPIVACAINE LIPOSOME 1.3 % IJ SUSP
20.0000 mL | Freq: Once | INTRAMUSCULAR | Status: DC
  Filled 2013-09-06: qty 20

## 2013-09-06 MED ORDER — MAGNESIUM CITRATE PO SOLN
1.0000 | Freq: Once | ORAL | Status: AC | PRN
  Filled 2013-09-06: qty 296

## 2013-09-06 MED ORDER — FENTANYL CITRATE 0.05 MG/ML IJ SOLN
INTRAMUSCULAR | Status: AC
  Administered 2013-09-06: 100 ug via INTRAVENOUS
  Filled 2013-09-06: qty 2

## 2013-09-06 MED ORDER — ONDANSETRON HCL 4 MG/2ML IJ SOLN
INTRAMUSCULAR | Status: DC | PRN
  Administered 2013-09-06: 4 mg via INTRAMUSCULAR

## 2013-09-06 MED ORDER — ACETAMINOPHEN 650 MG RE SUPP: 650.0000 mg | Freq: Four times a day (QID) | RECTAL | Status: DC | PRN

## 2013-09-06 MED ORDER — LACTATED RINGERS IV SOLN
INTRAVENOUS | Status: DC
  Administered 2013-09-06: 50 mL/h via INTRAVENOUS

## 2013-09-06 MED ORDER — HYDROMORPHONE HCL PF 1 MG/ML IJ SOLN
1.0000 mg | INTRAMUSCULAR | Status: DC | PRN
  Administered 2013-09-06 – 2013-09-07 (×4): 1 mg via INTRAVENOUS
  Filled 2013-09-06 (×3): qty 1

## 2013-09-06 MED ORDER — SENNOSIDES-DOCUSATE SODIUM 8.6-50 MG PO TABS
1.0000 | ORAL_TABLET | Freq: Every evening | ORAL | Status: DC | PRN
  Administered 2013-09-08: 1 via ORAL
  Filled 2013-09-06: qty 1

## 2013-09-06 MED ORDER — ONDANSETRON HCL 4 MG PO TABS
4.0000 mg | ORAL_TABLET | Freq: Four times a day (QID) | ORAL | Status: DC | PRN
  Administered 2013-09-08 – 2013-09-10 (×3): 4 mg via ORAL
  Filled 2013-09-06 (×3): qty 1

## 2013-09-06 MED ORDER — HYDROMORPHONE HCL PF 1 MG/ML IJ SOLN
INTRAMUSCULAR | Status: AC
  Filled 2013-09-06: qty 2

## 2013-09-06 MED ORDER — SODIUM CHLORIDE 0.9 % IJ SOLN
INTRAMUSCULAR | Status: DC | PRN
  Administered 2013-09-06: 10:00:00

## 2013-09-06 MED ORDER — ACETAMINOPHEN 325 MG PO TABS: 650.0000 mg | ORAL_TABLET | Freq: Four times a day (QID) | ORAL | Status: DC | PRN

## 2013-09-06 MED ORDER — LIDOCAINE HCL (CARDIAC) 20 MG/ML IV SOLN
INTRAVENOUS | Status: DC | PRN
  Administered 2013-09-06: 80 mg via INTRAVENOUS

## 2013-09-06 MED ORDER — LACTATED RINGERS IV SOLN
INTRAVENOUS | Status: DC | PRN
  Administered 2013-09-06 (×2): via INTRAVENOUS

## 2013-09-06 MED ORDER — SODIUM CHLORIDE 0.9 % IR SOLN
Status: DC | PRN
  Administered 2013-09-06: 3000 mL

## 2013-09-06 MED ORDER — ALBUTEROL SULFATE HFA 108 (90 BASE) MCG/ACT IN AERS
2.0000 | INHALATION_SPRAY | Freq: Four times a day (QID) | RESPIRATORY_TRACT | Status: DC | PRN
  Filled 2013-09-06: qty 6.7

## 2013-09-06 MED ORDER — KCL IN DEXTROSE-NACL 20-5-0.45 MEQ/L-%-% IV SOLN
INTRAVENOUS | Status: DC
  Administered 2013-09-06 – 2013-09-07 (×2): via INTRAVENOUS
  Filled 2013-09-06 (×16): qty 1000

## 2013-09-06 MED ORDER — FENTANYL CITRATE 0.05 MG/ML IJ SOLN
50.0000 ug | INTRAMUSCULAR | Status: DC | PRN
  Administered 2013-09-06: 100 ug via INTRAVENOUS

## 2013-09-06 MED ORDER — METOCLOPRAMIDE HCL 5 MG PO TABS
5.0000 mg | ORAL_TABLET | Freq: Three times a day (TID) | ORAL | Status: DC | PRN
  Filled 2013-09-06: qty 2

## 2013-09-06 MED ORDER — AMLODIPINE BESYLATE 5 MG PO TABS
5.0000 mg | ORAL_TABLET | Freq: Every day | ORAL | Status: DC
  Administered 2013-09-07 – 2013-09-10 (×4): 5 mg via ORAL
  Filled 2013-09-06 (×4): qty 1

## 2013-09-06 MED ORDER — PROMETHAZINE HCL 25 MG/ML IJ SOLN: 6.2500 mg | INTRAMUSCULAR | Status: DC | PRN

## 2013-09-06 MED ORDER — NITROGLYCERIN 0.4 MG SL SUBL: 0.4000 mg | SUBLINGUAL_TABLET | SUBLINGUAL | Status: DC | PRN

## 2013-09-06 MED ORDER — METOCLOPRAMIDE HCL 5 MG/ML IJ SOLN: 5.0000 mg | Freq: Three times a day (TID) | INTRAMUSCULAR | Status: DC | PRN

## 2013-09-06 MED ORDER — OXYCODONE HCL 5 MG PO TABS
5.0000 mg | ORAL_TABLET | ORAL | Status: DC | PRN
  Administered 2013-09-06 – 2013-09-07 (×3): 10 mg via ORAL
  Filled 2013-09-06 (×3): qty 2

## 2013-09-06 MED ORDER — BISACODYL 5 MG PO TBEC
5.0000 mg | DELAYED_RELEASE_TABLET | Freq: Every day | ORAL | Status: DC | PRN
  Administered 2013-09-07 – 2013-09-08 (×2): 5 mg via ORAL
  Filled 2013-09-06 (×2): qty 1

## 2013-09-06 MED ORDER — METHOCARBAMOL 500 MG PO TABS
ORAL_TABLET | ORAL | Status: AC
  Filled 2013-09-06: qty 1

## 2013-09-06 MED ORDER — HYDROMORPHONE HCL PF 1 MG/ML IJ SOLN
0.2500 mg | INTRAMUSCULAR | Status: DC | PRN
  Administered 2013-09-06 (×4): 0.5 mg via INTRAVENOUS

## 2013-09-06 MED ORDER — PHENOL 1.4 % MT LIQD: 1.0000 | OROMUCOSAL | Status: DC | PRN

## 2013-09-06 MED ORDER — OXYCODONE HCL 5 MG PO TABS
ORAL_TABLET | ORAL | Status: AC
  Filled 2013-09-06: qty 1

## 2013-09-06 MED ORDER — CEFUROXIME SODIUM 1.5 G IJ SOLR
INTRAMUSCULAR | Status: DC | PRN
  Administered 2013-09-06: 1.5 g

## 2013-09-06 MED ORDER — FENTANYL CITRATE 0.05 MG/ML IJ SOLN
INTRAMUSCULAR | Status: DC | PRN
  Administered 2013-09-06 (×2): 100 ug via INTRAVENOUS
  Administered 2013-09-06: 50 ug via INTRAVENOUS

## 2013-09-06 MED ORDER — INFLUENZA VAC SPLIT QUAD 0.5 ML IM SUSP
0.5000 mL | INTRAMUSCULAR | Status: AC
  Administered 2013-09-07: 0.5 mL via INTRAMUSCULAR
  Filled 2013-09-06: qty 0.5

## 2013-09-06 MED ORDER — DEXTROSE 5 % IV SOLN
500.0000 mg | Freq: Four times a day (QID) | INTRAVENOUS | Status: DC | PRN
  Filled 2013-09-06: qty 5

## 2013-09-06 MED ORDER — DEXTROSE-NACL 5-0.45 % IV SOLN: INTRAVENOUS | Status: DC

## 2013-09-06 MED ORDER — MIDAZOLAM HCL 2 MG/2ML IJ SOLN
INTRAMUSCULAR | Status: AC
  Administered 2013-09-06: 2 mg via INTRAVENOUS
  Filled 2013-09-06: qty 2

## 2013-09-06 MED ORDER — MIDAZOLAM HCL 2 MG/2ML IJ SOLN
1.0000 mg | INTRAMUSCULAR | Status: DC | PRN
Start: 2013-09-06 — End: 2013-09-06
  Administered 2013-09-06: 2 mg via INTRAVENOUS

## 2013-09-06 SURGICAL SUPPLY — 67 items
BANDAGE ESMARK 6X9 LF (GAUZE/BANDAGES/DRESSINGS) ×1 IMPLANT
BLADE SAG 18X100X1.27 (BLADE) ×4 IMPLANT
BLADE SAW SGTL 13X75X1.27 (BLADE) IMPLANT
BLADE SURG ROTATE 9660 (MISCELLANEOUS) ×2 IMPLANT
BNDG ELASTIC 6X10 VLCR STRL LF (GAUZE/BANDAGES/DRESSINGS) ×2 IMPLANT
BNDG ESMARK 6X9 LF (GAUZE/BANDAGES/DRESSINGS) ×2
BOWL SMART MIX CTS (DISPOSABLE) ×2 IMPLANT
CAPT RP KNEE ×2 IMPLANT
CEMENT HV SMART SET (Cement) ×4 IMPLANT
CLOTH BEACON ORANGE TIMEOUT ST (SAFETY) ×2 IMPLANT
CONT SPEC STER OR (MISCELLANEOUS) ×2 IMPLANT
COVER SURGICAL LIGHT HANDLE (MISCELLANEOUS) ×4 IMPLANT
CUFF TOURNIQUET SINGLE 34IN LL (TOURNIQUET CUFF) IMPLANT
CUFF TOURNIQUET SINGLE 44IN (TOURNIQUET CUFF) IMPLANT
DRAPE EXTREMITY T 121X128X90 (DRAPE) ×2 IMPLANT
DRAPE INCISE IOBAN 66X45 STRL (DRAPES) ×2 IMPLANT
DRAPE U-SHAPE 47X51 STRL (DRAPES) ×2 IMPLANT
DRSG PAD ABDOMINAL 8X10 ST (GAUZE/BANDAGES/DRESSINGS) ×2 IMPLANT
DURAPREP 26ML APPLICATOR (WOUND CARE) ×2 IMPLANT
ELECT REM PT RETURN 9FT ADLT (ELECTROSURGICAL) ×2
ELECTRODE REM PT RTRN 9FT ADLT (ELECTROSURGICAL) ×1 IMPLANT
EVACUATOR 1/8 PVC DRAIN (DRAIN) ×4 IMPLANT
FACESHIELD OPICON LG (MASK) ×2 IMPLANT
GAUZE XEROFORM 1X8 LF (GAUZE/BANDAGES/DRESSINGS) ×2 IMPLANT
GLOVE BIO SURGEON STRL SZ7 (GLOVE) ×2 IMPLANT
GLOVE BIO SURGEON STRL SZ7.5 (GLOVE) ×4 IMPLANT
GLOVE BIO SURGEON STRL SZ8.5 (GLOVE) ×4 IMPLANT
GLOVE BIOGEL PI IND STRL 6.5 (GLOVE) ×1 IMPLANT
GLOVE BIOGEL PI IND STRL 7.0 (GLOVE) ×1 IMPLANT
GLOVE BIOGEL PI IND STRL 8 (GLOVE) ×3 IMPLANT
GLOVE BIOGEL PI IND STRL 9 (GLOVE) ×1 IMPLANT
GLOVE BIOGEL PI INDICATOR 6.5 (GLOVE) ×1
GLOVE BIOGEL PI INDICATOR 7.0 (GLOVE) ×1
GLOVE BIOGEL PI INDICATOR 8 (GLOVE) ×3
GLOVE BIOGEL PI INDICATOR 9 (GLOVE) ×1
GLOVE SURG SS PI 8.0 STRL IVOR (GLOVE) ×2 IMPLANT
GOWN PREVENTION PLUS XLARGE (GOWN DISPOSABLE) ×2 IMPLANT
GOWN PREVENTION PLUS XXLARGE (GOWN DISPOSABLE) ×2 IMPLANT
GOWN STRL NON-REIN LRG LVL3 (GOWN DISPOSABLE) IMPLANT
GOWN STRL REIN XL XLG (GOWN DISPOSABLE) ×2 IMPLANT
HANDPIECE INTERPULSE COAX TIP (DISPOSABLE) ×1
HOOD PEEL AWAY FACE SHEILD DIS (HOOD) ×4 IMPLANT
KIT BASIN OR (CUSTOM PROCEDURE TRAY) ×2 IMPLANT
KIT ROOM TURNOVER OR (KITS) ×2 IMPLANT
MANIFOLD NEPTUNE II (INSTRUMENTS) ×2 IMPLANT
NDL SAFETY ECLIPSE 18X1.5 (NEEDLE) IMPLANT
NEEDLE HYPO 18GX1.5 SHARP (NEEDLE)
NEEDLE SPNL 18GX3.5 QUINCKE PK (NEEDLE) ×2 IMPLANT
NS IRRIG 1000ML POUR BTL (IV SOLUTION) IMPLANT
PACK TOTAL JOINT (CUSTOM PROCEDURE TRAY) ×2 IMPLANT
PAD ARMBOARD 7.5X6 YLW CONV (MISCELLANEOUS) ×2 IMPLANT
PADDING CAST COTTON 6X4 STRL (CAST SUPPLIES) ×2 IMPLANT
SET HNDPC FAN SPRY TIP SCT (DISPOSABLE) ×1 IMPLANT
SPONGE GAUZE 4X4 12PLY (GAUZE/BANDAGES/DRESSINGS) ×2 IMPLANT
STAPLER VISISTAT 35W (STAPLE) ×2 IMPLANT
SUCTION FRAZIER TIP 10 FR DISP (SUCTIONS) IMPLANT
SUT VIC AB 0 CTX 36 (SUTURE) ×1
SUT VIC AB 0 CTX36XBRD ANTBCTR (SUTURE) ×1 IMPLANT
SUT VIC AB 1 CTX 36 (SUTURE) ×1
SUT VIC AB 1 CTX36XBRD ANBCTR (SUTURE) ×1 IMPLANT
SUT VIC AB 2-0 CT1 27 (SUTURE) ×1
SUT VIC AB 2-0 CT1 TAPERPNT 27 (SUTURE) ×1 IMPLANT
SYR 50ML LL SCALE MARK (SYRINGE) ×2 IMPLANT
TOWEL OR 17X24 6PK STRL BLUE (TOWEL DISPOSABLE) ×2 IMPLANT
TOWEL OR 17X26 10 PK STRL BLUE (TOWEL DISPOSABLE) ×2 IMPLANT
TRAY FOLEY CATH 14FR (SET/KITS/TRAYS/PACK) ×2 IMPLANT
WATER STERILE IRR 1000ML POUR (IV SOLUTION) ×2 IMPLANT

## 2013-09-06 NOTE — Interval H&P Note (Signed)
History and Physical Interval Note:  09/06/2013 9:03 AM  Bobby White  has presented today for surgery, with the diagnosis of RIGHT KNEE OSTEOARTHRITIS  The various methods of treatment have been discussed with the patient and family. After consideration of risks, benefits and other options for treatment, the patient has consented to  Procedure(s): TOTAL KNEE ARTHROPLASTY (Right) as a surgical intervention .  The patient's history has been reviewed, patient examined, no change in status, stable for surgery.  I have reviewed the patient's chart and labs.  Questions were answered to the patient's satisfaction.     Nestor Lewandowsky

## 2013-09-06 NOTE — Anesthesia Preprocedure Evaluation (Addendum)
Anesthesia Evaluation  Patient identified by MRN, date of birth, ID band Patient awake    Reviewed: Allergy & Precautions, H&P , NPO status , Patient's Chart, lab work & pertinent test results  History of Anesthesia Complications Negative for: history of anesthetic complications  Airway Mallampati: II TM Distance: >3 FB Neck ROM: Full    Dental  (+) Teeth Intact and Dental Advisory Given   Pulmonary sleep apnea ,    Pulmonary exam normal       Cardiovascular hypertension, Pt. on medications and Pt. on home beta blockers + CAD and + Peripheral Vascular Disease  His last cardiac cath on was 07/03/11 and showed EF 50%, 100% LAD, 80-90% RCA, 100% PLV, 100% PDA.  Left to right collaterals.  Patient subsequently underwent CABG at [facility].   Neuro/Psych    GI/Hepatic GERD-  Medicated,(+) Hepatitis -, C  Endo/Other  Morbid obesity  Renal/GU      Musculoskeletal   Abdominal   Peds  Hematology   Anesthesia Other Findings   Reproductive/Obstetrics                         Anesthesia Physical Anesthesia Plan  ASA: III  Anesthesia Plan: General   Post-op Pain Management:    Induction: Intravenous  Airway Management Planned: Oral ETT  Additional Equipment:   Intra-op Plan:   Post-operative Plan: Extubation in OR  Informed Consent: I have reviewed the patients History and Physical, chart, labs and discussed the procedure including the risks, benefits and alternatives for the proposed anesthesia with the patient or authorized representative who has indicated his/her understanding and acceptance.   Dental advisory given  Plan Discussed with: CRNA, Anesthesiologist and Surgeon  Anesthesia Plan Comments:        Anesthesia Quick Evaluation

## 2013-09-06 NOTE — Op Note (Signed)
PATIENT ID:      DAMARCUS REGGIO  MRN:     [id number] DOB/AGE:    1961/08/22 / 52 y.o.       OPERATIVE REPORT    DATE OF PROCEDURE:  09/06/2013       PREOPERATIVE DIAGNOSIS:   RIGHT KNEE OSTEOARTHRITIS      Estimated body mass index is 40.19 kg/(m^2) as calculated from the following:   Height as of 08/31/13: 5\' 11"  (1.803 m).   Weight as of 06/29/13: 130.636 kg (288 lb).                                                        POSTOPERATIVE DIAGNOSIS:   RIGHT KNEE OSTEOARTHRITIS                                                                      PROCEDURE:  Procedure(s): TOTAL KNEE ARTHROPLASTY Using Depuy Sigma RP implants #5R Femur, #5Tibia, 10mm sigma RP bearing, 41 Patella     SURGEON: [employer] Jordahl J    ASSISTANT:   Eric K. [employer]   (Present and scrubbed throughout the case, critical for assistance with exposure, retraction, instrumentation, and closure.)         ANESTHESIA: GET Eparel  DRAINS: foley, 2 medium hemovac in knee   TOURNIQUET TIME:   COMPLICATIONS:  None     SPECIMENS: None   INDICATIONS FOR PROCEDURE: The patient has  RIGHT KNEE OSTEOARTHRITIS, varus deformities, XR shows bone on bone arthritis. Patient has failed all conservative measures including anti-inflammatory medicines, narcotics, attempts at  exercise and weight loss, cortisone injections and viscosupplementation.  Risks and benefits of surgery have been discussed, questions answered.   DESCRIPTION OF PROCEDURE: The patient identified by armband, received  IV antibiotics, in the holding area at [facility]. Patient taken to the operating room, appropriate anesthetic  monitors were attached, and general endotracheal anesthesia induced with  the patient in supine position, Foley catheter was inserted. Tourniquet  applied high to the operative thigh. Lateral post and foot positioner  applied to the table, the lower extremity was then prepped and draped  in usual sterile fashion from the  ankle to the tourniquet. Time-out procedure was performed. The limb was wrapped with an Esmarch bandage and the tourniquet inflated to 350 mmHg. We began the operation by making the anterior midline incision starting at handbreadth above the patella going over the patella 1 cm medial to and  4 cm distal to the tibial tubercle. Small bleeders in the skin and the  subcutaneous tissue identified and cauterized. Transverse retinaculum was incised and reflected medially and a medial parapatellar arthrotomy was accomplished. the patella was everted and theprepatellar fat pad resected. The superficial medial collateral  ligament was then elevated from anterior to posterior along the proximal  flare of the tibia and anterior half of the menisci resected. The knee was hyperflexed exposing bone on bone arthritis. Peripheral and notch osteophytes as well as the cruciate ligaments were then resected. We continued to  work our way around posteriorly along the  proximal tibia, and externally  rotated the tibia subluxing it out from underneath the femur. A McHale  retractor was placed through the notch and a lateral Hohmann retractor  placed, and we then drilled through the proximal tibia in line with the  axis of the tibia followed by an intramedullary guide rod and 2-degree  posterior slope cutting guide. The tibial cutting guide was pinned into place  allowing resection of 3 mm of bone medially and about 9 mm of bone  laterally because of her varus deformity. Satisfied with the tibial resection, we then  entered the distal femur 2 mm anterior to the PCL origin with the  intramedullary guide rod and applied the distal femoral cutting guide  set at 11mm, with 5 degrees of valgus. This was pinned along the  epicondylar axis. At this point, the distal femoral cut was accomplished without difficulty. We then sized for a #5R femoral component and pinned the guide in 3 degrees of external rotation.The chamfer cutting  guide was pinned into place. The anterior, posterior, and chamfer cuts were accomplished without difficulty followed by  the Sigma RP box cutting guide and the box cut. We also removed posterior osteophytes from the posterior femoral condyles. At this  time, the knee was brought into full extension. We checked our  extension and flexion gaps and found them symmetric at 10mm.  The patella thickness measured at 26 mm. We set the cutting guide at 16 and removed the posterior 10 mm  of the patella, sized for a 41 button and drilled the lollipop. The knee  was then once again hyperflexed exposing the proximal tibia. We sized for a #5 tibial base plate, applied the smokestack and the conical reamer followed by the the Delta fin keel punch. We then hammered into place the Sigma RP trial femoral component, inserted a 10-mm trial bearing, trial patellar button, and took the knee through range of motion from 0-130 degrees. No thumb pressure was required for patellar  tracking. At this point, all trial components were removed, a double batch of DePuy HV cement with 1500 mg of Zinacef was mixed and applied to all bony metallic mating surfaces except for the posterior condyles of the femur itself. In order, we  hammered into place the tibial tray and removed excess cement, the femoral component and removed excess cement, a 10-mm Sigma RP bearing  was inserted, and the knee brought to full extension with compression.  The patellar button was clamped into place, and excess cement  removed. While the cement cured the wound was irrigated out with normal saline solution pulse lavage, and medium Hemovac drains were placed from an anterolateral  approach. Ligament stability and patellar tracking were checked and found to be excellent. The parapatellar arthrotomy was closed with  running #1 Vicryl suture. The subcutaneous tissue with 0 and 2-0 undyed  Vicryl suture, and the skin with skin staples. A dressing of Xeroform,   4 x 4, dressing sponges, Webril, and Ace wrap applied. The patient  awakened, extubated, and taken to recovery room without difficulty.   Greco Gastelum J 09/06/2013, 10:59 AM

## 2013-09-06 NOTE — Interval H&P Note (Signed)
History and Physical Interval Note:  09/06/2013 9:04 AM  Bobby White  has presented today for surgery, with the diagnosis of RIGHT KNEE OSTEOARTHRITIS  The various methods of treatment have been discussed with the patient and family. After consideration of risks, benefits and other options for treatment, the patient has consented to  Procedure(s): TOTAL KNEE ARTHROPLASTY (Right) as a surgical intervention .  The patient's history has been reviewed, patient examined, no change in status, stable for surgery.  I have reviewed the patient's chart and labs.  Questions were answered to the patient's satisfaction.     Nestor Lewandowsky

## 2013-09-06 NOTE — Progress Notes (Signed)
Pt arrived to pacu with 400cc bloody dng in hemovac and when emptied, quickly drained another 400cc. Dr.Rowan notified by OR nurse Gerlene Fee RN who was ordered to change hemovac and clamp for 1 hr. Will cont to monitor.

## 2013-09-06 NOTE — Anesthesia Procedure Notes (Addendum)
Anesthesia Regional Block:  Femoral nerve block  Pre-Anesthetic Checklist: ,, timeout performed, Correct Patient, Correct Site, Correct Laterality, Correct Procedure, Correct Position, site marked, Risks and benefits discussed,  Surgical consent,  Pre-op evaluation,  At surgeon's request and post-op pain management  Laterality: Right  Prep: chloraprep       Needles:  Injection technique: Single-shot  Needle Type: Echogenic Stimulator Needle     Needle Length: 5cm 5 cm Needle Gauge: 22 and 22 G    Additional Needles:  Procedures: ultrasound guided (picture in chart) and nerve stimulator Femoral nerve block  Nerve Stimulator or Paresthesia:  Response: quadraceps contraction, 0.45 mA,   Additional Responses:   Narrative:  Start time: 09/06/2013 8:42 AM End time: 09/06/2013 8:51 AM Injection made incrementally with aspirations every 5 mL.  Performed by: Personally  Anesthesiologist: Halford Decamp, MD  Additional Notes: Functioning IV was confirmed and monitors were applied.  A 50mm 22ga Arrow echogenic stimulator needle was used. Sterile prep and drape,hand hygiene and sterile gloves were used. Ultrasound guidance: relevant anatomy identified, needle position confirmed, local anesthetic spread visualized around nerve(s)., vascular puncture avoided.  Image printed for medical record. Negative aspiration and negative test dose prior to incremental administration of local anesthetic. The patient tolerated the procedure well.    Femoral nerve block Procedure Name: Intubation Date/Time: 09/06/2013 9:44 AM Performed by: Gwenyth Allegra Pre-anesthesia Checklist: Emergency Drugs available, Patient identified, Timeout performed, Suction available and Patient being monitored Patient Re-evaluated:Patient Re-evaluated prior to inductionOxygen Delivery Method: Circle system utilized Preoxygenation: Pre-oxygenation with 100% oxygen Intubation Type: IV induction Ventilation: Mask  ventilation without difficulty and Oral airway inserted - appropriate to patient size Laryngoscope Size: Mac and 4 Grade View: Grade I Tube type: Oral Tube size: 8.0 mm Number of attempts: 1 Airway Equipment and Method: Stylet Dental Injury: Teeth and Oropharynx as per pre-operative assessment

## 2013-09-06 NOTE — Preoperative (Signed)
Beta Blockers   Reason not to administer Beta Blockers:Not Applicable 

## 2013-09-06 NOTE — Progress Notes (Signed)
Utilization Review Completed.Zakayla Martinec T10/20/2014  

## 2013-09-06 NOTE — Anesthesia Postprocedure Evaluation (Signed)
Anesthesia Post Note  Patient: Bobby White  Procedure(s) Performed: Procedure(s) (LRB): TOTAL KNEE ARTHROPLASTY (Right)  Anesthesia type: general  Patient location: PACU  Post pain: Pain level controlled  Post assessment: Patient's Cardiovascular Status Stable  Last Vitals:  Filed Vitals:   09/06/13 1252  BP: 142/78  Pulse: 70  Temp:   Resp: 15    Post vital signs: Reviewed and stable  Level of consciousness: sedated  Complications: No apparent anesthesia complications

## 2013-09-06 NOTE — Transfer of Care (Signed)
Immediate Anesthesia Transfer of Care Note  Patient: Bobby White  Procedure(s) Performed: Procedure(s): TOTAL KNEE ARTHROPLASTY (Right)  Patient Location: PACU  Anesthesia Type:GA combined with regional for post-op pain  Level of Consciousness: awake and alert   Airway & Oxygen Therapy: Patient Spontanous Breathing and Patient connected to nasal cannula oxygen  Post-op Assessment: Report given to PACU RN and Post -op Vital signs reviewed and stable  Post vital signs: Reviewed and stable  Complications: No apparent anesthesia complications

## 2013-09-06 NOTE — Progress Notes (Signed)
Orthopedic Tech Progress Note Patient Details:  Bobby White 02/08/1961 [id number] CPM applied to Right LE with appropriate settings. No OHF available at this time.  CPM Right Knee CPM Right Knee: On Right Knee Flexion (Degrees): 60 Right Knee Extension (Degrees): 0   Asia R Thompson 09/06/2013, 1:20 PM

## 2013-09-07 ENCOUNTER — Encounter (HOSPITAL_COMMUNITY): Payer: Self-pay | Admitting: General Practice

## 2013-09-07 LAB — CBC
HCT: 30.7 % — ABNORMAL LOW (ref 39.0–52.0)
MCH: 28 pg (ref 26.0–34.0)
MCV: 81.9 fL (ref 78.0–100.0)
Platelets: 191 10*3/uL (ref 150–400)
RDW: 13 % (ref 11.5–15.5)
WBC: 10.7 10*3/uL — ABNORMAL HIGH (ref 4.0–10.5)

## 2013-09-07 MED ORDER — OXYCODONE HCL 5 MG PO TABS
15.0000 mg | ORAL_TABLET | ORAL | Status: DC | PRN
  Administered 2013-09-07 – 2013-09-09 (×12): 20 mg via ORAL
  Administered 2013-09-09: 15 mg via ORAL
  Administered 2013-09-09 – 2013-09-10 (×4): 20 mg via ORAL
  Administered 2013-09-10 (×4): 15 mg via ORAL
  Filled 2013-09-07 (×7): qty 4
  Filled 2013-09-07: qty 3
  Filled 2013-09-07 (×2): qty 4
  Filled 2013-09-07: qty 3
  Filled 2013-09-07: qty 4
  Filled 2013-09-07: qty 3
  Filled 2013-09-07 (×2): qty 4
  Filled 2013-09-07: qty 3
  Filled 2013-09-07 (×5): qty 4

## 2013-09-07 MED ORDER — HYDROMORPHONE HCL PF 1 MG/ML IJ SOLN
0.5000 mg | INTRAMUSCULAR | Status: DC | PRN
  Administered 2013-09-07 – 2013-09-08 (×5): 0.5 mg via INTRAVENOUS
  Filled 2013-09-07 (×6): qty 1

## 2013-09-07 NOTE — Clinical Social Work Placement (Addendum)
Clinical Social Work Department  CLINICAL SOCIAL WORK PLACEMENT NOTE  Patient: Bobby White Account Number: [id number]  Admit date: 09/06/13  Clinical Social Worker: Sabino Niemann LCSWA Date/time: 09/07/2013 2:00 PM  Clinical Social Work is seeking post-discharge placement for this patient at the following level of care: SKILLED NURSING (*CSW will update this form in Epic as items are completed)  09/07/2013 Patient/family provided with [facility] Department of Clinical Social Work's list of facilities offering this level of care within the geographic area requested by the patient (or if unable, by the patient's family).  09/07/2013 Patient/family informed of their freedom to choose among providers that offer the needed level of care, that participate in Medicare, Medicaid or managed care program needed by the patient, have an available bed and are willing to accept the patient.  09/07/2013 Patient/family informed of [facility]' ownership interest in [facility], as well as of the fact that they are under no obligation to receive care at this facility.  PASARR submitted to EDS on 09/10/2013 PASARR number received from [employer] on 09/10/2013 FL2 transmitted to all facilities in geographic area requested by pt/family on  FL2 transmitted to all facilities within larger geographic area on  Patient informed that his/her managed care company has contracts with or will negotiate with certain facilities, including the following:  Patient/family informed of bed offers received:  Patient chooses bed at [facility]  Physician recommends and patient chooses bed at  Patient to be transferred to on 09/10/2013 Patient to be transferred to facility by Private Vehicle The following physician request were entered in Epic:  Additional Comments:

## 2013-09-07 NOTE — Progress Notes (Signed)
Patient ID: Bobby White, male   DOB: 1961-03-22, 52 y.o.   MRN: [id number] PATIENT ID: Bobby White  MRN: [id number]  DOB/AGE:  [birth year] / 52 y.o.  1 Day Post-Op Procedure(s) (LRB): TOTAL KNEE ARTHROPLASTY (Right)    PROGRESS NOTE Subjective: Patient is alert, oriented, 1+ in and in Nausea, no Vomiting, yes passing gas, no Bowel Movement. Taking PO well. Denies SOB, Chest or Calf Pain. Using Incentive Spirometer, PAS in place. Ambulate yesterday, weightbearing as tolerated, CPM 0-60 Patient reports pain as 6 on 0-10 scale  .    Objective: Vital signs in last 24 hours: Filed Vitals:   09/06/13 1600 09/06/13 2154 09/07/13 0126 09/07/13 0611  BP:  140/76 154/74 141/67  Pulse:  78 89 91  Temp:  98.8 F (37.1 C) 98.6 F (37 C) 98.7 F (37.1 C)  TempSrc:  Oral Oral Oral  Resp: 16 18 18 18   SpO2:  98% 99% 98%      Intake/Output from previous day: I/O last 3 completed shifts: In: 1860 [P.O.:360; I.V.:1500] Out: 455 [Urine:425; Blood:30]   Intake/Output this shift: Total I/O In: -  Out: 2500 [Urine:2100; Drains:400]   LABORATORY DATA:  Recent Labs  09/07/13 0451  WBC 10.7*  HGB 10.5*  HCT 30.7*  PLT 191    Examination: Neurologically intact ABD soft Neurovascular intact Sensation intact distally Intact pulses distally Dorsiflexion/Plantar flexion intact Incision: no drainage No cellulitis present Compartment soft} Blood and plasma separated in drain indicating minimal recent drainage, drain pulled without difficulty.  Assessment:   1 Day Post-Op Procedure(s) (LRB): TOTAL KNEE ARTHROPLASTY (Right) ADDITIONAL DIAGNOSIS:  Hypertension, more obesity  Plan: PT/OT WBAT, CPM 5/hrs day until ROM 0-90 degrees, then D/C CPM DVT Prophylaxis:  SCDx72hrs, ASA 325 mg BID x 2 weeks DISCHARGE PLAN: Skilled Nursing Facility/Rehab, patient prefers [facility] and is working on Occupational psychologist. DISCHARGE NEEDS: HHPT, HHRN, CPM, Walker and 3-in-1 comode seat      Altheia Shafran J 09/07/2013, 6:54 AM

## 2013-09-07 NOTE — Evaluation (Addendum)
Occupational Therapy Evaluation Patient Details Name: Bobby White MRN: [id number] DOB: 19-May-1961 Today's Date: 09/07/2013 Time: 4098-1191 OT Time Calculation (min): 21 min  OT Assessment / Plan / Recommendation History of present illness Pt is a 52 y/o male s/p R TKA.   Clinical Impression   Pt presents w/ deficits as listed below (see OT problem section) affecting his independent ability to perform ADL's and tranfers safely. Will follow acutely for OT to assist with maximizing independence prior to d/c to next venue.    OT Assessment  Patient needs continued OT Services    Follow Up Recommendations  SNF/24 Hr Assistance    Barriers to Discharge Decreased caregiver support (Pt reports spouse works & cannot take time off)    Equipment Recommendations  3 in 1 bedside comode;Other (comment) (Possibly A/E)    Recommendations for Other Services    Frequency  Min 2X/week    Precautions / Restrictions Precautions Precautions: Knee;Fall Restrictions Weight Bearing Restrictions: Yes RLE Weight Bearing: Weight bearing as tolerated   Pertinent Vitals/Pain 3/10 R Knee pain (pt was pre medicated, repositioned, ice applied and increased activity performed).    ADL  Eating/Feeding: Performed;Independent Where Assessed - Eating/Feeding: Chair Grooming: Performed;Wash/dry hands;Min guard Where Assessed - Grooming: Supported standing Upper Body Bathing: Simulated;Set up Where Assessed - Upper Body Bathing: Supported sitting Lower Body Bathing: Simulated;Moderate assistance Where Assessed - Lower Body Bathing: Supported sit to stand Upper Body Dressing: Simulated;Set up Where Assessed - Upper Body Dressing: Supported sitting Lower Body Dressing: Performed;Maximal assistance (Donn socks surgical leg) Where Assessed - Lower Body Dressing: Supported sitting;Supported sit to Pharmacist, hospital: Performed;Minimal Dentist Method: Sit to Writer: Raised toilet seat with arms (or 3-in-1 over toilet);Grab bars Toileting - Clothing Manipulation and Hygiene: Performed;Minimal assistance Where Assessed - Engineer, mining and Hygiene: Standing Tub/Shower Transfer Method: Not assessed Equipment Used: Gait belt;Rolling walker;Other (comment) (3:1 & grab bars) Transfers/Ambulation Related to ADLs: Pt overall Min assist for functional transfers and ambulation in room/bathroom w/ RW. VC's for safety and sequencing. ADL Comments: Pt/spouse were educated verbally in role of OT. Pt participated toileting and functional mobiilty in room as well as for grooming standing at sink w/ assist. Pt is currently limited by pain.    OT Diagnosis: Generalized weakness;Acute pain  OT Problem List: Decreased activity tolerance;Decreased knowledge of precautions;Decreased knowledge of use of DME or AE;Pain OT Treatment Interventions: Self-care/ADL training;DME and/or AE instruction;Patient/family education;Therapeutic activities   OT Goals(Current goals can be found in the care plan section) Acute Rehab OT Goals Patient Stated Goal: Get pain under control then Rehab/SNF Time For Goal Achievement: 09/14/13 Potential to Achieve Goals: Good  Visit Information  Last OT Received On: 09/07/13 Assistance Needed: +1 History of Present Illness: Pt is a 52 y/o male s/p R TKA.       Prior Functioning     Home Living Family/patient expects to be discharged to:: Private residence Living Arrangements: Spouse/significant other Available Help at Discharge: Other (Comment) Type of Home: House Home Access: Stairs to enter Entrance Stairs-Number of Steps: 3 STE  Entrance Stairs-Rails: None Home Layout:  (Split level steps (5 up, 7 down) ) Alternate Level Stairs-Number of Steps: 5-7 Alternate Level Stairs-Rails: Left Home Equipment: Cane - single point Prior Function Level of Independence: Independent Communication Communication: No  difficulties Dominant Hand: Right    Vision/Perception Vision - History Baseline Vision: Wears glasses all the time Patient Visual Report: No change from baseline  Cognition  Cognition Arousal/Alertness: Awake/alert Behavior During Therapy: WFL for tasks assessed/performed Overall Cognitive Status: Within Functional Limits for tasks assessed    Extremity/Trunk Assessment Upper Extremity Assessment Upper Extremity Assessment: Overall WFL for tasks assessed Lower Extremity Assessment Lower Extremity Assessment: RLE deficits/detail RLE Deficits / Details: Grossly decr quad contraction postop    Mobility Bed Mobility Bed Mobility: Not assessed (Pt up in chair upon OT arrival) Supine to Sit: 4: Min guard;With rails Sitting - Scoot to Edge of Bed: 4: Min guard Details for Bed Mobility Assistance: Cues for technique Transfers Transfers: Sit to Stand;Stand to Sit Sit to Stand: 4: Min assist;With armrests;With upper extremity assist;From chair/3-in-1 Stand to Sit: 4: Min assist;With armrests;To chair/3-in-1 Details for Transfer Assistance: Cues for safety, hand placement; Required assist to rise from chair.       Balance Balance Balance Assessed: Yes Static Standing Balance Static Standing - Balance Support: Left upper extremity supported;During functional activity   End of Session OT - End of Session Equipment Utilized During Treatment: Gait belt;Rolling walker;Other (comment) (3:1 over toilet) Activity Tolerance: Patient tolerated treatment well Patient left: in chair;with call bell/phone within reach;with family/visitor present CPM Right Knee CPM Right Knee: Off  GO     Bobby White 09/07/2013, 1:01 PM

## 2013-09-07 NOTE — Progress Notes (Signed)
Physical Therapy Treatment Patient Details Name: Bobby White MRN: [id number] DOB: Jan 31, 1961 Today's Date: 09/07/2013 Time: 4098-1191 PT Time Calculation (min): 25 min  PT Assessment / Plan / Recommendation  History of Present Illness s/p RTKA   PT Comments   Much improved R stance stability; Excellent worker, though pain limiting tolerance of therex  Follow Up Recommendations  SNF;Supervision/Assistance - 24 hour     Does the patient have the potential to tolerate intense rehabilitation     Barriers to Discharge   Does not ahve 24 hour assist at home; will need to be completely independent to dc homw    Equipment Recommendations  Rolling walker with 5" wheels;3in1 (PT)    Recommendations for Other Services    Frequency 7X/week   Progress towards PT Goals Progress towards PT goals: Progressing toward goals  Plan Current plan remains appropriate    Precautions / Restrictions Precautions Precautions: Knee;Fall Restrictions Weight Bearing Restrictions: Yes RLE Weight Bearing: Weight bearing as tolerated   Pertinent Vitals/Pain 9/10 post amb R knee; patient repositioned for comfort in CPM    Mobility  Bed Mobility Bed Mobility: Sit to Supine Supine to Sit: 4: Min guard;With rails Sitting - Scoot to Edge of Bed: 4: Min guard Sit to Supine: 4: Min assist Details for Bed Mobility Assistance: Cues for technique, and min assist for RLE Transfers Transfers: Sit to Stand;Stand to Sit Sit to Stand: 3: Mod assist;From chair/3-in-1 Stand to Sit: 4: Min assist;With armrests;To chair/3-in-1 Details for Transfer Assistance: Cues for safety, hand placement; Required assist for rise from chair; pt attributes this to knee stiffness Ambulation/Gait Ambulation/Gait Assistance: 4: Min guard Ambulation Distance (Feet): 100 Feet Assistive device: Rolling walker Ambulation/Gait Assistance Details: Much improved R knee stance stability Gait Pattern: Step-through pattern General Gait  Details: emerging step-through pattern Stairs: No Wheelchair Mobility Wheelchair Mobility: No    Exercises Total Joint Exercises Quad Sets: AROM;Right;5 reps (limited by pain; pt requested we end therex) Heel Slides: AAROM;Left;5 reps Straight Leg Raises: AAROM;Left;5 reps   PT Diagnosis: Difficulty walking;Acute pain  PT Problem List: Decreased strength;Decreased range of motion;Decreased activity tolerance;Decreased mobility;Decreased knowledge of use of DME;Decreased knowledge of precautions;Pain PT Treatment Interventions: DME instruction;Gait training;Stair training;Functional mobility training;Therapeutic activities;Therapeutic exercise;Patient/family education   PT Goals (current goals can now be found in the care plan section) Acute Rehab PT Goals Patient Stated Goal: decr pain PT Goal Formulation: With patient Time For Goal Achievement: 09/14/13 Potential to Achieve Goals: Good  Visit Information  Last PT Received On: 09/07/13 Assistance Needed: +1 History of Present Illness: s/p RTKA    Subjective Data  Subjective: Likes the CPM Patient Stated Goal: decr pain   Cognition  Cognition Arousal/Alertness: Awake/alert Behavior During Therapy: WFL for tasks assessed/performed Overall Cognitive Status: Within Functional Limits for tasks assessed    Balance  Balance Balance Assessed: Yes Static Standing Balance Static Standing - Balance Support: Left upper extremity supported;During functional activity  End of Session PT - End of Session Activity Tolerance: Patient tolerated treatment well Patient left: in bed;in CPM;with call bell/phone within reach CPM Right Knee CPM Right Knee: Off   GP     [name], [place] [phone]  09/07/2013, 4:01 PM

## 2013-09-07 NOTE — Evaluation (Signed)
Physical Therapy Evaluation Patient Details Name: Bobby White MRN: [id number] DOB: 1960-12-17 Today's Date: 09/07/2013 Time: 4098-1191 PT Time Calculation (min): 22 min  PT Assessment / Plan / Recommendation History of Present Illness  Pt is a 52 y/o male s/p R TKA.  Clinical Impression  Patient is s/p above surgery resulting in functional limitations due to the deficits listed below (see PT Problem List).  Patient will benefit from skilled PT to increase their independence and safety with mobility to allow discharge to the venue listed below.       PT Assessment  Patient needs continued PT services    Follow Up Recommendations  SNF;Supervision/Assistance - 24 hour SNF to maximize independence and safety with mobility prior to dc home  May progress quite well, and if able to dc home , will recommend HHPT follow-up    Does the patient have the potential to tolerate intense rehabilitation      Barriers to Discharge   Does not ahve 24 hour assist at home; will need to be completely independent to dc home    Equipment Recommendations  Rolling walker with 5" wheels;3in1 (PT)    Recommendations for Other Services     Frequency 7X/week    Precautions / Restrictions Precautions Precautions: Knee;Fall Restrictions Weight Bearing Restrictions: Yes RLE Weight Bearing: Weight bearing as tolerated   Pertinent Vitals/Pain 3/10 R knee; RN provided medication to assist with pain control ice applied with education for skin protection       Mobility  Bed Mobility Bed Mobility: Supine to Sit;Sitting - Scoot to Edge of Bed Supine to Sit: 4: Min guard;With rails Sitting - Scoot to Edge of Bed: 4: Min guard Details for Bed Mobility Assistance: Cues for technique Transfers Transfers: Sit to Stand;Stand to Sit Sit to Stand: 3: Mod assist;From bed;4: Min assist;From chair/3-in-1 Stand to Sit: 4: Min assist;With armrests;To chair/3-in-1 Details for Transfer Assistance: Cues for  safety, hand placement; Required assist for rise from low bed Ambulation/Gait Ambulation/Gait Assistance: 4: Min guard Ambulation Distance (Feet): 50 Feet Assistive device: Rolling walker Ambulation/Gait Assistance Details: Cues for gait sequence and to activate R quad for stance stability; Buckling noted throughout walk, but pt able to steady himself quite well with RW Gait Pattern: Step-to pattern    Exercises Total Joint Exercises Quad Sets: AROM;Right;10 reps Heel Slides: AAROM;Left;5 reps Straight Leg Raises: AAROM;Left;5 reps   PT Diagnosis: Difficulty walking;Acute pain  PT Problem List: Decreased strength;Decreased range of motion;Decreased activity tolerance;Decreased mobility;Decreased knowledge of use of DME;Decreased knowledge of precautions;Pain PT Treatment Interventions: DME instruction;Gait training;Stair training;Functional mobility training;Therapeutic activities;Therapeutic exercise;Patient/family education     PT Goals(Current goals can be found in the care plan section) Acute Rehab PT Goals Patient Stated Goal: decr pain PT Goal Formulation: With patient Time For Goal Achievement: 09/14/13 Potential to Achieve Goals: Good  Visit Information  Last PT Received On: 09/07/13 Assistance Needed: +1 History of Present Illness: Pt is a 52 y/o male s/p R TKA.       Prior Functioning  Home Living Family/patient expects to be discharged to:: Private residence Living Arrangements: Spouse/significant other Available Help at Discharge: Other (Comment) Type of Home: House Home Access: Stairs to enter Entrance Stairs-Number of Steps: 3 STE  Entrance Stairs-Rails: None Home Layout:  (Split level steps (5 up, 7 down) ) Alternate Level Stairs-Number of Steps: 5-7 Alternate Level Stairs-Rails: Left Home Equipment: Cane - single point Prior Function Level of Independence: Independent Communication Communication: No difficulties Dominant Hand: Right  Cognition   Cognition Arousal/Alertness: Awake/alert Behavior During Therapy: WFL for tasks assessed/performed Overall Cognitive Status: Within Functional Limits for tasks assessed    Extremity/Trunk Assessment Upper Extremity Assessment Upper Extremity Assessment: Overall WFL for tasks assessed Lower Extremity Assessment Lower Extremity Assessment: RLE deficits/detail RLE Deficits / Details: Grossly decr quad contraction postop   Balance    End of Session    GP     Olen Pel [place], [place] [phone]  09/07/2013, 12:55 PM

## 2013-09-08 DIAGNOSIS — M1711 Unilateral primary osteoarthritis, right knee: Secondary | ICD-10-CM | POA: Diagnosis present

## 2013-09-08 LAB — CBC
Hemoglobin: 10.4 g/dL — ABNORMAL LOW (ref 13.0–17.0)
MCH: 27.5 pg (ref 26.0–34.0)
MCHC: 32.9 g/dL (ref 30.0–36.0)
MCV: 83.6 fL (ref 78.0–100.0)

## 2013-09-08 MED ORDER — ASPIRIN EC 325 MG PO TBEC: 325.0000 mg | DELAYED_RELEASE_TABLET | Freq: Two times a day (BID) | ORAL | Status: DC

## 2013-09-08 MED ORDER — OXYCODONE-ACETAMINOPHEN 5-325 MG PO TABS: 1.0000 | ORAL_TABLET | ORAL | Status: DC | PRN

## 2013-09-08 MED ORDER — METHOCARBAMOL 500 MG PO TABS: 500.0000 mg | ORAL_TABLET | Freq: Two times a day (BID) | ORAL | Status: DC

## 2013-09-08 NOTE — Discharge Summary (Deleted)
Patient ID: Bobby [name] MRN: [id number] DOB/AGE: 54/15/1962 54 y.o.  Admit date: 09/06/2013 Discharge date: [name]  Admission Diagnoses:  Active Problems:   * No active hospital problems. *   Discharge Diagnoses:  Same  Past Medical History  Diagnosis Date  . Obesity   . Impaired fasting glucose 2009  . Varicose vein   . Hypertension 1/10  . GERD (gastroesophageal reflux disease)   . Arthritis   . Hepatitis     Hep C --had to get shots  . Sleep apnea   . Coronary artery disease     Surgeries: Procedure(s): TOTAL KNEE ARTHROPLASTY on 09/06/2013   Consultants:    Discharged Condition: Improved  Hospital Course: Bobby [name] is an 52 y.o. male who was admitted 09/06/2013 for operative treatment of<principal problem not specified>. Patient has severe unremitting pain that affects sleep, daily activities, and work/hobbies. After pre-op clearance the patient was taken to the operating room on 09/06/2013 and underwent  Procedure(s): TOTAL KNEE ARTHROPLASTY.    Patient was given perioperative antibiotics: Anti-infectives   Start     Dose/Rate Route Frequency Ordered Stop   09/06/13 1023  cefUROXime (ZINACEF) injection  Status:  Discontinued       As needed 09/06/13 1023 09/06/13 1147   09/06/13 0600  ceFAZolin (ANCEF) 3 g in dextrose 5 % 50 mL IVPB     3 g 160 mL/hr over 30 Minutes Intravenous On call to O.R. 09/05/13 1556 09/06/13 0930       Patient was given sequential compression devices, early ambulation, and chemoprophylaxis to prevent DVT.  Patient benefited maximally from hospital stay and there were no complications.    Recent vital signs: Patient Vitals for the past 24 hrs:  BP Temp Pulse Resp SpO2  09/08/13 0521 136/71 mmHg 99.2 F (37.3 C) 93 18 96 %  09/07/13 2124 143/78 mmHg 99.3 F (37.4 C) 96 18 97 %  09/07/13 1400 158/68 mmHg 99.2 F (37.3 C) 91 20 96 %  09/07/13 0946 141/67 mmHg - - - -     Recent laboratory studies:  Recent  Labs  09/07/13 0451 09/08/13 0500  WBC 10.7* 10.1  HGB 10.5* 10.4*  HCT 30.7* 31.6*  PLT 191 189     Discharge Medications:     Medication List    STOP taking these medications       aspirin 325 MG tablet  Replaced by:  aspirin EC 325 MG tablet     HYDROcodone-acetaminophen 10-325 MG per tablet  Commonly known as:  NORCO     ibuprofen 800 MG tablet  Commonly known as:  ADVIL,MOTRIN     traMADol 50 MG tablet  Commonly known as:  ULTRAM      TAKE these medications       albuterol 108 (90 BASE) MCG/ACT inhaler  Commonly known as:  PROVENTIL HFA;VENTOLIN HFA  Inhale 2 puffs into the lungs every 6 (six) hours as needed for wheezing.     amLODipine 5 MG tablet  Commonly known as:  NORVASC  Take 5 mg by mouth daily.     aspirin EC 325 MG tablet  Take 1 tablet (325 mg total) by mouth 2 (two) times daily.     Cinnamon 500 MG capsule  Take 500 mg by mouth 2 (two) times daily.     KRILL OIL PO  Take 1 capsule by mouth daily.     L-CARNITINE TRIPLE STRENGTH 1500-10 MG/15ML Liqd  Generic drug:  Levocarnitine-Ca Pantothenate  Take  10 mLs by mouth daily.     magnesium gluconate 500 MG tablet  Commonly known as:  MAGONATE  Take 1,000 mg by mouth daily.     meloxicam 15 MG tablet  Commonly known as:  MOBIC  Take 15 mg by mouth daily.     methocarbamol 500 MG tablet  Commonly known as:  ROBAXIN  Take 1 tablet (500 mg total) by mouth 2 (two) times daily with a meal.     metoprolol succinate 25 MG 24 hr tablet  Commonly known as:  TOPROL-XL  Take 25 mg by mouth daily.     nitroGLYCERIN 0.4 MG SL tablet  Commonly known as:  NITROSTAT  Place 0.4 mg under the tongue every 5 (five) minutes as needed for chest pain.     omeprazole 20 MG capsule  Commonly known as:  PRILOSEC  Take 20-40 mg by mouth daily. May occasionally take 40 mg depending on how severe heart burn is.     oxyCODONE-acetaminophen 5-325 MG per tablet  Commonly known as:  ROXICET  Take 1 tablet by  mouth every 4 (four) hours as needed for pain.     simvastatin 40 MG tablet  Commonly known as:  ZOCOR  Take 40 mg by mouth every evening.     topiramate 50 MG tablet  Commonly known as:  TOPAMAX  Take 50 mg by mouth daily as needed (appetite).        Diagnostic Studies: No results found.  Disposition: Final discharge disposition not confirmed      Discharge Orders   Future Appointments Provider Department Dept Phone   10/26/2013 8:15 AM Agapito Games, MD [facility] [place] [phone]   Future Orders Complete By Expires   Call MD / Call 911  As directed    Comments:     If you experience chest pain or shortness of breath, CALL 911 and be transported to the hospital emergency room.  If you develope a fever above 101 F, pus (white drainage) or increased drainage or redness at the wound, or calf pain, call your surgeon's office.   Change dressing  As directed    Comments:     Change dressing on 5, then change the dressing daily with sterile 4 x 4 inch gauze dressing and apply TED hose.  You may clean the incision with alcohol prior to redressing.   Constipation Prevention  As directed    Comments:     Drink plenty of fluids.  Prune juice may be helpful.  You may use a stool softener, such as Colace (over the counter) 100 mg twice a day.  Use MiraLax (over the counter) for constipation as needed.   CPM  As directed    Comments:     Continuous passive motion machine (CPM):      Use the CPM from 0 to 60  for 5 hours per day.      You may increase by 10 degrees per day.  You may break it up into 2 or 3 sessions per day.      Use CPM for 2 weeks or until you are told to stop.   Diet - low sodium heart healthy  As directed    Discharge instructions  As directed    Comments:     Follow up in 2 weeks with Dr. Turner Daniels in office   Driving restrictions  As directed    Comments:     No driving for 2  weeks   Increase activity slowly as tolerated  As  directed    Patient may shower  As directed    Comments:     You may shower without a dressing once there is no drainage.  Do not wash over the wound.  If drainage remains, cover wound with plastic wrap and then shower.      Follow-up Information   Follow up with Nestor Lewandowsky, MD In 2 weeks.   Specialty:  Orthopedic Surgery   Contact information:   [address] [place] [address] [phone]        Signed: Vear Clock Atlee Kluth R [name], 8:04 AM

## 2013-09-08 NOTE — Progress Notes (Signed)
PATIENT ID: Bobby [name]  MRN: [id number]  DOB/AGE:  September 04, 1961 / 52 y.o.  2 Days Post-Op Procedure(s) (LRB): TOTAL KNEE ARTHROPLASTY (Right)    PROGRESS NOTE Subjective: Patient is alert, oriented, no Nausea, no Vomiting, yes passing gas, no Bowel Movement. Taking PO well. Denies SOB, Chest or Calf Pain. Using Incentive Spirometer, PAS in place. Ambulate WBAT, CPM 0-60 Patient reports pain as moderate  .    Objective: Vital signs in last 24 hours: Filed Vitals:   09/07/13 0946 09/07/13 1400 09/07/13 2124 09/08/13 0521  BP: 141/67 158/68 143/78 136/71  Pulse:  91 96 93  Temp:  99.2 F (37.3 C) 99.3 F (37.4 C) 99.2 F (37.3 C)  TempSrc:      Resp:  20 18 18   SpO2:  96% 97% 96%      Intake/Output from previous day: I/O last 3 completed shifts: In: 600 [P.O.:600] Out: 4250 [Urine:3850; Drains:400]   Intake/Output this shift:     LABORATORY DATA:  Recent Labs  09/07/13 0451 09/08/13 0500  WBC 10.7* 10.1  HGB 10.5* 10.4*  HCT 30.7* 31.6*  PLT 191 189    Examination: Neurologically intact Neurovascular intact Sensation intact distally Intact pulses distally Dorsiflexion/Plantar flexion intact Incision: scant drainage No cellulitis present Compartment soft}  Assessment:   2 Days Post-Op Procedure(s) (LRB): TOTAL KNEE ARTHROPLASTY (Right) ADDITIONAL DIAGNOSIS:  Hypertension and obesity  Plan: PT/OT WBAT, CPM 5/hrs day until ROM 0-90 degrees, then D/C CPM DVT Prophylaxis:  SCDx72hrs, ASA 325 mg BID x 2 weeks DISCHARGE PLAN: Skilled Nursing Facility/Rehab [name] when cleared by PT. DISCHARGE NEEDS: HHPT, HHRN, CPM, Walker and 3-in-1 comode seat     Bobby [name] 09/08/2013, 7:58 AM

## 2013-09-08 NOTE — Progress Notes (Signed)
Occupational Therapy Treatment Patient Details Name: Bobby [name] MRN: [id number] DOB: 1961-05-03 Today's Date: 09/08/2013 Time: 4098-1191 OT Time Calculation (min): 23 min  OT Assessment / Plan / Recommendation       OT comments  Pt. Awake and required encouragement for participation secondary to increased c/o pain.  Mod a for bed mobility for management of RLE.  Min. A for transfer to recliner with height of bed elevated.  Upon sitting down, pt. With increased c/o pain and nausea and dizziness.  rn notified of pt. Complaints/concerns.  Tx. Session/progress limited due to pt.s pain and dizziness  Follow Up Recommendations  SNF           Equipment Recommendations  3 in 1 bedside comode    Recommendations for Other Services    Frequency Min 2X/week   Progress towards OT Goals Progress towards OT goals: Progressing toward goals  Plan Discharge plan remains appropriate    Precautions / Restrictions Precautions Precautions: Knee;Fall Restrictions RLE Weight Bearing: Weight bearing as tolerated   Pertinent Vitals/Pain 8/10, rn notified and brought pain meds during session.  Also informed her of nausea and dizziness complaints    ADL  Toilet Transfer: Simulated;Minimal assistance Toilet Transfer Method: Sit to stand;Stand pivot Toilet Transfer Equipment: Other (comment) (eob to recliner) Toileting - Clothing Manipulation and Hygiene: Simulated;Minimal assistance Where Assessed - Toileting Clothing Manipulation and Hygiene: Standing Transfers/Ambulation Related to ADLs: min a for sit/stand from elevated surface, cues for rle managment and positioning to ease burden with steps, also with pushing through b ues into walker for support ADL Comments: sim. components of toileting secondary to pt. very limited due to pain levels.        OT Goals(current goals can now be found in the care plan section)    Visit Information  Last OT Received On: 09/08/13    Subjective Data  "it seems like i am always in pain when someone needs to work with me"          Cognition  Cognition Arousal/Alertness: Awake/alert Behavior During Therapy: WFL for tasks assessed/performed Overall Cognitive Status: Within Functional Limits for tasks assessed    Mobility  Bed Mobility Details for Bed Mobility Assistance: cues for tech, mod a for moving RLE out of bed Transfers Transfers: Sit to Stand;Stand to Sit Sit to Stand: 4: Min assist;With upper extremity assist;From elevated surface;From bed Stand to Sit: 4: Min assist;With upper extremity assist;With armrests;To chair/3-in-1 Details for Transfer Assistance: cues for safety and hand placement, also for sliding r le forward prior to sitting down               End of Session OT - End of Session Equipment Utilized During Treatment: Rolling walker Activity Tolerance: Patient limited by pain Patient left: in chair;with call bell/phone within reach;with family/visitor present       Robet Leu, COTA/L 09/08/2013, 8:25 AM

## 2013-09-08 NOTE — Progress Notes (Signed)
Physical Therapy Treatment Patient Details Name: Bobby [name] MRN: [id number] DOB: Sep 05, 1961 Today's Date: 09/08/2013 Time: 1003-1020 PT Time Calculation (min): 17 min  PT Assessment / Plan / Recommendation  History of Present Illness s/p RTKA   PT Comments   Progress today limited by pain, and decr tolerance of upright activity, with pt reporting dizziness; continue to recommend SNF to maximize independence and safety with mobility prior to dc home   Follow Up Recommendations  SNF;Supervision/Assistance - 24 hour     Does the patient have the potential to tolerate intense rehabilitation     Barriers to Discharge        Equipment Recommendations  Rolling walker with 5" wheels;3in1 (PT)    Recommendations for Other Services    Frequency 7X/week   Progress towards PT Goals Progress towards PT goals: Progressing toward goals (though quite slowly)  Plan Current plan remains appropriate    Precautions / Restrictions Precautions Precautions: Knee;Fall Restrictions Weight Bearing Restrictions: Yes RLE Weight Bearing: Weight bearing as tolerated   Pertinent Vitals/Pain 7/10 R knee with amb patient repositioned for comfort and optimal extension    Mobility  Bed Mobility Details for Bed Mobility Assistance: in chair upon arrival Transfers Transfers: Sit to Stand;Stand to Sit Sit to Stand: 4: Min assist;With upper extremity assist;From elevated surface;From bed Stand to Sit: 4: Min assist;With upper extremity assist;With armrests;To chair/3-in-1 Details for Transfer Assistance: cues for safety and hand placement, also for sliding r le forward prior to sitting down Ambulation/Gait Ambulation/Gait Assistance: 4: Min guard Ambulation Distance (Feet): 50 Feet Assistive device: Rolling walker Ambulation/Gait Assistance Details: decr tolerance of upright activity today compared to yesterday afternoon; reported dizziness with amb , so pushed chair behind; Cues for R stance  stability Gait Pattern: Step-through pattern;Decreased stride length;Antalgic    Exercises Total Joint Exercises Long Arc Quad: Bobby [name];Right;Strengthening;5 reps Knee Flexion: AAROM;Right;5 reps;Seated   PT Diagnosis:    PT Problem List:   PT Treatment Interventions:     PT Goals (current goals can now be found in the care plan section) Acute Rehab PT Goals PT Goal Formulation: With patient Time For Goal Achievement: 09/14/13 Potential to Achieve Goals: Good  Visit Information  Last PT Received On: 09/08/13 Assistance Needed: +1 History of Present Illness: s/p RTKA    Subjective Data  Subjective: A lot of pain today   Cognition  Cognition Arousal/Alertness: Awake/alert Behavior During Therapy: WFL for tasks assessed/performed Overall Cognitive Status: Within Functional Limits for tasks assessed    Balance     End of Session PT - End of Session Activity Tolerance: Patient limited by pain;Patient limited by fatigue Patient left: in chair;with call bell/phone within reach CPM Right Knee CPM Right Knee: Off   GP     Bobby [name] [place], [place] [phone]  09/08/2013, 12:25 PM

## 2013-09-08 NOTE — Progress Notes (Signed)
Physical Therapy Treatment Patient Details Name: Bobby [name] MRN: [id number] DOB: 1961/02/14 Today's Date: 09/08/2013 Time: 4098-1191 PT Time Calculation (min): 23 min  PT Assessment / Plan / Recommendation  History of Present Illness s/p RTKA   PT Comments   Pt slowly progressing toward his therapy goals.  Follow Up Recommendations  SNF;Supervision/Assistance - 24 hour     Equipment Recommendations  Rolling walker with 5" wheels;3in1 (PT)       Frequency 7X/week   Progress towards PT Goals Progress towards PT goals: Progressing toward goals  Plan Current plan remains appropriate    Precautions / Restrictions Precautions Precautions: Knee;Fall Restrictions RLE Weight Bearing: Weight bearing as tolerated       Mobility  Bed Mobility Bed Mobility: Not assessed    Exercises Total Joint Exercises Ankle Circles/Pumps: AROM;Both;10 reps;Supine Quad Sets: AROM;Strengthening;Right;10 reps;Supine Heel Slides: AAROM;Right;Strengthening;5 reps;Supine Hip ABduction/ADduction: AAROM;Strengthening;Right;10 reps;Supine Straight Leg Raises: AAROM;Strengthening;Right;5 reps;Supine    PT Goals (current goals can now be found in the care plan section)   Visit Information  Last PT Received On: 09/08/13 Assistance Needed: +1 History of Present Illness: s/p RTKA       Cognition  Cognition Arousal/Alertness: Awake/alert Behavior During Therapy: WFL for tasks assessed/performed Overall Cognitive Status: Within Functional Limits for tasks assessed       End of Session PT - End of Session Activity Tolerance: Patient limited by pain Patient left: in bed;with call bell/phone within reach;in CPM;with family/visitor present Nurse Communication: Mobility status;Patient requests pain meds CPM Right Knee CPM Right Knee: Other (Comment) Right Knee Flexion (Degrees): 60 Right Knee Extension (Degrees): 0 Additional Comments: off for ther ex and then reapplied at end of session    GP     Sallyanne Kuster 09/08/2013, 4:26 PM  Sallyanne Kuster, PTA Office- [phone]

## 2013-09-09 LAB — CBC
HCT: 30.9 % — ABNORMAL LOW (ref 39.0–52.0)
MCH: 28.6 pg (ref 26.0–34.0)
MCHC: 34.3 g/dL (ref 30.0–36.0)
MCV: 83.5 fL (ref 78.0–100.0)
Platelets: 227 10*3/uL (ref 150–400)
RDW: 13.6 % (ref 11.5–15.5)
WBC: 9.4 10*3/uL (ref 4.0–10.5)

## 2013-09-09 LAB — TISSUE CULTURE

## 2013-09-09 MED ORDER — BISACODYL 10 MG RE SUPP
10.0000 mg | Freq: Every day | RECTAL | Status: DC | PRN
  Administered 2013-09-09: 10 mg via RECTAL

## 2013-09-09 MED ORDER — MAGNESIUM HYDROXIDE 400 MG/5ML PO SUSP
30.0000 mL | Freq: Every day | ORAL | Status: DC | PRN
  Administered 2013-09-09: 30 mL via ORAL
  Filled 2013-09-09: qty 30

## 2013-09-09 NOTE — Progress Notes (Signed)
Still awaiting [employer] authorization.   Sabino Niemann, MSW, [employer] [phone]

## 2013-09-09 NOTE — Progress Notes (Signed)
Patient ID: LENNY FIUMARA, male   DOB: 1961-03-09, 52 y.o.   MRN: [id number] PATIENT ID: JAYMISON LUBER  MRN: [id number]  DOB/AGE:  08-Aug-1961 / 52 y.o.  3 Days Post-Op Procedure(s) (LRB): TOTAL KNEE ARTHROPLASTY (Right)    PROGRESS NOTE Subjective: Patient is alert, oriented, 1x Nausea, no Vomiting, yes passing gas, no Bowel Movement. Taking PO well. Denies SOB, Chest or Calf Pain. Using Incentive Spirometer, PAS in place. Ambulate WBAT in room, CPM 0-7- Patient reports pain as 5 on 0-10 scale  .    Objective: Vital signs in last 24 hours: Filed Vitals:   09/08/13 1339 09/08/13 1418 09/08/13 2019 09/09/13 0550  BP: 138/79  141/69 136/75  Pulse: 98  99 97  Temp: 98.7 F (37.1 C)  99.7 F (37.6 C) 98.5 F (36.9 C)  TempSrc: Oral  Oral Oral  Resp: 18  18 20   Height:  5\' 11"  (1.803 m)    Weight:  124 kg (273 lb 5.9 oz)    SpO2: 98%  98% 98%      Intake/Output from previous day: I/O last 3 completed shifts: In: 920 [P.O.:920] Out: 1100 [Urine:1100]   Intake/Output this shift:     LABORATORY DATA:  Recent Labs  09/08/13 0500 09/09/13 0530  WBC 10.1 9.4  HGB 10.4* 10.6*  HCT 31.6* 30.9*  PLT 189 227    Examination: Neurologically intact ABD soft Neurovascular intact Sensation intact distally Intact pulses distally Dorsiflexion/Plantar flexion intact Incision: no drainage No cellulitis present Compartment soft}  Assessment:   3 Days Post-Op Procedure(s) (LRB): TOTAL KNEE ARTHROPLASTY (Right) ADDITIONAL DIAGNOSIS:  CAD, HTN, sleep Apnea, morbid obesity  Plan: PT/OT WBAT, CPM 5/hrs day until ROM 0-90 degrees, then D/C CPM DVT Prophylaxis:  SCDx72hrs, ASA 325 mg BID x 2 weeks DISCHARGE PLAN: Skilled Nursing Facility/Rehab, recomende by PT and preferred by patient, pending insurance approval DISCHARGE NEEDS: HHPT, HHRN, CPM, Walker and 3-in-1 comode seat     Kaydense Rizo J 09/09/2013, 7:35 AM

## 2013-09-09 NOTE — Progress Notes (Signed)
Physical Therapy Treatment Patient Details Name: Bobby White MRN: [id number] DOB: 19-Jul-1961 Today's Date: 09/09/2013 Time: 4098-1191 PT Time Calculation (min): 25 min  PT Assessment / Plan / Recommendation  History of Present Illness s/p RTKA   PT Comments   Pt's progress cont's to be limited by pain & reports of nausea today.  Only able to increase ambulation distance short amount.    Follow Up Recommendations  SNF;Supervision/Assistance - 24 hour     Does the patient have the potential to tolerate intense rehabilitation     Barriers to Discharge        Equipment Recommendations  Rolling walker with 5" wheels;3in1 (PT)    Recommendations for Other Services    Frequency 7X/week   Progress towards PT Goals Progress towards PT goals: Progressing toward goals  Plan Current plan remains appropriate    Precautions / Restrictions Precautions Precautions: Knee;Fall Restrictions RLE Weight Bearing: Weight bearing as tolerated   Pertinent Vitals/Pain 6/10 Rt knee.  Premedicated.  Repositioned for comfort & optimal knee extension.      Mobility  Bed Mobility Bed Mobility: Supine to Sit;Sitting - Scoot to Edge of Bed Supine to Sit: 4: Min assist;HOB flat Sitting - Scoot to [place] of Bed: 4: Min assist Details for Bed Mobility Assistance: (A) for RLE management & support.   Transfers Transfers: Sit to Stand;Stand to Sit Sit to Stand: 4: Min guard;With upper extremity assist;From bed Stand to Sit: 4: Min guard;With upper extremity assist;With armrests;To chair/3-in-1 Details for Transfer Assistance: Incr time Ambulation/Gait Ambulation/Gait Assistance: 4: Min guard Ambulation Distance (Feet): 60 Feet Assistive device: Rolling walker Ambulation/Gait Assistance Details: pt relies heavily on RW with UE's & decreased tolerance for WBing due to pain.   Gait Pattern: Step-through pattern;Decreased weight shift to right;Decreased step length - left;Antalgic Gait velocity:  decreased Stairs: No Wheelchair Mobility Wheelchair Mobility: No    Exercises Total Joint Exercises Ankle Circles/Pumps: AROM;Both;10 reps Quad Sets: AROM;Both;10 reps Straight Leg Raises: AAROM;Right;10 reps;Supine Knee Flexion: AAROM;Right;5 reps;Seated;Limitations Knee Flexion Limitations: limited by pain Goniometric ROM: PROM Rt knee flexion ~75 degrees in sitting     PT Goals (current goals can now be found in the care plan section) Acute Rehab PT Goals PT Goal Formulation: With patient Time For Goal Achievement: 09/14/13 Potential to Achieve Goals: Good  Visit Information  Last PT Received On: 09/09/13 Assistance Needed: +1 History of Present Illness: s/p RTKA    Subjective Data  Subjective: "how long does it normally take someone to get back to doing normal things?"   Cognition  Cognition Arousal/Alertness: Awake/alert Behavior During Therapy: WFL for tasks assessed/performed Overall Cognitive Status: Within Functional Limits for tasks assessed    Balance     End of Session PT - End of Session Equipment Utilized During Treatment: Gait belt Activity Tolerance: Patient limited by pain Patient left: in chair;with call bell/phone within reach Nurse Communication: Mobility status    GP     Lara Mulch 09/09/2013, 11:35 AM  Verdell Face, PTA [phone] 09/09/2013    Verdell Face, PTA [phone] 09/09/2013

## 2013-09-10 NOTE — Discharge Summary (Signed)
Patient ID: Bobby White MRN: [id number] DOB/AGE: 54-Sep-1962 54 y.o.  Admit date: 09/06/2013 Discharge date: 09/10/2013  Admission Diagnoses:  Principal Problem:   Arthritis of knee, right   Discharge Diagnoses:  Same  Past Medical History  Diagnosis Date  . Obesity   . Impaired fasting glucose 2009  . Varicose vein   . Hypertension 1/10  . GERD (gastroesophageal reflux disease)   . Arthritis   . Hepatitis     Hep C --had to get shots  . Sleep apnea   . Coronary artery disease     Surgeries: Procedure(s): TOTAL KNEE ARTHROPLASTY on 09/06/2013   Consultants:    Discharged Condition: Improved  Hospital Course: MALAKI KOURY is an 52 y.o. male who was admitted 09/06/2013 for operative treatment ofArthritis of knee, right. Patient has severe unremitting pain that affects sleep, daily activities, and work/hobbies. After pre-op clearance the patient was taken to the operating room on 09/06/2013 and underwent  Procedure(s): TOTAL KNEE ARTHROPLASTY.    Patient was given perioperative antibiotics: Anti-infectives   Start     Dose/Rate Route Frequency Ordered Stop   09/06/13 1023  cefUROXime (ZINACEF) injection  Status:  Discontinued       As needed 09/06/13 1023 09/06/13 1147   09/06/13 0600  ceFAZolin (ANCEF) 3 g in dextrose 5 % 50 mL IVPB     3 g 160 mL/hr over 30 Minutes Intravenous On call to O.R. 09/05/13 1556 09/06/13 0930       Patient was given sequential compression devices, early ambulation, and chemoprophylaxis to prevent DVT.  Patient benefited maximally from hospital stay and there were no complications.    Recent vital signs: Patient Vitals for the past 24 hrs:  BP Temp Pulse Resp SpO2  09/10/13 0532 122/63 mmHg 98.6 F (37 C) 89 16 92 %  09/10/13 0400 - - - 16 -  09/09/13 2354 - - - 16 -  09/09/13 2051 123/66 mmHg 98.5 F (36.9 C) 87 18 95 %  09/09/13 2000 - - - 17 -  09/09/13 1600 - - - 20 -  09/09/13 1336 148/85 mmHg 99.4 F (37.4 C) 94  18 100 %  09/09/13 1200 - - - 16 -  09/09/13 0800 - - - 20 -     Recent laboratory studies:  Recent Labs  09/08/13 0500 09/09/13 0530  WBC 10.1 9.4  HGB 10.4* 10.6*  HCT 31.6* 30.9*  PLT 189 227     Discharge Medications:     Medication List    STOP taking these medications       aspirin 325 MG tablet  Replaced by:  aspirin EC 325 MG tablet     HYDROcodone-acetaminophen 10-325 MG per tablet  Commonly known as:  NORCO     ibuprofen 800 MG tablet  Commonly known as:  ADVIL,MOTRIN     traMADol 50 MG tablet  Commonly known as:  ULTRAM      TAKE these medications       albuterol 108 (90 BASE) MCG/ACT inhaler  Commonly known as:  PROVENTIL HFA;VENTOLIN HFA  Inhale 2 puffs into the lungs every 6 (six) hours as needed for wheezing.     amLODipine 5 MG tablet  Commonly known as:  NORVASC  Take 5 mg by mouth daily.     aspirin EC 325 MG tablet  Take 1 tablet (325 mg total) by mouth 2 (two) times daily.     Cinnamon 500 MG capsule  Take 500 mg by  mouth 2 (two) times daily.     KRILL OIL PO  Take 1 capsule by mouth daily.     L-CARNITINE TRIPLE STRENGTH 1500-10 MG/15ML Liqd  Generic drug:  Levocarnitine-Ca Pantothenate  Take 10 mLs by mouth daily.     magnesium gluconate 500 MG tablet  Commonly known as:  MAGONATE  Take 1,000 mg by mouth daily.     meloxicam 15 MG tablet  Commonly known as:  MOBIC  Take 15 mg by mouth daily.     methocarbamol 500 MG tablet  Commonly known as:  ROBAXIN  Take 1 tablet (500 mg total) by mouth 2 (two) times daily with a meal.     metoprolol succinate 25 MG 24 hr tablet  Commonly known as:  TOPROL-XL  Take 25 mg by mouth daily.     nitroGLYCERIN 0.4 MG SL tablet  Commonly known as:  NITROSTAT  Place 0.4 mg under the tongue every 5 (five) minutes as needed for chest pain.     omeprazole 20 MG capsule  Commonly known as:  PRILOSEC  Take 20-40 mg by mouth daily. May occasionally take 40 mg depending on how severe heart  burn is.     oxyCODONE-acetaminophen 5-325 MG per tablet  Commonly known as:  ROXICET  Take 1 tablet by mouth every 4 (four) hours as needed for pain.     simvastatin 40 MG tablet  Commonly known as:  ZOCOR  Take 40 mg by mouth every evening.     topiramate 50 MG tablet  Commonly known as:  TOPAMAX  Take 50 mg by mouth daily as needed (appetite).        Diagnostic Studies: No results found.  Disposition: Final discharge disposition not confirmed      Discharge Orders   Future Appointments Provider Department Dept Phone   10/26/2013 8:15 AM Agapito Games, MD [facility] [place] [phone]   Future Orders Complete By Expires   Call MD / Call 911  As directed    Comments:     If you experience chest pain or shortness of breath, CALL 911 and be transported to the hospital emergency room.  If you develope a fever above 101 F, pus (white drainage) or increased drainage or redness at the wound, or calf pain, call your surgeon's office.   Change dressing  As directed    Comments:     Change dressing on 5, then change the dressing daily with sterile 4 x 4 inch gauze dressing and apply TED hose.  You may clean the incision with alcohol prior to redressing.   Constipation Prevention  As directed    Comments:     Drink plenty of fluids.  Prune juice may be helpful.  You may use a stool softener, such as Colace (over the counter) 100 mg twice a day.  Use MiraLax (over the counter) for constipation as needed.   CPM  As directed    Comments:     Continuous passive motion machine (CPM):      Use the CPM from 0 to 60  for 5 hours per day.      You may increase by 10 degrees per day.  You may break it up into 2 or 3 sessions per day.      Use CPM for 2 weeks or until you are told to stop.   Diet - low sodium heart healthy  As directed    Discharge instructions  As directed  Comments:     Follow up in 2 weeks with Dr. Turner Daniels in office   Driving  restrictions  As directed    Comments:     No driving for 2 weeks   Increase activity slowly as tolerated  As directed    Patient may shower  As directed    Comments:     You may shower without a dressing once there is no drainage.  Do not wash over the wound.  If drainage remains, cover wound with plastic wrap and then shower.      Follow-up Information   Follow up with Nestor Lewandowsky, MD In 2 weeks.   Specialty:  Orthopedic Surgery   Contact information:   [address] [place] [address] [phone]        Signed: Vear Clock Rainey Kahrs R 09/10/2013, 7:19 AM

## 2013-09-10 NOTE — Clinical Social Work Psychosocial (Signed)
Clinical Social Work Department  BRIEF PSYCHOSOCIAL ASSESSMENT  Patient: Bobby White  Account Number: [id number]   Admit date: 09/06/13 Clinical Social Worker Sabino Niemann, MSW Date/Time: 09/07/13 3:17 PM Referred by: Physician Date Referred: 09/06/13 Referred for   SNF Placement   Other Referral:  Interview type: Patient  Other interview type: PSYCHOSOCIAL DATA  Living Status:wife Admitted from facility:  Level of care:  Primary support name: Glaza,Jackie  Primary support relationship to patient: Wife Degree of support available:  Strong and vested  CURRENT CONCERNS  Current Concerns   Post-Acute Placement   Other Concerns:  SOCIAL WORK ASSESSMENT / PLAN  CSW met with pt re: PT recommendation for SNF.   Pt lives with his wife  CSW explained placement process and answered questions.   Pt reports [facility]  as his  preference    CSW completed FL2 and initiated SNF search.     Assessment/plan status: Information/Referral to [facility]  Other assessment/ plan:  Information/referral to community resources:  SNF     PATIENT'S/FAMILY'S RESPONSE TO PLAN OF CARE:  Pt  reports he is agreeable to ST SNF in order to increase strength and independence with mobility prior to returning home  Pt verbalized understanding of placement process and appreciation for CSW assist.   Sabino Niemann, MSW, LCSWA [phone]

## 2013-09-10 NOTE — Progress Notes (Signed)
Physical Therapy Treatment Patient Details Name: CAYETANO MIKITA MRN: [id number] DOB: May 06, 1961 Today's Date: 09/10/2013 Time: 4098-1191 PT Time Calculation (min): 34 min  PT Assessment / Plan / Recommendation  History of Present Illness 52 y.o male admitted to [facility] on 09/06/13 for elective R TKA.  He is WBAT, no knee brace.     PT Comments   Pt is POD #4 and is moving well, albeit slowly.  He progresses his gait distance daily and continues to struggle with right quad activation during TE.  He continues to be appropriate for SNF level therapy at discharge and is planning on going to [facility] (possibly later today).    Follow Up Recommendations  SNF     Does the patient have the potential to tolerate intense rehabilitation    NA  Barriers to Discharge   None      Equipment Recommendations  Rolling walker with 5" wheels;3in1 (PT)    Recommendations for Other Services   None  Frequency 7X/week   Progress towards PT Goals Progress towards PT goals: Progressing toward goals  Plan Current plan remains appropriate    Precautions / Restrictions Precautions Precautions: Knee;Fall Restrictions Weight Bearing Restrictions: Yes RLE Weight Bearing: Weight bearing as tolerated   Pertinent Vitals/Pain See vitals flow sheet.    Mobility  Bed Mobility Bed Mobility: Not assessed (pt already OOB in the chair) Transfers Sit to Stand: 5: Supervision;With upper extremity assist;With armrests;From chair/3-in-1 Stand to Sit: 5: Supervision;Without upper extremity assist;With armrests;To chair/3-in-1 Details for Transfer Assistance: supervision for safety due to increase time needed for transitions Ambulation/Gait Ambulation/Gait Assistance: 4: Min guard Ambulation Distance (Feet): 80 Feet Assistive device: Rolling walker Ambulation/Gait Assistance Details: pt neede less cues today, good heel strike Gait Pattern: Step-through pattern;Decreased weight shift to right;Decreased step length  - left;Antalgic Gait velocity: decreased    Exercises Total Joint Exercises Ankle Circles/Pumps: AROM;Both;10 reps Quad Sets: AROM;Both;10 reps Towel Squeeze: AROM;Both;10 reps;Supine Short Arc Quad: AAROM;Right;10 reps;Supine Heel Slides: AAROM;10 reps;Supine;Right Hip ABduction/ADduction: AAROM;Right;10 reps;Supine Straight Leg Raises: AAROM;Right;10 reps;Supine Long Arc Quad: AAROM;Right;10 reps;Seated Knee Flexion: AAROM;Right;Seated;Limitations;10 reps     PT Goals (current goals can now be found in the care plan section) Acute Rehab PT Goals Patient Stated Goal: decr pain  Visit Information  Last PT Received On: 09/10/13 Assistance Needed: +1 History of Present Illness: 52 y.o male admitted to [facility] on 09/06/13 for elective R TKA.  He is WBAT, no knee brace.      Subjective Data  Subjective: PT reports stiffness in his right knee.   Patient Stated Goal: decr pain   Cognition  Cognition Arousal/Alertness: Awake/alert Behavior During Therapy: WFL for tasks assessed/performed Overall Cognitive Status: Within Functional Limits for tasks assessed       End of Session PT - End of Session Equipment Utilized During Treatment: Gait belt Activity Tolerance: Patient limited by pain Patient left: in chair;with call bell/phone within reach Nurse Communication: Mobility status CPM Right Knee CPM Right Knee: Off     Fredrika Canby B. Karene Bracken, PT, DPT [phone]   09/10/2013, 12:06 PM

## 2013-09-10 NOTE — Progress Notes (Signed)
PATIENT ID: Bobby White  MRN: [id number]  DOB/AGE:  Mar 25, 1961 / 52 y.o.  4 Days Post-Op Procedure(s) (LRB): TOTAL KNEE ARTHROPLASTY (Right)    PROGRESS NOTE Subjective: Patient is alert, oriented, no Nausea, no Vomiting, yes passing gas, yes Bowel Movement. Taking PO well. Denies SOB, Chest or Calf Pain. Using Incentive Spirometer, PAS in place. Ambulate WBAT, CPM 0-60 Patient reports pain as mild  .    Objective: Vital signs in last 24 hours: Filed Vitals:   09/09/13 2051 09/09/13 2354 09/10/13 0400 09/10/13 0532  BP: 123/66   122/63  Pulse: 87   89  Temp: 98.5 F (36.9 C)   98.6 F (37 C)  TempSrc:      Resp: 18 16 16 16   Height:      Weight:      SpO2: 95%   92%      Intake/Output from previous day: I/O last 3 completed shifts: In: 480 [P.O.:480] Out: 1300 [Urine:1300]   Intake/Output this shift:     LABORATORY DATA:  Recent Labs  09/08/13 0500 09/09/13 0530  WBC 10.1 9.4  HGB 10.4* 10.6*  HCT 31.6* 30.9*  PLT 189 227    Examination: Neurologically intact Neurovascular intact Sensation intact distally Intact pulses distally Dorsiflexion/Plantar flexion intact Incision: dressing C/D/I No cellulitis present Compartment soft}  Assessment:   4 Days Post-Op Procedure(s) (LRB): TOTAL KNEE ARTHROPLASTY (Right) ADDITIONAL DIAGNOSIS:  Hypertension  Plan: PT/OT WBAT, CPM 5/hrs day until ROM 0-90 degrees, then D/C CPM DVT Prophylaxis:  SCDx72hrs, ASA 325 mg BID x 2 weeks DISCHARGE PLAN: Skilled Nursing Facility/Rehab when pt is cleared by PT and has insurance approval DISCHARGE NEEDS: HHPT, HHRN, CPM and 3-in-1 comode seat     Fabrizio Filip R 09/10/2013, 7:16 AM

## 2013-09-10 NOTE — Progress Notes (Signed)
Clinical social worker assisted with patient discharge to skilled nursing facility, [name].  CSW addressed all family questions and concerns. CSW copied chart and added all important documents. CSW also set up patient transportation with [facility]. Clinical Social Worker will sign off for now as social work intervention is no longer needed.   Bryker Fletchall, MSW, LCSWA [phone] 

## 2013-09-11 LAB — ANAEROBIC CULTURE

## 2013-09-15 ENCOUNTER — Non-Acute Institutional Stay (SKILLED_NURSING_FACILITY): Payer: BC Managed Care – PPO | Admitting: Internal Medicine

## 2013-09-15 DIAGNOSIS — I2510 Atherosclerotic heart disease of native coronary artery without angina pectoris: Secondary | ICD-10-CM

## 2013-09-15 DIAGNOSIS — K219 Gastro-esophageal reflux disease without esophagitis: Secondary | ICD-10-CM

## 2013-09-15 DIAGNOSIS — M1710 Unilateral primary osteoarthritis, unspecified knee: Secondary | ICD-10-CM

## 2013-09-15 DIAGNOSIS — M1711 Unilateral primary osteoarthritis, right knee: Secondary | ICD-10-CM

## 2013-09-15 DIAGNOSIS — I10 Essential (primary) hypertension: Secondary | ICD-10-CM

## 2013-09-22 ENCOUNTER — Non-Acute Institutional Stay (SKILLED_NURSING_FACILITY): Payer: BC Managed Care – PPO | Admitting: Adult Health

## 2013-09-22 DIAGNOSIS — I10 Essential (primary) hypertension: Secondary | ICD-10-CM

## 2013-09-22 DIAGNOSIS — I2510 Atherosclerotic heart disease of native coronary artery without angina pectoris: Secondary | ICD-10-CM

## 2013-09-22 DIAGNOSIS — M1710 Unilateral primary osteoarthritis, unspecified knee: Secondary | ICD-10-CM

## 2013-09-22 DIAGNOSIS — K219 Gastro-esophageal reflux disease without esophagitis: Secondary | ICD-10-CM

## 2013-09-22 DIAGNOSIS — M1711 Unilateral primary osteoarthritis, right knee: Secondary | ICD-10-CM

## 2013-09-22 DIAGNOSIS — E785 Hyperlipidemia, unspecified: Secondary | ICD-10-CM

## 2013-09-22 NOTE — Progress Notes (Signed)
Patient ID: Bobby White, male   DOB: 1961/08/19, 52 y.o.   MRN: [id number]       PROGRESS NOTE  DATE: 09/22/2013   FACILITY: [facility]  LEVEL OF CARE: SNF (31)  Acute Visit  CHIEF COMPLAINT:  Discharge Notes  HISTORY OF PRESENT ILLNESS:This is a 52 year old male who is for discharge home with outpatient care. He has been admitted to [facility] on 09/10/13 from [facility] with Osteoarthritis S/P right total knee arthroplasty. Patient was admitted to this facility for short-term rehabilitation after the patient's recent hospitalization.  Patient has completed SNF rehabilitation and therapy has cleared the patient for discharge.  Reassessment of ongoing problem(s):  GERD: pt's GERD is stable.  Denies ongoing heartburn, abd. Pain, nausea or vomiting.  Currently on a PPI & tolerates it without any adverse reactions.  HTN: Pt 's HTN remains stable.  Denies CP, sob, DOE, pedal edema, headaches, dizziness or visual disturbances.  No complications from the medications currently being used.  Last BP : 134/78  CAD: The angina has been stable. The patient denies dyspnea on exertion, orthopnea, pedal edema, palpitations and paroxysmal nocturnal dyspnea. No complications noted from the medication presently being used.   PAST MEDICAL HISTORY : Reviewed.  No changes.  CURRENT MEDICATIONS: Reviewed per [facility]  REVIEW OF SYSTEMS:  GENERAL: no change in appetite, no fatigue, no weight changes, no fever, chills or weakness RESPIRATORY: no cough, SOB, DOE, wheezing, hemoptysis CARDIAC: no chest pain, edema or palpitations GI: no abdominal pain, diarrhea, constipation, heart burn, nausea or vomiting  PHYSICAL EXAMINATION  VS:  T 98.3       P 86       RR 16      BP 134/78      POX 98 %       WT 266.2 (Lb)  GENERAL: no acute distress, normal body habitus EYES: conjunctivae normal, sclerae normal, normal eye lids NECK: supple, trachea midline, no neck masses, no thyroid  tenderness, no thyromegaly LYMPHATICS: no LAN in the neck, no supraclavicular LAN RESPIRATORY: breathing is even & unlabored, BS CTAB CARDIAC: RRR, no murmur,no extra heart sounds, no edema GI: abdomen soft, normal BS, no masses, no tenderness, no hepatomegaly, no splenomegaly PSYCHIATRIC: the patient is alert & oriented to person, affect & behavior appropriate  LABS/RADIO 09/16/13 WBC 6.0 hemoglobin 9.0 hematocrit 29.9 sodium 138 potassium 4.4 glucose 102 BUN 17 creatinine 0.7 calcium 8.9 04/26/13 cholesterol 135 triglycerides 70 HDL 45 LDL 76 hemoglobin A1c 5.5  ASSESSMENT/PLAN:  Osteoarthritis status post right total knee arthroplasty - for outpatient care  Hypertension - well controlled  Hyperlipidemia - continue Zocor  CAD - stable  GERD - continue Prilosec     I have filled out patient's discharge paperwork and written prescriptions.  Patient will receive outpatient care.  Total discharge time: Less than 30 minutes Discharge time involved coordination of the discharge process with Child psychotherapist, nursing staff and therapy department.   CPT CODE: 40981

## 2013-09-24 ENCOUNTER — Ambulatory Visit: Payer: BC Managed Care – PPO | Admitting: Physical Therapy

## 2013-09-24 DIAGNOSIS — M1710 Unilateral primary osteoarthritis, unspecified knee: Secondary | ICD-10-CM

## 2013-09-24 DIAGNOSIS — R269 Unspecified abnormalities of gait and mobility: Secondary | ICD-10-CM

## 2013-09-24 DIAGNOSIS — M25569 Pain in unspecified knee: Secondary | ICD-10-CM

## 2013-09-24 DIAGNOSIS — R609 Edema, unspecified: Secondary | ICD-10-CM

## 2013-09-24 DIAGNOSIS — M6281 Muscle weakness (generalized): Secondary | ICD-10-CM

## 2013-09-24 DIAGNOSIS — M25669 Stiffness of unspecified knee, not elsewhere classified: Secondary | ICD-10-CM

## 2013-09-27 ENCOUNTER — Encounter: Payer: BC Managed Care – PPO | Admitting: Physical Therapy

## 2013-09-27 ENCOUNTER — Other Ambulatory Visit: Payer: Self-pay | Admitting: Family Medicine

## 2013-09-27 DIAGNOSIS — M25569 Pain in unspecified knee: Secondary | ICD-10-CM

## 2013-09-27 DIAGNOSIS — M25669 Stiffness of unspecified knee, not elsewhere classified: Secondary | ICD-10-CM

## 2013-09-27 DIAGNOSIS — M6281 Muscle weakness (generalized): Secondary | ICD-10-CM

## 2013-09-27 DIAGNOSIS — R609 Edema, unspecified: Secondary | ICD-10-CM

## 2013-09-27 DIAGNOSIS — R269 Unspecified abnormalities of gait and mobility: Secondary | ICD-10-CM

## 2013-09-27 DIAGNOSIS — M1710 Unilateral primary osteoarthritis, unspecified knee: Secondary | ICD-10-CM

## 2013-09-29 ENCOUNTER — Encounter: Payer: BC Managed Care – PPO | Admitting: Physical Therapy

## 2013-09-29 DIAGNOSIS — M25569 Pain in unspecified knee: Secondary | ICD-10-CM

## 2013-09-29 DIAGNOSIS — R609 Edema, unspecified: Secondary | ICD-10-CM

## 2013-09-29 DIAGNOSIS — M6281 Muscle weakness (generalized): Secondary | ICD-10-CM

## 2013-09-29 DIAGNOSIS — R269 Unspecified abnormalities of gait and mobility: Secondary | ICD-10-CM

## 2013-09-29 DIAGNOSIS — M1710 Unilateral primary osteoarthritis, unspecified knee: Secondary | ICD-10-CM

## 2013-09-29 DIAGNOSIS — M25669 Stiffness of unspecified knee, not elsewhere classified: Secondary | ICD-10-CM

## 2013-10-01 ENCOUNTER — Encounter: Payer: BC Managed Care – PPO | Admitting: Physical Therapy

## 2013-10-01 DIAGNOSIS — M1710 Unilateral primary osteoarthritis, unspecified knee: Secondary | ICD-10-CM

## 2013-10-01 DIAGNOSIS — R609 Edema, unspecified: Secondary | ICD-10-CM

## 2013-10-01 DIAGNOSIS — M6281 Muscle weakness (generalized): Secondary | ICD-10-CM

## 2013-10-01 DIAGNOSIS — R269 Unspecified abnormalities of gait and mobility: Secondary | ICD-10-CM

## 2013-10-01 DIAGNOSIS — M25569 Pain in unspecified knee: Secondary | ICD-10-CM

## 2013-10-01 DIAGNOSIS — M25669 Stiffness of unspecified knee, not elsewhere classified: Secondary | ICD-10-CM

## 2013-10-04 ENCOUNTER — Encounter: Payer: BC Managed Care – PPO | Admitting: Physical Therapy

## 2013-10-04 DIAGNOSIS — R609 Edema, unspecified: Secondary | ICD-10-CM

## 2013-10-04 DIAGNOSIS — R269 Unspecified abnormalities of gait and mobility: Secondary | ICD-10-CM

## 2013-10-04 DIAGNOSIS — M25669 Stiffness of unspecified knee, not elsewhere classified: Secondary | ICD-10-CM

## 2013-10-04 DIAGNOSIS — M6281 Muscle weakness (generalized): Secondary | ICD-10-CM

## 2013-10-04 DIAGNOSIS — M25569 Pain in unspecified knee: Secondary | ICD-10-CM

## 2013-10-04 DIAGNOSIS — M1710 Unilateral primary osteoarthritis, unspecified knee: Secondary | ICD-10-CM

## 2013-10-06 ENCOUNTER — Encounter: Payer: BC Managed Care – PPO | Admitting: Physical Therapy

## 2013-10-06 DIAGNOSIS — M25569 Pain in unspecified knee: Secondary | ICD-10-CM

## 2013-10-06 DIAGNOSIS — R609 Edema, unspecified: Secondary | ICD-10-CM

## 2013-10-06 DIAGNOSIS — M1710 Unilateral primary osteoarthritis, unspecified knee: Secondary | ICD-10-CM

## 2013-10-06 DIAGNOSIS — R269 Unspecified abnormalities of gait and mobility: Secondary | ICD-10-CM

## 2013-10-06 DIAGNOSIS — M6281 Muscle weakness (generalized): Secondary | ICD-10-CM

## 2013-10-06 DIAGNOSIS — M25669 Stiffness of unspecified knee, not elsewhere classified: Secondary | ICD-10-CM

## 2013-10-08 ENCOUNTER — Encounter: Payer: BC Managed Care – PPO | Admitting: Physical Therapy

## 2013-10-08 DIAGNOSIS — M25669 Stiffness of unspecified knee, not elsewhere classified: Secondary | ICD-10-CM

## 2013-10-08 DIAGNOSIS — R609 Edema, unspecified: Secondary | ICD-10-CM

## 2013-10-08 DIAGNOSIS — M25569 Pain in unspecified knee: Secondary | ICD-10-CM

## 2013-10-08 DIAGNOSIS — M1710 Unilateral primary osteoarthritis, unspecified knee: Secondary | ICD-10-CM

## 2013-10-08 DIAGNOSIS — M6281 Muscle weakness (generalized): Secondary | ICD-10-CM

## 2013-10-08 DIAGNOSIS — R269 Unspecified abnormalities of gait and mobility: Secondary | ICD-10-CM

## 2013-10-11 ENCOUNTER — Encounter: Payer: BC Managed Care – PPO | Admitting: Physical Therapy

## 2013-10-11 DIAGNOSIS — M1710 Unilateral primary osteoarthritis, unspecified knee: Secondary | ICD-10-CM

## 2013-10-11 DIAGNOSIS — M25569 Pain in unspecified knee: Secondary | ICD-10-CM

## 2013-10-11 DIAGNOSIS — M6281 Muscle weakness (generalized): Secondary | ICD-10-CM

## 2013-10-11 DIAGNOSIS — R609 Edema, unspecified: Secondary | ICD-10-CM

## 2013-10-11 DIAGNOSIS — M25669 Stiffness of unspecified knee, not elsewhere classified: Secondary | ICD-10-CM

## 2013-10-11 DIAGNOSIS — R269 Unspecified abnormalities of gait and mobility: Secondary | ICD-10-CM

## 2013-10-12 NOTE — Progress Notes (Signed)
Patient ID: Bobby White, male   DOB: 1961-05-24, 52 y.o.   MRN: [id number]        HISTORY & PHYSICAL  DATE: 09/15/2013   FACILITY: [name]  LEVEL OF CARE: SNF (31)  ALLERGIES:  Allergies  Allergen Reactions  . Ace Inhibitors     REACTION: cough    CHIEF COMPLAINT:  Manage right knee osteoarthritis, hypertension, and CAD.    HISTORY OF PRESENT ILLNESS:  The patient is a 52 year-old, Caucasian male.    KNEE OSTEOARTHRITIS: Patient had a history of pain and functional disability in the knee due to end-stage osteoarthritis and has failed nonsurgical conservative treatments. Patient had worsening of pain with activity and weight bearing, pain that interfered with activities of daily living & pain with passive range of motion. Therefore patient underwent total knee arthroplasty and tolerated the procedure well. Patient is admitted to this facility for sort short-term rehabilitation. Patient denies knee pain.    HTN: Pt 's HTN remains stable.  Denies CP, sob, DOE, pedal edema, headaches, dizziness or visual disturbances.  No complications from the medications currently being used.  Last BP :  128/75.    CAD: The angina has been stable. The patient denies dyspnea on exertion, orthopnea, pedal edema, palpitations and paroxysmal nocturnal dyspnea. No complications noted from the medication presently being used.    PAST MEDICAL HISTORY :  Past Medical History  Diagnosis Date  . Obesity   . Impaired fasting glucose 2009  . Varicose vein   . Hypertension 1/10  . GERD (gastroesophageal reflux disease)   . Arthritis   . Hepatitis     Hep C --had to get shots  . Sleep apnea   . Coronary artery disease     PAST SURGICAL HISTORY: Past Surgical History  Procedure Laterality Date  . Pins in right hand  1990's    4th and 5th digits-- removed 6 weeks afterwards  . 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 Cardiac catheterization      2012  . Total knee  arthroplasty Right 09/06/2013    Dr Turner Daniels  . Total knee arthroplasty Right 09/06/2013    Procedure: TOTAL KNEE ARTHROPLASTY;  Surgeon: Nestor Lewandowsky, MD;  Location: [facility] OR;  Service: Orthopedics;  Laterality: Right;    SOCIAL HISTORY:  reports that he quit smoking about 16 years ago. His smoking use included Cigarettes. He has a 22.5 pack-year smoking history. He has never used smokeless tobacco. He reports that he drinks alcohol. He reports that he uses illicit drugs (Marijuana).  FAMILY HISTORY:  Family History  Problem Relation Age of Onset  . Diabetes Mother   . Cancer Father     pancreatic    CURRENT MEDICATIONS: Reviewed per [facility]  REVIEW OF SYSTEMS:  See HPI otherwise 14 point ROS is negative.  PHYSICAL EXAMINATION  VS:  T 98.5       P 83      RR 18      BP 128/75      POX 98%        WT (Lb)  GENERAL: no acute distress, moderately obese body habitus EYES: conjunctivae normal, sclerae normal, normal eye lids MOUTH/THROAT: lips without lesions,no lesions in the mouth,tongue is without lesions,uvula elevates in midline NECK: supple, trachea midline, no neck masses, no thyroid tenderness, no thyromegaly LYMPHATICS: no LAN in the neck, no supraclavicular LAN RESPIRATORY: breathing is even & unlabored, BS CTAB  CARDIAC: RRR, no murmur,no extra heart sounds, no edema GI:  ABDOMEN: abdomen soft, normal BS, no masses, no tenderness  LIVER/SPLEEN: no hepatomegaly, no splenomegaly MUSCULOSKELETAL: HEAD: normal to inspection & palpation BACK: no kyphosis, scoliosis or spinal processes tenderness EXTREMITIES: LEFT UPPER EXTREMITY: full range of motion, normal strength & tone RIGHT UPPER EXTREMITY:  full range of motion, normal strength & tone LEFT LOWER EXTREMITY:  full range of motion, normal strength & tone RIGHT LOWER EXTREMITY: strength intact, range of motion not tested due to surgery   PSYCHIATRIC: the patient is alert & oriented to person, affect & behavior  appropriate  LABS/RADIOLOGY: Hemoglobin 10.6, otherwise CBC normal.    ASSESSMENT/PLAN:  Right knee osteoarthritis.  Status post right total knee arthroplasty.  Continue rehabilitation.    Hypertension.  Well controlled.    CAD.  Stable.    GERD.  Well controlled.     Check CBC and BMP.    I have reviewed patient's medical records received at admission/from hospitalization.  CPT CODE: 62130

## 2013-10-13 ENCOUNTER — Encounter: Payer: BC Managed Care – PPO | Admitting: Physical Therapy

## 2013-10-13 DIAGNOSIS — M12369 Palindromic rheumatism, unspecified knee: Secondary | ICD-10-CM

## 2013-10-13 DIAGNOSIS — M24873 Other specific joint derangements of unspecified ankle, not elsewhere classified: Secondary | ICD-10-CM

## 2013-10-13 DIAGNOSIS — M24876 Other specific joint derangements of unspecified foot, not elsewhere classified: Secondary | ICD-10-CM

## 2013-10-13 DIAGNOSIS — M25669 Stiffness of unspecified knee, not elsewhere classified: Secondary | ICD-10-CM

## 2013-10-13 DIAGNOSIS — M25569 Pain in unspecified knee: Secondary | ICD-10-CM

## 2013-10-13 DIAGNOSIS — R609 Edema, unspecified: Secondary | ICD-10-CM

## 2013-10-13 DIAGNOSIS — R269 Unspecified abnormalities of gait and mobility: Secondary | ICD-10-CM

## 2013-10-18 ENCOUNTER — Other Ambulatory Visit: Payer: Self-pay | Admitting: Orthopedic Surgery

## 2013-10-18 ENCOUNTER — Encounter: Payer: BC Managed Care – PPO | Admitting: Physical Therapy

## 2013-10-18 DIAGNOSIS — M1710 Unilateral primary osteoarthritis, unspecified knee: Secondary | ICD-10-CM

## 2013-10-18 DIAGNOSIS — M25569 Pain in unspecified knee: Secondary | ICD-10-CM

## 2013-10-18 DIAGNOSIS — R609 Edema, unspecified: Secondary | ICD-10-CM

## 2013-10-18 DIAGNOSIS — M6281 Muscle weakness (generalized): Secondary | ICD-10-CM

## 2013-10-18 DIAGNOSIS — M259 Joint disorder, unspecified: Secondary | ICD-10-CM

## 2013-10-18 DIAGNOSIS — M25669 Stiffness of unspecified knee, not elsewhere classified: Secondary | ICD-10-CM

## 2013-10-20 DIAGNOSIS — M25569 Pain in unspecified knee: Secondary | ICD-10-CM

## 2013-10-20 DIAGNOSIS — M6281 Muscle weakness (generalized): Secondary | ICD-10-CM

## 2013-10-20 DIAGNOSIS — M1710 Unilateral primary osteoarthritis, unspecified knee: Secondary | ICD-10-CM

## 2013-10-20 DIAGNOSIS — M25669 Stiffness of unspecified knee, not elsewhere classified: Secondary | ICD-10-CM

## 2013-10-20 DIAGNOSIS — R609 Edema, unspecified: Secondary | ICD-10-CM

## 2013-10-20 DIAGNOSIS — R269 Unspecified abnormalities of gait and mobility: Secondary | ICD-10-CM

## 2013-10-22 ENCOUNTER — Encounter (HOSPITAL_COMMUNITY): Payer: Self-pay | Admitting: Pharmacy Technician

## 2013-10-22 ENCOUNTER — Encounter: Payer: BC Managed Care – PPO | Admitting: Physical Therapy

## 2013-10-22 DIAGNOSIS — M25569 Pain in unspecified knee: Secondary | ICD-10-CM

## 2013-10-22 DIAGNOSIS — M6281 Muscle weakness (generalized): Secondary | ICD-10-CM

## 2013-10-22 DIAGNOSIS — M1710 Unilateral primary osteoarthritis, unspecified knee: Secondary | ICD-10-CM

## 2013-10-22 DIAGNOSIS — R269 Unspecified abnormalities of gait and mobility: Secondary | ICD-10-CM

## 2013-10-22 DIAGNOSIS — M25669 Stiffness of unspecified knee, not elsewhere classified: Secondary | ICD-10-CM

## 2013-10-22 DIAGNOSIS — R609 Edema, unspecified: Secondary | ICD-10-CM

## 2013-10-25 ENCOUNTER — Encounter: Payer: BC Managed Care – PPO | Admitting: Physical Therapy

## 2013-10-25 DIAGNOSIS — M1710 Unilateral primary osteoarthritis, unspecified knee: Secondary | ICD-10-CM

## 2013-10-25 DIAGNOSIS — R269 Unspecified abnormalities of gait and mobility: Secondary | ICD-10-CM

## 2013-10-25 DIAGNOSIS — R609 Edema, unspecified: Secondary | ICD-10-CM

## 2013-10-25 DIAGNOSIS — M25669 Stiffness of unspecified knee, not elsewhere classified: Secondary | ICD-10-CM

## 2013-10-25 DIAGNOSIS — M6281 Muscle weakness (generalized): Secondary | ICD-10-CM

## 2013-10-25 DIAGNOSIS — M25569 Pain in unspecified knee: Secondary | ICD-10-CM

## 2013-10-26 ENCOUNTER — Encounter (HOSPITAL_COMMUNITY)
Admission: RE | Admit: 2013-10-26 | Discharge: 2013-10-26 | Disposition: A | Discharge status: Home / Self Care | Payer: BC Managed Care – PPO | Source: Ambulatory Visit | Attending: Orthopedic Surgery | Admitting: Orthopedic Surgery

## 2013-10-26 ENCOUNTER — Encounter (HOSPITAL_COMMUNITY): Payer: Self-pay

## 2013-10-26 ENCOUNTER — Encounter: Payer: Self-pay | Admitting: Family Medicine

## 2013-10-26 ENCOUNTER — Ambulatory Visit (INDEPENDENT_AMBULATORY_CARE_PROVIDER_SITE_OTHER): Payer: BC Managed Care – PPO | Admitting: Family Medicine

## 2013-10-26 VITALS — BP 136/77 | HR 82 | Temp 98.2°F | Ht 71.0 in | Wt 275.0 lb

## 2013-10-26 DIAGNOSIS — Z01812 Encounter for preprocedural laboratory examination: Secondary | ICD-10-CM | POA: Insufficient documentation

## 2013-10-26 DIAGNOSIS — E785 Hyperlipidemia, unspecified: Secondary | ICD-10-CM

## 2013-10-26 DIAGNOSIS — Z01818 Encounter for other preprocedural examination: Secondary | ICD-10-CM | POA: Insufficient documentation

## 2013-10-26 DIAGNOSIS — R7301 Impaired fasting glucose: Secondary | ICD-10-CM

## 2013-10-26 DIAGNOSIS — I10 Essential (primary) hypertension: Secondary | ICD-10-CM

## 2013-10-26 LAB — URINALYSIS, ROUTINE W REFLEX MICROSCOPIC
Glucose, UA: NEGATIVE mg/dL
Ketones, ur: NEGATIVE mg/dL
Leukocytes, UA: NEGATIVE
Protein, ur: NEGATIVE mg/dL
pH: 6.5 (ref 5.0–8.0)

## 2013-10-26 LAB — CBC WITH DIFFERENTIAL/PLATELET
Basophils Relative: 1 % (ref 0–1)
Eosinophils Absolute: 0.2 10*3/uL (ref 0.0–0.7)
HCT: 37 % — ABNORMAL LOW (ref 39.0–52.0)
Hemoglobin: 11.6 g/dL — ABNORMAL LOW (ref 13.0–17.0)
MCH: 25.3 pg — ABNORMAL LOW (ref 26.0–34.0)
MCHC: 31.4 g/dL (ref 30.0–36.0)
Monocytes Absolute: 0.4 10*3/uL (ref 0.1–1.0)
Monocytes Relative: 7 % (ref 3–12)
Platelets: 211 10*3/uL (ref 150–400)

## 2013-10-26 LAB — BASIC METABOLIC PANEL
BUN: 17 mg/dL (ref 6–23)
Calcium: 8.9 mg/dL (ref 8.4–10.5)
Chloride: 108 mEq/L (ref 96–112)
Creatinine, Ser: 0.78 mg/dL (ref 0.50–1.35)
GFR calc Af Amer: >90 mL/min (ref >90)
Glucose, Bld: 93 mg/dL (ref 70–99)
Potassium: 4.5 mEq/L (ref 3.5–5.1)

## 2013-10-26 LAB — URINE MICROSCOPIC-ADD ON

## 2013-10-26 LAB — PROTIME-INR: INR: 0.95 (ref 0.00–1.49)

## 2013-10-26 LAB — SURGICAL PCR SCREEN: MRSA, PCR: NEGATIVE

## 2013-10-26 LAB — APTT: aPTT: 31 seconds (ref 24–37)

## 2013-10-26 LAB — TYPE AND SCREEN: Antibody Screen: NEGATIVE

## 2013-10-26 NOTE — Pre-Procedure Instructions (Signed)
ERIKSON DANZY  10/26/2013   Your procedure is scheduled on:  Monday December 15 th at 0730 AM  Report to [facility] Short Stay Main Entrance "A" at 0530 AM.  Call this number if you have problems the morning of surgery: [phone]   Remember:   Do not eat food or drink liquids after midnight Sunday 10/31/13.   Take these medicines the morning of surgery with A SIP OF WATER: amlodipine( NORVASC),  metoprolol (TOPROL_XL), omeprazole ( PRILOSEC), and Oxycodone if needed for pain.                                            Stop Aspirin, Vitamins, Herbal medication and Nsaids as of today.    Do not wear lotions, powders, or perfumes. You may wear deodorant.  Do not shave 48 hours prior to surgery. Men may shave face and neck.  Do not bring valuables to the hospital.  [facility] is not responsible for any belongings or valuables.  Do not wear any jewelry             Contacts, dentures or bridgework may not be worn into surgery.  Leave suitcase in the car. After surgery it may be brought to your room.  For patients admitted to the hospital, discharge time is determined by treatment team.               Patients discharged the day of surgery will not be allowed to drive home.    Special Instructions: Incentive Spirometry - Practice and bring it with you on the day of surgery. Shower using CHG 2 nights before surgery and the night before surgery.  If you shower the day of surgery use CHG.  Use special wash - you have one bottle of CHG for all showers.  You should use approximately 1/3 of the bottle for each shower.   Please read over the following fact sheets that you were given: Pain Booklet, Coughing and Deep Breathing, Blood Transfusion Information, MRSA Information and Surgical Site Infection Prevention

## 2013-10-26 NOTE — Progress Notes (Signed)
   Subjective:    Patient ID: Bobby White, male    DOB: 17-Oct-1961, 52 y.o.   MRN: [id number]  HPI  HTN- WEll controlled at goal. F/U in 6 mo.  Pt denies chest pain, SOB, dizziness, or heart palpitations.  Taking meds as directed w/o problems.  Denies medication side effects.   Hyperlipidemia - wil recheck liver and lipids. Doing well on simvastatin.  No myalgias or side effects from medication.  IFG - Will check A1C. Will call with results. No hypoglycemics.  He has been doing a fantastic job with weight loss. He's been working with Dr. Seymour Bars.  Seeing Dr. Cathey Endow for weight loss and doing really well.  He is getting the left knee replacement on Monday and will do PT so not actively exerciseing.    Review of Systems     Objective:   Physical Exam  Constitutional: He is oriented to person, place, and time. He appears well-developed and well-nourished.  HENT:  Head: Normocephalic and atraumatic.  Cardiovascular: Normal rate, regular rhythm and normal heart sounds.   Pulmonary/Chest: Effort normal and breath sounds normal.  Neurological: He is alert and oriented to person, place, and time.  Skin: Skin is warm and dry.  Psychiatric: He has a normal mood and affect. His behavior is normal.          Assessment & Plan:  HTN- WEll controlled at goal. F/U in 6 mo.   Hyperlipidemia - wil recheck liver and lipids. CMP and lipid panel. ALT in 6 months.  IFG - Will check A1C. A1c looks fantastic today at 5.0. Repeat in one year.  I wished him luck on his knee surgery on Monday.

## 2013-10-27 ENCOUNTER — Encounter (INDEPENDENT_AMBULATORY_CARE_PROVIDER_SITE_OTHER): Payer: BC Managed Care – PPO | Admitting: Physical Therapy

## 2013-10-27 DIAGNOSIS — M6281 Muscle weakness (generalized): Secondary | ICD-10-CM

## 2013-10-27 DIAGNOSIS — R609 Edema, unspecified: Secondary | ICD-10-CM

## 2013-10-27 DIAGNOSIS — M25569 Pain in unspecified knee: Secondary | ICD-10-CM

## 2013-10-27 DIAGNOSIS — R229 Localized swelling, mass and lump, unspecified: Secondary | ICD-10-CM

## 2013-10-27 DIAGNOSIS — M25669 Stiffness of unspecified knee, not elsewhere classified: Secondary | ICD-10-CM

## 2013-10-27 DIAGNOSIS — M1710 Unilateral primary osteoarthritis, unspecified knee: Secondary | ICD-10-CM

## 2013-10-29 ENCOUNTER — Encounter: Payer: BC Managed Care – PPO | Admitting: Physical Therapy

## 2013-10-29 DIAGNOSIS — M1710 Unilateral primary osteoarthritis, unspecified knee: Secondary | ICD-10-CM

## 2013-10-29 DIAGNOSIS — R609 Edema, unspecified: Secondary | ICD-10-CM

## 2013-10-29 DIAGNOSIS — M25569 Pain in unspecified knee: Secondary | ICD-10-CM

## 2013-10-29 DIAGNOSIS — M25669 Stiffness of unspecified knee, not elsewhere classified: Secondary | ICD-10-CM

## 2013-10-29 DIAGNOSIS — M6281 Muscle weakness (generalized): Secondary | ICD-10-CM

## 2013-10-29 DIAGNOSIS — R229 Localized swelling, mass and lump, unspecified: Secondary | ICD-10-CM

## 2013-10-29 NOTE — H&P (Signed)
TOTAL KNEE ADMISSION H&P  Patient is being admitted for left total knee arthroplasty.  Subjective:  Chief Complaint:left knee pain.  HPI: Bobby White, 52 y.o. male, has a history of pain and functional disability in the left knee due to arthritis and has failed non-surgical conservative treatments for greater than 12 weeks to includeNSAID's and/or analgesics, corticosteriod injections and activity modification.  Onset of symptoms was gradual, starting several years ago with gradually worsening course since that time. The patient noted no past surgery on the left knee(s).  Patient currently rates pain in the left knee(s) at 10 out of 10 with activity. Patient has worsening of pain with activity and weight bearing, pain that interferes with activities of daily living, pain with passive range of motion, crepitus and joint swelling.  Patient has evidence of joint space narrowing by imaging studies. There is no active infection.  Patient Active Problem List   Diagnosis Date Noted  . Arthritis of knee, right 09/08/2013  . Obstructive sleep apnea 07/14/2013  . Left Achilles tendonitis 04/23/2013  . Low back pain 04/23/2013  . Osteoarthritis of both knees 03/19/2013  . Coronary atherosclerosis 09/06/2011  . Essential hypertension 09/06/2011  . 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  . Status post coronary artery bypass grafting 08/27/2011  . Coronary artery disease 07/08/2011  . HYPERLIPIDEMIA 01/10/2011  . OBESITY, UNSPECIFIED 01/09/2011  . WEIGHT GAIN 01/09/2011  . BACK STRAIN 10/02/2010  . ADJUSTMENT DISORDER WITH DEPRESSED MOOD 03/31/2009  . HYPERTENSION, BENIGN ESSENTIAL 11/21/2008  . VARICOSE VEINS LOWER EXTREMITIES W/OTH COMPS 08/31/2008  . IMPAIRED FASTING GLUCOSE 08/31/2008  . COLONIC POLYPS 04/20/2008  . GERD 03/16/2008   Past Medical History  Diagnosis Date  . Obesity   . Impaired fasting glucose 2009  . Varicose vein   . Hypertension 1/10  . GERD  (gastroesophageal reflux disease)   . Arthritis   . Hepatitis     Hep C --had to get shots  . Sleep apnea   . Coronary artery disease     Past Surgical History  Procedure Laterality Date  . Pins in right hand  1990's    4th and 5th digits-- removed 6 weeks afterwards  . 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 Cardiac catheterization      2012  . Total knee arthroplasty Right 09/06/2013    Dr Turner Daniels  . Total knee arthroplasty Right 09/06/2013    Procedure: TOTAL KNEE ARTHROPLASTY;  Surgeon: Nestor Lewandowsky, MD;  Location: [facility] OR;  Service: Orthopedics;  Laterality: Right;    No prescriptions prior to admission   Allergies  Allergen Reactions  . Ace Inhibitors     REACTION: cough    History  Substance Use Topics  . Smoking status: Former Smoker -- 1.50 packs/day for 15 years    Types: Cigarettes    Quit date: 11/18/1996  . Smokeless tobacco: Never Used  . Alcohol Use: Yes     Comment: socially    Family History  Problem Relation Age of Onset  . Diabetes Mother   . Cancer Father     pancreatic     Review of Systems  Constitutional: Negative.   HENT: Negative.   Eyes: Negative.   Respiratory: Negative.   Cardiovascular: Negative.   Gastrointestinal: Negative.   Genitourinary: Negative.   Musculoskeletal: Positive for joint pain.  Skin: Negative.   Neurological: Negative.   Endo/Heme/Allergies: Negative.   Psychiatric/Behavioral: Negative.     Objective:  Physical Exam  Constitutional: He is oriented to person, place, and time. He appears well-developed and well-nourished.  HENT:  Head: Normocephalic and atraumatic.  Eyes: Pupils are equal, round, and reactive to light.  Neck: Normal range of motion. Neck supple.  Cardiovascular: Intact distal pulses.   Respiratory: Effort normal.  Musculoskeletal: He exhibits tenderness.  Neurological: He is alert and oriented to person, place, and time.  Skin: Skin is warm and dry.  Psychiatric: He has a  normal mood and affect. His behavior is normal. Judgment and thought content normal.    Vital signs in last 24 hours:    Labs:   Estimated body mass index is 38.15 kg/(m^2) as calculated from the following:   Height as of 09/08/13: 5\' 11"  (1.803 m).   Weight as of 08/31/13: 124.03 kg (273 lb 7 oz).   Imaging Review Radiographs:  X-rays were ordered, performed, and interpreted by myself and Dr. Turner Daniels today included; bilateral AP weightbearing, Zoila Shutter lateral sunrise views are taken and reviewed in office today.  No fracture dislocation subluxation or tumors identified.  Patient does have obvious near bone-on-bone arthritis in both the right and left knee medial compartment.  He does have mild spurring on the medial femoral plateau and along the patella.  Assessment/Plan:  End stage arthritis, left knee   The patient history, physical examination, clinical judgment of the provider and imaging studies are consistent with end stage degenerative joint disease of the left knee(s) and total knee arthroplasty is deemed medically necessary. The treatment options including medical management, injection therapy arthroscopy and arthroplasty were discussed at length. The risks and benefits of total knee arthroplasty were presented and reviewed. The risks due to aseptic loosening, infection, stiffness, patella tracking problems, thromboembolic complications and other imponderables were discussed. The patient acknowledged the explanation, agreed to proceed with the plan and consent was signed. Patient is being admitted for inpatient treatment for surgery, pain control, PT, OT, prophylactic antibiotics, VTE prophylaxis, progressive ambulation and ADL's and discharge planning. The patient is planning to be discharged home with home health services

## 2013-10-30 ENCOUNTER — Other Ambulatory Visit: Payer: Self-pay | Admitting: Family Medicine

## 2013-10-31 MED ORDER — DEXTROSE 5 % IV SOLN
3.0000 g | INTRAVENOUS | Status: AC
  Administered 2013-11-01: 3 g via INTRAVENOUS
  Filled 2013-10-31: qty 3000

## 2013-11-01 ENCOUNTER — Inpatient Hospital Stay (HOSPITAL_COMMUNITY): Payer: BC Managed Care – PPO

## 2013-11-01 ENCOUNTER — Encounter (HOSPITAL_COMMUNITY): Payer: Self-pay | Admitting: Surgery

## 2013-11-01 ENCOUNTER — Inpatient Hospital Stay (HOSPITAL_COMMUNITY): Payer: BC Managed Care – PPO | Admitting: Anesthesiology

## 2013-11-01 ENCOUNTER — Encounter (HOSPITAL_COMMUNITY): Payer: BC Managed Care – PPO | Admitting: Anesthesiology

## 2013-11-01 ENCOUNTER — Encounter (HOSPITAL_COMMUNITY)
Admission: RE | Disposition: A | Discharge status: Home / Self Care | Payer: Self-pay | Source: Ambulatory Visit | Attending: Orthopedic Surgery

## 2013-11-01 ENCOUNTER — Inpatient Hospital Stay (HOSPITAL_COMMUNITY)
Admission: RE | Admit: 2013-11-01 | Discharge: 2013-11-04 | DRG: 470 | Disposition: A | Discharge status: Home / Self Care | Payer: BC Managed Care – PPO | Source: Ambulatory Visit | Attending: Orthopedic Surgery | Admitting: Orthopedic Surgery

## 2013-11-01 DIAGNOSIS — M1710 Unilateral primary osteoarthritis, unspecified knee: Principal | ICD-10-CM | POA: Diagnosis present

## 2013-11-01 DIAGNOSIS — I2510 Atherosclerotic heart disease of native coronary artery without angina pectoris: Secondary | ICD-10-CM | POA: Diagnosis present

## 2013-11-01 DIAGNOSIS — E785 Hyperlipidemia, unspecified: Secondary | ICD-10-CM | POA: Diagnosis present

## 2013-11-01 DIAGNOSIS — M1712 Unilateral primary osteoarthritis, left knee: Secondary | ICD-10-CM

## 2013-11-01 DIAGNOSIS — K219 Gastro-esophageal reflux disease without esophagitis: Secondary | ICD-10-CM | POA: Diagnosis present

## 2013-11-01 DIAGNOSIS — I10 Essential (primary) hypertension: Secondary | ICD-10-CM | POA: Diagnosis present

## 2013-11-01 DIAGNOSIS — E669 Obesity, unspecified: Secondary | ICD-10-CM | POA: Diagnosis present

## 2013-11-01 DIAGNOSIS — G4733 Obstructive sleep apnea (adult) (pediatric): Secondary | ICD-10-CM | POA: Diagnosis present

## 2013-11-01 DIAGNOSIS — Z951 Presence of aortocoronary bypass graft: Secondary | ICD-10-CM

## 2013-11-01 DIAGNOSIS — Z87891 Personal history of nicotine dependence: Secondary | ICD-10-CM

## 2013-11-01 HISTORY — PX: TOTAL KNEE ARTHROPLASTY: SHX125

## 2013-11-01 SURGERY — ARTHROPLASTY, KNEE, TOTAL
Anesthesia: General | Site: Knee | Laterality: Left

## 2013-11-01 MED ORDER — PHENOL 1.4 % MT LIQD: 1.0000 | OROMUCOSAL | Status: DC | PRN

## 2013-11-01 MED ORDER — BISACODYL 5 MG PO TBEC: 5.0000 mg | DELAYED_RELEASE_TABLET | Freq: Every day | ORAL | Status: DC | PRN

## 2013-11-01 MED ORDER — PROPOFOL 10 MG/ML IV BOLUS
INTRAVENOUS | Status: DC | PRN
  Administered 2013-11-01: 350 mg via INTRAVENOUS

## 2013-11-01 MED ORDER — HYDROMORPHONE HCL PF 1 MG/ML IJ SOLN
INTRAMUSCULAR | Status: AC
  Filled 2013-11-01: qty 1

## 2013-11-01 MED ORDER — ACETAMINOPHEN 325 MG PO TABS: 650.0000 mg | ORAL_TABLET | Freq: Four times a day (QID) | ORAL | Status: DC | PRN

## 2013-11-01 MED ORDER — ROCURONIUM BROMIDE 100 MG/10ML IV SOLN
INTRAVENOUS | Status: DC | PRN
  Administered 2013-11-01: 50 mg via INTRAVENOUS

## 2013-11-01 MED ORDER — METHOCARBAMOL 500 MG PO TABS: 500.0000 mg | ORAL_TABLET | Freq: Two times a day (BID) | ORAL | Status: DC

## 2013-11-01 MED ORDER — HYDROMORPHONE HCL PF 1 MG/ML IJ SOLN
0.2500 mg | INTRAMUSCULAR | Status: AC | PRN
  Administered 2013-11-01: 1 mg via INTRAVENOUS
  Administered 2013-11-01 (×7): 0.5 mg via INTRAVENOUS

## 2013-11-01 MED ORDER — ONDANSETRON HCL 4 MG/2ML IJ SOLN
INTRAMUSCULAR | Status: DC | PRN
  Administered 2013-11-01: 4 mg via INTRAVENOUS

## 2013-11-01 MED ORDER — ONDANSETRON HCL 4 MG/2ML IJ SOLN: 4.0000 mg | Freq: Once | INTRAMUSCULAR | Status: DC | PRN

## 2013-11-01 MED ORDER — MAGNESIUM CITRATE PO SOLN: 1.0000 | Freq: Once | ORAL | Status: AC | PRN

## 2013-11-01 MED ORDER — TOPIRAMATE 25 MG PO TABS
50.0000 mg | ORAL_TABLET | Freq: Two times a day (BID) | ORAL | Status: DC
  Administered 2013-11-01 – 2013-11-04 (×6): 50 mg via ORAL
  Filled 2013-11-01 (×8): qty 2

## 2013-11-01 MED ORDER — CHLORHEXIDINE GLUCONATE 4 % EX LIQD: 60.0000 mL | Freq: Once | CUTANEOUS | Status: DC

## 2013-11-01 MED ORDER — DOCUSATE SODIUM 100 MG PO CAPS
100.0000 mg | ORAL_CAPSULE | Freq: Two times a day (BID) | ORAL | Status: DC
  Administered 2013-11-01 – 2013-11-04 (×5): 100 mg via ORAL
  Filled 2013-11-01 (×6): qty 1

## 2013-11-01 MED ORDER — METHOCARBAMOL 100 MG/ML IJ SOLN
500.0000 mg | Freq: Four times a day (QID) | INTRAMUSCULAR | Status: DC | PRN
  Filled 2013-11-01: qty 5

## 2013-11-01 MED ORDER — DEXTROSE-NACL 5-0.45 % IV SOLN: INTRAVENOUS | Status: DC

## 2013-11-01 MED ORDER — NITROGLYCERIN 0.4 MG SL SUBL: 0.4000 mg | SUBLINGUAL_TABLET | SUBLINGUAL | Status: DC | PRN

## 2013-11-01 MED ORDER — METOPROLOL SUCCINATE ER 25 MG PO TB24
25.0000 mg | ORAL_TABLET | Freq: Once | ORAL | Status: AC
  Administered 2013-11-01: 25 mg via ORAL
  Filled 2013-11-01: qty 1

## 2013-11-01 MED ORDER — METOPROLOL SUCCINATE ER 25 MG PO TB24
25.0000 mg | ORAL_TABLET | Freq: Every day | ORAL | Status: DC
  Administered 2013-11-02 – 2013-11-04 (×3): 25 mg via ORAL
  Filled 2013-11-01 (×3): qty 1

## 2013-11-01 MED ORDER — SODIUM CHLORIDE 0.9 % IR SOLN
Status: DC | PRN
  Administered 2013-11-01: 1000 mL

## 2013-11-01 MED ORDER — METOCLOPRAMIDE HCL 10 MG PO TABS
5.0000 mg | ORAL_TABLET | Freq: Three times a day (TID) | ORAL | Status: DC | PRN
  Administered 2013-11-03 – 2013-11-04 (×2): 10 mg via ORAL
  Filled 2013-11-01 (×2): qty 1

## 2013-11-01 MED ORDER — OXYCODONE HCL 5 MG PO TABS
5.0000 mg | ORAL_TABLET | ORAL | Status: DC | PRN
  Administered 2013-11-01 – 2013-11-04 (×17): 10 mg via ORAL
  Filled 2013-11-01 (×18): qty 2

## 2013-11-01 MED ORDER — HYDROMORPHONE HCL PF 1 MG/ML IJ SOLN
1.0000 mg | INTRAMUSCULAR | Status: DC | PRN
  Administered 2013-11-01 – 2013-11-02 (×8): 1 mg via INTRAVENOUS
  Filled 2013-11-01 (×8): qty 1

## 2013-11-01 MED ORDER — SODIUM CHLORIDE 0.9 % IJ SOLN
INTRAMUSCULAR | Status: DC | PRN
  Administered 2013-11-01: 08:00:00

## 2013-11-01 MED ORDER — HYDROMORPHONE HCL PF 1 MG/ML IJ SOLN
INTRAMUSCULAR | Status: AC
  Administered 2013-11-01: 0.5 mg
  Filled 2013-11-01: qty 1

## 2013-11-01 MED ORDER — NEOSTIGMINE METHYLSULFATE 1 MG/ML IJ SOLN
INTRAMUSCULAR | Status: DC | PRN
  Administered 2013-11-01: 4 mg via INTRAVENOUS

## 2013-11-01 MED ORDER — FENTANYL CITRATE 0.05 MG/ML IJ SOLN
INTRAMUSCULAR | Status: DC | PRN
  Administered 2013-11-01 (×2): 100 ug via INTRAVENOUS
  Administered 2013-11-01 (×4): 50 ug via INTRAVENOUS
  Administered 2013-11-01 (×2): 100 ug via INTRAVENOUS

## 2013-11-01 MED ORDER — METHOCARBAMOL 500 MG PO TABS
ORAL_TABLET | ORAL | Status: AC
  Filled 2013-11-01: qty 1

## 2013-11-01 MED ORDER — SENNOSIDES-DOCUSATE SODIUM 8.6-50 MG PO TABS: 1.0000 | ORAL_TABLET | Freq: Every evening | ORAL | Status: DC | PRN

## 2013-11-01 MED ORDER — ALBUTEROL SULFATE HFA 108 (90 BASE) MCG/ACT IN AERS
2.0000 | INHALATION_SPRAY | Freq: Four times a day (QID) | RESPIRATORY_TRACT | Status: DC | PRN
  Filled 2013-11-01: qty 6.7

## 2013-11-01 MED ORDER — OXYCODONE HCL 5 MG PO TABS: 5.0000 mg | ORAL_TABLET | Freq: Four times a day (QID) | ORAL | Status: DC | PRN

## 2013-11-01 MED ORDER — METHOCARBAMOL 500 MG PO TABS
500.0000 mg | ORAL_TABLET | Freq: Four times a day (QID) | ORAL | Status: DC | PRN
  Administered 2013-11-01 – 2013-11-04 (×10): 500 mg via ORAL
  Filled 2013-11-01 (×9): qty 1

## 2013-11-01 MED ORDER — KCL IN DEXTROSE-NACL 20-5-0.45 MEQ/L-%-% IV SOLN
INTRAVENOUS | Status: DC
  Administered 2013-11-01 – 2013-11-02 (×3): via INTRAVENOUS
  Filled 2013-11-01 (×11): qty 1000

## 2013-11-01 MED ORDER — PANTOPRAZOLE SODIUM 40 MG PO TBEC
40.0000 mg | DELAYED_RELEASE_TABLET | Freq: Every day | ORAL | Status: DC
  Administered 2013-11-01 – 2013-11-04 (×4): 40 mg via ORAL
  Filled 2013-11-01 (×4): qty 1

## 2013-11-01 MED ORDER — CEFUROXIME SODIUM 1.5 G IJ SOLR
INTRAMUSCULAR | Status: AC
  Filled 2013-11-01: qty 1.5

## 2013-11-01 MED ORDER — ASPIRIN EC 325 MG PO TBEC: 325.0000 mg | DELAYED_RELEASE_TABLET | Freq: Two times a day (BID) | ORAL | Status: DC

## 2013-11-01 MED ORDER — MENTHOL 3 MG MT LOZG: 1.0000 | LOZENGE | OROMUCOSAL | Status: DC | PRN

## 2013-11-01 MED ORDER — AMLODIPINE BESYLATE 5 MG PO TABS
5.0000 mg | ORAL_TABLET | Freq: Every day | ORAL | Status: DC
  Administered 2013-11-01 – 2013-11-04 (×4): 5 mg via ORAL
  Filled 2013-11-01 (×5): qty 1

## 2013-11-01 MED ORDER — 0.9 % SODIUM CHLORIDE (POUR BTL) OPTIME
TOPICAL | Status: DC | PRN
  Administered 2013-11-01: 1000 mL

## 2013-11-01 MED ORDER — ASPIRIN EC 325 MG PO TBEC
325.0000 mg | DELAYED_RELEASE_TABLET | Freq: Every day | ORAL | Status: DC
  Administered 2013-11-02 – 2013-11-04 (×3): 325 mg via ORAL
  Filled 2013-11-01 (×4): qty 1

## 2013-11-01 MED ORDER — CEFUROXIME SODIUM 1.5 G IJ SOLR
INTRAMUSCULAR | Status: DC | PRN
  Administered 2013-11-01: 1.5 g

## 2013-11-01 MED ORDER — ONDANSETRON HCL 4 MG PO TABS: 4.0000 mg | ORAL_TABLET | Freq: Four times a day (QID) | ORAL | Status: DC | PRN

## 2013-11-01 MED ORDER — LACTATED RINGERS IV SOLN
INTRAVENOUS | Status: DC | PRN
  Administered 2013-11-01 (×2): via INTRAVENOUS

## 2013-11-01 MED ORDER — BUPIVACAINE LIPOSOME 1.3 % IJ SUSP
20.0000 mL | Freq: Once | INTRAMUSCULAR | Status: DC
  Filled 2013-11-01: qty 20

## 2013-11-01 MED ORDER — MIDAZOLAM HCL 5 MG/5ML IJ SOLN
INTRAMUSCULAR | Status: DC | PRN
  Administered 2013-11-01: 3 mg via INTRAVENOUS

## 2013-11-01 MED ORDER — DOCUSATE SODIUM 100 MG PO CAPS
100.0000 mg | ORAL_CAPSULE | Freq: Every day | ORAL | Status: DC | PRN
  Administered 2013-11-01 – 2013-11-02 (×2): 100 mg via ORAL

## 2013-11-01 MED ORDER — ONDANSETRON HCL 4 MG/2ML IJ SOLN
4.0000 mg | Freq: Four times a day (QID) | INTRAMUSCULAR | Status: DC | PRN
  Administered 2013-11-02: 4 mg via INTRAVENOUS
  Filled 2013-11-01: qty 2

## 2013-11-01 MED ORDER — MAGNESIUM GLUCONATE 500 MG PO TABS
500.0000 mg | ORAL_TABLET | Freq: Every day | ORAL | Status: DC
  Administered 2013-11-01 – 2013-11-04 (×4): 500 mg via ORAL
  Filled 2013-11-01 (×4): qty 1

## 2013-11-01 MED ORDER — ACETAMINOPHEN 650 MG RE SUPP: 650.0000 mg | Freq: Four times a day (QID) | RECTAL | Status: DC | PRN

## 2013-11-01 MED ORDER — LIDOCAINE HCL (CARDIAC) 20 MG/ML IV SOLN
INTRAVENOUS | Status: DC | PRN
  Administered 2013-11-01: 100 mg via INTRAVENOUS

## 2013-11-01 MED ORDER — DIPHENHYDRAMINE HCL 12.5 MG/5ML PO ELIX: 12.5000 mg | ORAL_SOLUTION | ORAL | Status: DC | PRN

## 2013-11-01 MED ORDER — HYDROMORPHONE HCL PF 1 MG/ML IJ SOLN
INTRAMUSCULAR | Status: AC
  Filled 2013-11-01: qty 2

## 2013-11-01 MED ORDER — GLYCOPYRROLATE 0.2 MG/ML IJ SOLN
INTRAMUSCULAR | Status: DC | PRN
  Administered 2013-11-01: .6 mg via INTRAVENOUS

## 2013-11-01 MED ORDER — METOCLOPRAMIDE HCL 5 MG/ML IJ SOLN: 5.0000 mg | Freq: Three times a day (TID) | INTRAMUSCULAR | Status: DC | PRN

## 2013-11-01 SURGICAL SUPPLY — 62 items
BANDAGE ESMARK 6X9 LF (GAUZE/BANDAGES/DRESSINGS) ×1 IMPLANT
BLADE SAG 18X100X1.27 (BLADE) ×2 IMPLANT
BLADE SAW SGTL 13X75X1.27 (BLADE) ×2 IMPLANT
BLADE SURG ROTATE 9660 (MISCELLANEOUS) ×2 IMPLANT
BNDG ELASTIC 6X10 VLCR STRL LF (GAUZE/BANDAGES/DRESSINGS) ×2 IMPLANT
BNDG ELASTIC 6X15 VLCR STRL LF (GAUZE/BANDAGES/DRESSINGS) ×2 IMPLANT
BNDG ESMARK 6X9 LF (GAUZE/BANDAGES/DRESSINGS) ×2
BOWL SMART MIX CTS (DISPOSABLE) ×2 IMPLANT
CAPT RP KNEE ×2 IMPLANT
CEMENT HV SMART SET (Cement) ×4 IMPLANT
CLOTH BEACON ORANGE TIMEOUT ST (SAFETY) ×2 IMPLANT
COVER SURGICAL LIGHT HANDLE (MISCELLANEOUS) ×2 IMPLANT
CUFF TOURNIQUET SINGLE 34IN LL (TOURNIQUET CUFF) ×2 IMPLANT
CUFF TOURNIQUET SINGLE 44IN (TOURNIQUET CUFF) IMPLANT
DRAPE EXTREMITY T 121X128X90 (DRAPE) ×2 IMPLANT
DRAPE U-SHAPE 47X51 STRL (DRAPES) ×2 IMPLANT
DURAPREP 26ML APPLICATOR (WOUND CARE) ×4 IMPLANT
ELECT REM PT RETURN 9FT ADLT (ELECTROSURGICAL) ×2
ELECTRODE REM PT RTRN 9FT ADLT (ELECTROSURGICAL) ×1 IMPLANT
EVACUATOR 1/8 PVC DRAIN (DRAIN) ×2 IMPLANT
GAUZE XEROFORM 1X8 LF (GAUZE/BANDAGES/DRESSINGS) ×2 IMPLANT
GLOVE BIO SURGEON STRL SZ7.5 (GLOVE) ×2 IMPLANT
GLOVE BIO SURGEON STRL SZ8.5 (GLOVE) ×4 IMPLANT
GLOVE BIOGEL PI IND STRL 6.5 (GLOVE) ×1 IMPLANT
GLOVE BIOGEL PI IND STRL 7.0 (GLOVE) ×1 IMPLANT
GLOVE BIOGEL PI IND STRL 8 (GLOVE) ×2 IMPLANT
GLOVE BIOGEL PI IND STRL 9 (GLOVE) ×1 IMPLANT
GLOVE BIOGEL PI INDICATOR 6.5 (GLOVE) ×1
GLOVE BIOGEL PI INDICATOR 7.0 (GLOVE) ×1
GLOVE BIOGEL PI INDICATOR 8 (GLOVE) ×2
GLOVE BIOGEL PI INDICATOR 9 (GLOVE) ×1
GLOVE ECLIPSE 6.5 STRL STRAW (GLOVE) ×4 IMPLANT
GOWN PREVENTION PLUS XLARGE (GOWN DISPOSABLE) ×4 IMPLANT
GOWN STRL NON-REIN LRG LVL3 (GOWN DISPOSABLE) IMPLANT
GOWN STRL REIN XL XLG (GOWN DISPOSABLE) ×2 IMPLANT
HANDPIECE INTERPULSE COAX TIP (DISPOSABLE) ×1
HOOD PEEL AWAY FACE SHEILD DIS (HOOD) ×4 IMPLANT
KIT BASIN OR (CUSTOM PROCEDURE TRAY) ×2 IMPLANT
KIT ROOM TURNOVER OR (KITS) ×2 IMPLANT
MANIFOLD NEPTUNE II (INSTRUMENTS) ×2 IMPLANT
NDL SAFETY ECLIPSE 18X1.5 (NEEDLE) IMPLANT
NEEDLE HYPO 18GX1.5 SHARP (NEEDLE)
NEEDLE SPNL 18GX3.5 QUINCKE PK (NEEDLE) ×2 IMPLANT
NS IRRIG 1000ML POUR BTL (IV SOLUTION) ×2 IMPLANT
PACK TOTAL JOINT (CUSTOM PROCEDURE TRAY) ×2 IMPLANT
PAD ARMBOARD 7.5X6 YLW CONV (MISCELLANEOUS) ×2 IMPLANT
PADDING CAST COTTON 6X4 STRL (CAST SUPPLIES) ×2 IMPLANT
SET HNDPC FAN SPRY TIP SCT (DISPOSABLE) ×1 IMPLANT
SPONGE GAUZE 4X4 12PLY (GAUZE/BANDAGES/DRESSINGS) ×4 IMPLANT
STAPLER VISISTAT 35W (STAPLE) ×2 IMPLANT
SUCTION FRAZIER TIP 10 FR DISP (SUCTIONS) ×2 IMPLANT
SUT VIC AB 0 CTX 36 (SUTURE) ×1
SUT VIC AB 0 CTX36XBRD ANTBCTR (SUTURE) ×1 IMPLANT
SUT VIC AB 1 CTX 36 (SUTURE) ×1
SUT VIC AB 1 CTX36XBRD ANBCTR (SUTURE) ×1 IMPLANT
SUT VIC AB 2-0 CT1 27 (SUTURE) ×1
SUT VIC AB 2-0 CT1 TAPERPNT 27 (SUTURE) ×1 IMPLANT
SYR 50ML LL SCALE MARK (SYRINGE) ×2 IMPLANT
TOWEL OR 17X24 6PK STRL BLUE (TOWEL DISPOSABLE) ×2 IMPLANT
TOWEL OR 17X26 10 PK STRL BLUE (TOWEL DISPOSABLE) ×2 IMPLANT
TRAY FOLEY CATH 14FR (SET/KITS/TRAYS/PACK) IMPLANT
WATER STERILE IRR 1000ML POUR (IV SOLUTION) ×2 IMPLANT

## 2013-11-01 NOTE — Progress Notes (Signed)
Orthopedic Tech Progress Note Patient Details:  Bobby White Oct 17, 1961 [id number] CPM applied to Left LE with appropriate settings. Patient refused application of OHF.   CPM Left Knee CPM Left Knee: On Left Knee Flexion (Degrees): 60 Left Knee Extension (Degrees): 0   Asia R Thompson 11/01/2013, 1:44 PM

## 2013-11-01 NOTE — Anesthesia Preprocedure Evaluation (Addendum)
Anesthesia Evaluation  Patient identified by MRN, date of birth, ID band Patient awake    Reviewed: Allergy & Precautions, H&P , NPO status , Patient's Chart, lab work & pertinent test results  Airway Mallampati: II TM Distance: >3 FB Neck ROM: Full    Dental  (+) Dental Advisory Given   Pulmonary sleep apnea , former smoker,          Cardiovascular hypertension, + CAD, + CABG and + Peripheral Vascular Disease     Neuro/Psych    GI/Hepatic GERD-  ,(+) Hepatitis -  Endo/Other  diabetes, Well Controlled, Type 2  Renal/GU      Musculoskeletal   Abdominal   Peds  Hematology   Anesthesia Other Findings   Reproductive/Obstetrics                          Anesthesia Physical Anesthesia Plan  ASA: III  Anesthesia Plan: General   Post-op Pain Management:    Induction: Intravenous  Airway Management Planned: Oral ETT  Additional Equipment:   Intra-op Plan:   Post-operative Plan: Extubation in OR  Informed Consent: I have reviewed the patients History and Physical, chart, labs and discussed the procedure including the risks, benefits and alternatives for the proposed anesthesia with the patient or authorized representative who has indicated his/her understanding and acceptance.     Plan Discussed with:   Anesthesia Plan Comments:         Anesthesia Quick Evaluation

## 2013-11-01 NOTE — Op Note (Signed)
PATIENT ID:      Bobby White  MRN:     [id number] DOB/AGE:    Aug 10, 1961 / 52 y.o.       OPERATIVE REPORT    DATE OF PROCEDURE:  11/01/2013       PREOPERATIVE DIAGNOSIS:   OSTEOARTHRITIS LEFT KNEE      Estimated body mass index is 38.15 kg/(m^2) as calculated from the following:   Height as of 10/26/13: 5\' 11"  (1.803 m).   Weight as of 08/31/13: 124.03 kg (273 lb 7 oz).                                                        POSTOPERATIVE DIAGNOSIS:   OSTEOARTHRITIS LEFT KNEE                                                                      PROCEDURE:  Procedure(s): TOTAL KNEE ARTHROPLASTY- LEFT Using Depuy Sigma RP implants #5L Femur, #5Tibia, 10mm sigma RP bearing, 41 Patella     SURGEON: Agustina Witzke J    ASSISTANT:   Eric K. [employer]   (Present and scrubbed throughout the case, critical for assistance with exposure, retraction, instrumentation, and closure.)         ANESTHESIA: GET Exparel  DRAINS: foley, 2 medium hemovac in knee   TOURNIQUET TIME:   COMPLICATIONS:  None     SPECIMENS: None   INDICATIONS FOR PROCEDURE: The patient has  OSTEOARTHRITIS LEFT KNEE, varus deformities, XR shows bone on bone arthritis. Patient has failed all conservative measures including anti-inflammatory medicines, narcotics, attempts at  exercise and weight loss, cortisone injections and viscosupplementation.  Risks and benefits of surgery have been discussed, questions answered.   DESCRIPTION OF PROCEDURE: The patient identified by armband, received  IV antibiotics, in the holding area at [facility]. Patient taken to the operating room, appropriate anesthetic  monitors were attached, and general endotracheal anesthesia induced with  the patient in supine position, Foley catheter was inserted. Tourniquet  applied high to the operative thigh. Lateral post and foot positioner  applied to the table, the lower extremity was then prepped and draped  in usual sterile fashion  from the ankle to the tourniquet. Time-out procedure was performed. The limb was wrapped with an Esmarch bandage and the tourniquet inflated to 350 mmHg. We began the operation by making the anterior midline incision starting at handbreadth above the patella going over the patella 1 cm medial to and  4 cm distal to the tibial tubercle. Small bleeders in the skin and the  subcutaneous tissue identified and cauterized. Transverse retinaculum was incised and reflected medially and a medial parapatellar arthrotomy was accomplished. the patella was everted and theprepatellar fat pad resected. The superficial medial collateral  ligament was then elevated from anterior to posterior along the proximal  flare of the tibia and anterior half of the menisci resected. The knee was hyperflexed exposing bone on bone arthritis. Peripheral and notch osteophytes as well as the cruciate ligaments were then resected. We continued to  work our way around  posteriorly along the proximal tibia, and externally  rotated the tibia subluxing it out from underneath the femur. A McHale  retractor was placed through the notch and a lateral Hohmann retractor  placed, and we then drilled through the proximal tibia in line with the  axis of the tibia followed by an intramedullary guide rod and 2-degree  posterior slope cutting guide. The tibial cutting guide was pinned into place  allowing resection of 6 mm of bone medially and about 11 mm of bone  laterally because of her varus deformity. Satisfied with the tibial resection, we then  entered the distal femur 2 mm anterior to the PCL origin with the  intramedullary guide rod and applied the distal femoral cutting guide  set at 11mm, with 5 degrees of valgus. This was pinned along the  epicondylar axis. At this point, the distal femoral cut was accomplished without difficulty. We then sized for a #5L femoral component and pinned the guide in 3 degrees of external rotation.The chamfer  cutting guide was pinned into place. The anterior, posterior, and chamfer cuts were accomplished without difficulty followed by  the Sigma RP box cutting guide and the box cut. We also removed posterior osteophytes from the posterior femoral condyles. At this  time, the knee was brought into full extension. We checked our  extension and flexion gaps and found them symmetric at 10mm.  The patella thickness measured at 26 mm. We set the cutting guide at 15 and removed the posterior 11 mm  of the patella, sized for a 41 button and drilled the lollipop. The knee  was then once again hyperflexed exposing the proximal tibia. We sized for a #5 tibial base plate, applied the smokestack and the conical reamer followed by the the Delta fin keel punch. We then hammered into place the Sigma RP trial femoral component, inserted a 10-mm trial bearing, trial patellar button, and took the knee through range of motion from 0-130 degrees. No thumb pressure was required for patellar  tracking. At this point, all trial components were removed, a double batch of DePuy HV cement with 1500 mg of Zinacef was mixed and applied to all bony metallic mating surfaces except for the posterior condyles of the femur itself. In order, we  hammered into place the tibial tray and removed excess cement, the femoral component and removed excess cement, a 10-mm Sigma RP bearing  was inserted, and the knee brought to full extension with compression.  The patellar button was clamped into place, and excess cement  removed. While the cement cured the wound was irrigated out with normal saline solution pulse lavage, and medium Hemovac drains were placed from an anterolateral  approach. Ligament stability and patellar tracking were checked and found to be excellent. The parapatellar arthrotomy was closed with  running #1 Vicryl suture. The subcutaneous tissue with 0 and 2-0 undyed  Vicryl suture, and the skin with skin staples. A dressing of  Xeroform,  4 x 4, dressing sponges, Webril, and Ace wrap applied. The patient  awakened, extubated, and taken to recovery room without difficulty.   Chaniah Cisse J 11/01/2013, 8:58 AM

## 2013-11-01 NOTE — Interval H&P Note (Signed)
History and Physical Interval Note:  11/01/2013 7:17 AM  Bobby White  has presented today for surgery, with the diagnosis of OSTEOARTHRITIS LEFT KNEE  The various methods of treatment have been discussed with the patient and family. After consideration of risks, benefits and other options for treatment, the patient has consented to  Procedure(s): TOTAL KNEE ARTHROPLASTY (Left) as a surgical intervention .  The patient's history has been reviewed, patient examined, no change in status, stable for surgery.  I have reviewed the patient's chart and labs.  Questions were answered to the patient's satisfaction.     Nestor Lewandowsky

## 2013-11-01 NOTE — Preoperative (Signed)
Beta Blockers   Reason not to administer Beta Blockers:Not Applicable 

## 2013-11-01 NOTE — Evaluation (Signed)
Physical Therapy Evaluation Patient Details Name: Bobby White MRN: [id number] DOB: February 13, 1961 Today's Date: 11/01/2013 Time: 1478-2956 PT Time Calculation (min): 24 min  PT Assessment / Plan / Recommendation History of Present Illness  LTKA  Clinical Impression  Pt is s/p TKA resulting in the deficits listed below (see PT Problem List).  Pt will benefit from skilled PT to increase their independence and safety with mobility to allow discharge to the venue listed below.      PT Assessment  Patient needs continued PT services    Follow Up Recommendations  Home health PT    Does the patient have the potential to tolerate intense rehabilitation      Barriers to Discharge        Equipment Recommendations  None recommended by PT    Recommendations for Other Services OT consult   Frequency 7X/week    Precautions / Restrictions Precautions Precautions: Knee Restrictions Weight Bearing Restrictions: Yes LLE Weight Bearing: Weight bearing as tolerated   Pertinent Vitals/Pain 8/10 L knee in standing; pt unable to tolerate amb due to pain; RN provided medication to assist with pain control       Mobility  Bed Mobility Bed Mobility: Supine to Sit;Sitting - Scoot to Edge of Bed Supine to Sit: 4: Min assist Sitting - Scoot to [place] of Bed: 4: Min assist Details for Bed Mobility Assistance: Cues for technique Transfers Transfers: Sit to Stand;Stand to Sit Sit to Stand: 4: Min assist;From bed Stand to Sit: 4: Min assist;To bed Details for Transfer Assistance: Cues for hand placement and technique Ambulation/Gait Ambulation/Gait Assistance: Not tested (comment)    Exercises Total Joint Exercises Quad Sets: AROM;Left;5 reps Heel Slides: AAROM;Left;Other (comment) (2 res)   PT Diagnosis: Difficulty walking  PT Problem List: Decreased strength;Decreased range of motion;Decreased activity tolerance;Decreased balance;Decreased mobility;Decreased knowledge of use of  DME;Decreased safety awareness;Pain PT Treatment Interventions: DME instruction;Gait training;Stair training;Functional mobility training;Therapeutic activities;Therapeutic exercise;Patient/family education     PT Goals(Current goals can be found in the care plan section) Acute Rehab PT Goals Patient Stated Goal: back to work PT Goal Formulation: With patient Time For Goal Achievement: 11/08/13 Potential to Achieve Goals: Good  Visit Information  Last PT Received On: 11/01/13 Assistance Needed: +1 History of Present Illness: LTKA       Prior Functioning  Home Living Family/patient expects to be discharged to:: Private residence Living Arrangements: Spouse/significant other Available Help at Discharge: Family Type of Home: House Home Access: Stairs to enter Secretary/administrator of Steps: 3 STE  Entrance Stairs-Rails: None Home Layout: Two level Alternate Level Stairs-Number of Steps: 5-7 Alternate Level Stairs-Rails: Left Home Equipment: Cane - single point;Walker - 2 wheels;Bedside commode Prior Function Level of Independence: Independent Communication Communication: No difficulties Dominant Hand: Right    Cognition  Cognition Arousal/Alertness: Awake/alert Behavior During Therapy: WFL for tasks assessed/performed Overall Cognitive Status: Within Functional Limits for tasks assessed    Extremity/Trunk Assessment Upper Extremity Assessment Upper Extremity Assessment: Overall WFL for tasks assessed Lower Extremity Assessment Lower Extremity Assessment: LLE deficits/detail LLE Deficits / Details: Decr AROM and sterength, limited by pain postop   Balance    End of Session PT - End of Session Equipment Utilized During Treatment: Gait belt Activity Tolerance: Patient limited by pain Patient left: in bed;with call bell/phone within reach Nurse Communication: Mobility status;Patient requests pain meds   GP     Bobby White Bobby White Bobby White, Bobby White  [phone]  11/01/2013, 4:01 PM

## 2013-11-01 NOTE — Care Management Note (Signed)
CARE MANAGEMENT NOTE 11/01/2013  Patient:  Bobby White, Bobby White   Account Number:  [id number]  Date Initiated:  11/01/2013  Documentation initiated by:  Vance Peper  Subjective/Objective Assessment:   52 yr old male s/p left total knee arthroplasty     Action/Plan:   PT/OT eval   Anticipated DC Date:     Anticipated DC Plan:  HOME W HOME HEALTH SERVICES      DC Planning Services  CM consult      Choice offered to / List presented to:             Status of service:  In process, will continue to follow

## 2013-11-01 NOTE — Anesthesia Postprocedure Evaluation (Signed)
  Anesthesia Post-op Note  Patient: Bobby White  Procedure(s) Performed: Procedure(s): TOTAL KNEE ARTHROPLASTY- LEFT (Left)  Patient Location: PACU  Anesthesia Type:General and GA combined with regional for post-op pain  Level of Consciousness: awake, alert , oriented and patient cooperative  Airway and Oxygen Therapy: Patient Spontanous Breathing  Post-op Pain: moderate  Post-op Assessment: Post-op Vital signs reviewed, Patient's Cardiovascular Status Stable, Respiratory Function Stable, Patent Airway, No signs of Nausea or vomiting and Pain level controlled  Post-op Vital Signs: stable  Complications: No apparent anesthesia complications

## 2013-11-01 NOTE — Transfer of Care (Signed)
Immediate Anesthesia Transfer of Care Note  Patient: Bobby White  Procedure(s) Performed: Procedure(s): TOTAL KNEE ARTHROPLASTY- LEFT (Left)  Patient Location: PACU  Anesthesia Type:General  Level of Consciousness: sedated  Airway & Oxygen Therapy: Patient Spontanous Breathing and Patient connected to nasal cannula oxygen  Post-op Assessment: Report given to PACU RN and Post -op Vital signs reviewed and stable  Post vital signs: Reviewed and stable  Complications: No apparent anesthesia complications

## 2013-11-01 NOTE — Anesthesia Procedure Notes (Addendum)
Anesthesia Regional Block:  Femoral nerve block  Pre-Anesthetic Checklist: ,, timeout performed, Correct Patient, Correct Site, Correct Laterality, Correct Procedure, Correct Position, site marked, Risks and benefits discussed,  Surgical consent,  Pre-op evaluation,  At surgeon's request and post-op pain management  Laterality: Left  Prep: chloraprep and alcohol swabs       Needles:  Injection technique: Single-shot  Needle Type: Stimulator Needle - 80        Needle insertion depth: 7 cm   Additional Needles:  Procedures: nerve stimulator Femoral nerve block  Nerve Stimulator or Paresthesia:  Response: 0.5 mA, 0.1 ms, 7 cm  Additional Responses:   Narrative:  Start time: 11/01/2013 7:05 AM End time: 11/01/2013 7:10 AM Injection made incrementally with aspirations every 5 mL.  Performed by: Personally  Anesthesiologist: Maren Beach MD  Additional Notes: Pt accepts procedure w/ risks. 14cc 0.5% Marcaine w/ epi w/o difficulty or discomfort. Pt tolerated well. GES   Procedure Name: Intubation Date/Time: 11/01/2013 7:38 AM Performed by: Armandina Gemma Pre-anesthesia Checklist: Patient identified, Timeout performed, Emergency Drugs available, Suction available and Patient being monitored Patient Re-evaluated:Patient Re-evaluated prior to inductionOxygen Delivery Method: Circle system utilized Preoxygenation: Pre-oxygenation with 100% oxygen Intubation Type: IV induction Ventilation: Mask ventilation with difficulty Laryngoscope Size: Miller and 2 Grade View: Grade I Tube type: Oral Tube size: 7.5 mm Number of attempts: 1 Airway Equipment and Method: Stylet Placement Confirmation: breath sounds checked- equal and bilateral,  ETT inserted through vocal cords under direct vision and positive ETCO2 Secured at: 23 cm Tube secured with: Tape Dental Injury: Teeth and Oropharynx as per pre-operative assessment  Comments: IV induction Smith - intubation AM CRNA  atraumatic teeth and mouth as preop

## 2013-11-01 NOTE — Plan of Care (Signed)
Problem: Consults Goal: Diagnosis- Total Joint Replacement Primary Total Knee Left     

## 2013-11-01 NOTE — Progress Notes (Addendum)
May give additional Dilaudid up to 6 mg total per DR Katrinka Blazing

## 2013-11-01 NOTE — Progress Notes (Signed)
Utilization review completed.  

## 2013-11-02 ENCOUNTER — Encounter (HOSPITAL_COMMUNITY): Payer: Self-pay | Admitting: General Practice

## 2013-11-02 LAB — CBC
HCT: 33.4 % — ABNORMAL LOW (ref 39.0–52.0)
MCH: 25.4 pg — ABNORMAL LOW (ref 26.0–34.0)
MCV: 80.7 fL (ref 78.0–100.0)
Platelets: 211 10*3/uL (ref 150–400)
RDW: 14.1 % (ref 11.5–15.5)
WBC: 8.2 10*3/uL (ref 4.0–10.5)

## 2013-11-02 LAB — BASIC METABOLIC PANEL
BUN: 12 mg/dL (ref 6–23)
Calcium: 8.4 mg/dL (ref 8.4–10.5)
Chloride: 99 mEq/L (ref 96–112)
Creatinine, Ser: 0.66 mg/dL (ref 0.50–1.35)
GFR calc Af Amer: >90 mL/min (ref >90)
Glucose, Bld: 112 mg/dL — ABNORMAL HIGH (ref 70–99)

## 2013-11-02 NOTE — Progress Notes (Signed)
Physical Therapy Treatment Patient Details Name: SEFERINO OSCAR MRN: [id number] DOB: [birth year] Today's Date: 11/02/2013 Time: 7829-5621 PT Time Calculation (min): 24 min  PT Assessment / Plan / Recommendation  History of Present Illness LTKA on 11/01/13.   PT Comments   Pt cont's to make progress with mobility but still reports pain as mod/severe despite being given pain medication.     Follow Up Recommendations  Home health PT     Does the patient have the potential to tolerate intense rehabilitation     Barriers to Discharge        Equipment Recommendations  None recommended by PT    Recommendations for Other Services OT consult  Frequency 7X/week   Progress towards PT Goals Progress towards PT goals: Progressing toward goals  Plan Current plan remains appropriate    Precautions / Restrictions Precautions Precautions: Knee Restrictions Weight Bearing Restrictions: Yes LLE Weight Bearing: Weight bearing as tolerated   Pertinent Vitals/Pain 8/10 Lt knee.  Premedicated.  Repositioned for comfort.      Mobility  Bed Mobility Bed Mobility: Not assessed Transfers Transfers: Sit to Stand;Stand to Sit Sit to Stand: 5: Supervision;With upper extremity assist;With armrests;From chair/3-in-1;From toilet Stand to Sit: 5: Supervision;With upper extremity assist;With armrests;To chair/3-in-1;To toilet Details for Transfer Assistance: More effortful for sit<>stand from lower surface (toilet), but used grab bars.  Pt did not want to use 3-in-1.   Ambulation/Gait Ambulation/Gait Assistance: 4: Min guard Ambulation Distance (Feet): 100 Feet Assistive device: Rolling walker Ambulation/Gait Assistance Details: cues to relax UE's, increase heel strike Lt foot, & increase WBing LLE.  Lt knee stable.   Gait Pattern: Step-to pattern;Decreased step length - right;Decreased weight shift to left Gait velocity: decreased General Gait Details: relies heavily on RW with UE's.   Stairs:  No Wheelchair Mobility Wheelchair Mobility: No      PT Goals (current goals can now be found in the care plan section) Acute Rehab PT Goals Patient Stated Goal: Go back to work PT Goal Formulation: With patient Time For Goal Achievement: 11/08/13 Potential to Achieve Goals: Good  Visit Information  Last PT Received On: 11/02/13 Assistance Needed: +1 History of Present Illness: LTKA on 11/01/13.    Subjective Data  Patient Stated Goal: Go back to work   Cognition  Cognition Arousal/Alertness: Awake/alert Behavior During Therapy: WFL for tasks assessed/performed Overall Cognitive Status: Within Functional Limits for tasks assessed    Balance  Balance Balance Assessed: Yes Static Sitting Balance Static Sitting - Balance Support: No upper extremity supported;Feet supported Static Standing Balance Static Standing - Balance Support: Bilateral upper extremity supported;No upper extremity supported;During functional activity  End of Session PT - End of Session Equipment Utilized During Treatment: Gait belt Activity Tolerance: Patient tolerated treatment well Patient left: in chair;with call bell/phone within reach Nurse Communication: Mobility status   GP     Lara Mulch 11/02/2013, 1:02 PM   Verdell Face, PTA [phone] 11/02/2013

## 2013-11-02 NOTE — Progress Notes (Signed)
Patient ID: JOMO FORAND, male   DOB: 05-08-1961, 52 y.o.   MRN: [id number] PATIENT ID: PEARL BERLINGER  MRN: [id number]  DOB/AGE:  1961/01/29 / 52 y.o.  1 Day Post-Op Procedure(s) (LRB): TOTAL KNEE ARTHROPLASTY- LEFT (Left)    PROGRESS NOTE Subjective: Patient is alert, oriented, 1x Nausea, no Vomiting, yes passing gas, no Bowel Movement. Taking PO well. Denies SOB, Chest or Calf Pain. Using Incentive Spirometer, PAS in place. Ambulate WBAT in room, CPM 0-60. Patient reports pain as 4 on 0-10 scale  .    Objective: Vital signs in last 24 hours: Filed Vitals:   11/01/13 1308 11/01/13 2007 11/02/13 0200 11/02/13 0548  BP: 129/71 152/80 123/72 129/75  Pulse: 86 80 78 78  Temp: 98.5 F (36.9 C) 98 F (36.7 C) 98.1 F (36.7 C) 98 F (36.7 C)  TempSrc: Oral Oral    Resp: 18 18 18 18   SpO2: 99% 98% 97% 98%      Intake/Output from previous day: I/O last 3 completed shifts: In: 1830 [P.O.:580; I.V.:1250] Out: 975 [Urine:475; Drains:450; Blood:50]   Intake/Output this shift: Total I/O In: 1120 [P.O.:120; I.V.:1000] Out: 1000 [Urine:850; Drains:150]   LABORATORY DATA:  Recent Labs  11/02/13 0423  WBC 8.2  HGB 10.5*  HCT 33.4*  PLT 211  NA 135  K 3.9  CL 99  CO2 28  BUN 12  CREATININE 0.66  GLUCOSE 112*  CALCIUM 8.4    Examination: Neurologically intact ABD soft Neurovascular intact Sensation intact distally Intact pulses distally Dorsiflexion/Plantar flexion intact Incision: no drainage No cellulitis present Compartment soft} Blood and plasma separated in drain indicating minimal recent drainage, drain pulled without difficulty.  Assessment:   1 Day Post-Op Procedure(s) (LRB): TOTAL KNEE ARTHROPLASTY- LEFT (Left) ADDITIONAL DIAGNOSIS:  Hypertension and  CAD, Obesity  Plan: PT/OT WBAT, CPM 5/hrs day until ROM 0-90 degrees, then D/C CPM DVT Prophylaxis:  SCDx72hrs, ASA 325 mg BID x 2 weeks DISCHARGE PLAN: Home DISCHARGE NEEDS: HHPT, HHRN, CPM, Walker  and 3-in-1 comode seat     Pranika Finks J 11/02/2013, 6:45 AM

## 2013-11-02 NOTE — Progress Notes (Deleted)
Pt had ST episode starting at 2345 that lasted about a one minute with HR 140-151. Asymptomatic, pt awake at the time, had pt bear down 3 times and HR quickly returned to 60s.

## 2013-11-02 NOTE — Care Management Note (Signed)
CARE MANAGEMENT NOTE 11/02/2013  Patient:  Bobby White, Bobby White   Account Number:  [id number]  Date Initiated:  11/01/2013  Documentation initiated by:  Vance Peper  Subjective/Objective Assessment:   52 yr old male s/p left total knee arthroplasty     Action/Plan:   PT/OT eval  CM spoke with patient concerning home health and DME needs. Pt.states he is setup with [facility]. CM faxed orders to [facility] [phone].Confirmation received.  patient has DME.   Anticipated DC Date:  11/03/2013   Anticipated DC Plan:  HOME W HOME HEALTH SERVICES      DC Planning Services  CM consult      [facility] Choice  HOME HEALTH   Choice offered to / List presented to:  C-1 Patient   DME arranged  CPM      DME agency  [employer]     HH arranged  HH-2 PT      [facility] agency  [facility]   Status of service:  Completed, signed off     Discharge Disposition:  HOME W HOME HEALTH SERVICES

## 2013-11-02 NOTE — Progress Notes (Signed)
Physical Therapy Treatment Patient Details Name: Bobby White MRN: [id number] DOB: [name], 1961 Today's Date: 11/02/2013 Time: 1191-4782 PT Time Calculation (min): 31 min  PT Assessment / Plan / Recommendation  History of Present Illness LTKA   PT Comments   Pt progressing with mobility as he was able to ambulate ~25' but is limited by pain.  Cont with current POC to maximize functional mobility & safety prior to d/c home.     Follow Up Recommendations  Home health PT     Does the patient have the potential to tolerate intense rehabilitation     Barriers to Discharge        Equipment Recommendations  None recommended by PT    Recommendations for Other Services OT consult  Frequency 7X/week   Progress towards PT Goals Progress towards PT goals: Progressing toward goals  Plan Current plan remains appropriate    Precautions / Restrictions Precautions Precautions: Knee Restrictions LLE Weight Bearing: Weight bearing as tolerated   Pertinent Vitals/Pain 8/10 Lt knee.  Premedicated.      Mobility  Bed Mobility Bed Mobility: Supine to Sit;Sitting - Scoot to Edge of Bed Supine to Sit: 4: Min assist;HOB flat Sitting - Scoot to [place] of Bed: 4: Min assist Details for Bed Mobility Assistance: (A) for LLE management.  Instructed pt in hooking technique & cues for increased use of UE's as pt holding IV line & not using LUE.   Transfers Transfers: Sit to Stand;Stand to Sit Sit to Stand: 4: Min guard;With upper extremity assist;From bed Stand to Sit: 4: Min guard;With upper extremity assist;With armrests;To chair/3-in-1 Details for Transfer Assistance: cues for hand placement & LLE positioning before sitting Ambulation/Gait Ambulation/Gait Assistance: 4: Min guard Ambulation Distance (Feet): 25 Feet Assistive device: Rolling walker Ambulation/Gait Assistance Details: Pt demonstrated proper sequencing but is very tense/guarded due to pain.   Gait Pattern: Step-to  pattern;Decreased step length - right;Decreased weight shift to left;Antalgic Gait velocity: decreased Stairs: No Wheelchair Mobility Wheelchair Mobility: No    Exercises Total Joint Exercises Ankle Circles/Pumps: AROM;Both;10 reps Quad Sets: AROM;Strengthening;Both;10 reps Hip ABduction/ADduction: AAROM;Strengthening;Left;10 reps Straight Leg Raises: AAROM;Strengthening;Left;10 reps     PT Goals (current goals can now be found in the care plan section) Acute Rehab PT Goals Patient Stated Goal: back to work PT Goal Formulation: With patient Time For Goal Achievement: 11/08/13 Potential to Achieve Goals: Good  Visit Information  Last PT Received On: 11/02/13 Assistance Needed: +1 History of Present Illness: LTKA    Subjective Data  Patient Stated Goal: back to work   Cognition  Cognition Arousal/Alertness: Awake/alert Behavior During Therapy: WFL for tasks assessed/performed Overall Cognitive Status: Within Functional Limits for tasks assessed    Balance     End of Session PT - End of Session Equipment Utilized During Treatment: Gait belt Activity Tolerance: Patient tolerated treatment well;Patient limited by pain Patient left: in chair;with call bell/phone within reach Nurse Communication: Mobility status   GP     Lara Mulch 11/02/2013, 8:38 AM  Verdell Face, PTA [phone] 11/02/2013

## 2013-11-02 NOTE — Evaluation (Signed)
Occupational Therapy Evaluation Patient Details Name: Bobby White MRN: [id number] DOB: 16-Apr-1961 Today's Date: 11/02/2013 Time: 9562-1308 OT Time Calculation (min): 34 min  OT Assessment / Plan / Recommendation History of present illness LTKA on 11/01/13.   Clinical Impression   Pt presents s/p L TKA w/ impairments in his ability to independently perform LB ADL's and transfers. He will benefit from acute OT to assist in maximizing independence w/ tub transfers and LB dressing prior to anticipated return home w/ 24/PRN assist. Pt has necessary DME. Practice Tub transfer w/ bench next visit.    OT Assessment  Patient needs continued OT Services    Follow Up Recommendations  No OT follow up;Supervision/Assistance - 24 hour    Barriers to Discharge      Equipment Recommendations  None recommended by OT;Other (comment) (Pt has DME)    Recommendations for Other Services    Frequency  Min 2X/week    Precautions / Restrictions Precautions Precautions: Knee Restrictions Weight Bearing Restrictions: Yes LLE Weight Bearing: Weight bearing as tolerated   Pertinent Vitals/Pain 8/10 L knee pain. Pt reports that he had been premedicated prior to treatment session. Repositioned after therapy session, rest & ice applied left knee.    ADL  Grooming: Performed;Wash/dry hands;Wash/dry face;Supervision/safety (Standing at sink) Where Assessed - Grooming: Unsupported standing Upper Body Bathing: Simulated;Set up Where Assessed - Upper Body Bathing: Unsupported sitting Lower Body Bathing: Simulated;Minimal assistance Where Assessed - Lower Body Bathing: Supported sit to stand Upper Body Dressing: Simulated;Set up Where Assessed - Upper Body Dressing: Unsupported sitting Lower Body Dressing: Simulated;Minimal assistance Where Assessed - Lower Body Dressing: Supported sit to stand Toilet Transfer: Performed;Supervision/safety (Ambulating into bathroom) Toilet Transfer Method: Sit to  Barista: Comfort height toilet;Grab bars;Other (comment) (Pt has handicapped height toilet & grab bars at home) Toileting - Clothing Manipulation and Hygiene: Performed;Supervision/safety Where Assessed - Toileting Clothing Manipulation and Hygiene: Sit to stand from 3-in-1 or toilet Tub/Shower Transfer Method: Not assessed Equipment Used: Rolling walker Transfers/Ambulation Related to ADLs: Pt is overall Min guard - supervision level assist for transfers from chair level, w/ increased time secondary to pain L knee, using RW. ADL Comments: Pt was educated in role of OT. He has all DME at home, prefers to use toilet w/o 3:1 as he has handicapped height toilet at home. Pt participated in ADL retraining session today standing at sink & for transfers to/from toilet. Pt requires increased time for tasks secondary to pain LLE/knee & will benefit from acute OT tfor tub transfer using bench next visit.    OT Diagnosis: Acute pain;Generalized weakness  OT Problem List: Decreased activity tolerance;Decreased knowledge of precautions;Decreased knowledge of use of DME or AE;Pain OT Treatment Interventions: Self-care/ADL training;DME and/or AE instruction;Patient/family education;Therapeutic activities   OT Goals(Current goals can be found in the care plan section) Acute Rehab OT Goals Patient Stated Goal: Go back to work OT Goal Formulation: With patient Time For Goal Achievement: 11/16/13 Potential to Achieve Goals: Good  Visit Information  Last OT Received On: 11/02/13 Assistance Needed: +1 History of Present Illness: LTKA on 11/01/13.       Prior Functioning     Home Living Family/patient expects to be discharged to:: Private residence Living Arrangements: Spouse/significant other Available Help at Discharge: Family Type of Home: House Home Access: Stairs to enter Secretary/administrator of Steps: 3 STE  Entrance Stairs-Rails: None Home Layout: Two level Alternate  Level Stairs-Number of Steps: 5-7 Alternate Level Stairs-Rails: Left Home Equipment: Gilmer Mor -  single point;Walker - 2 wheels;Bedside commode;Tub bench;Grab bars - tub/shower;Hand held shower head Prior Function Level of Independence: Independent Communication Communication: No difficulties Dominant Hand: Right    Vision/Perception Vision - History Baseline Vision: Wears glasses only for reading Patient Visual Report: No change from baseline   Cognition  Cognition Arousal/Alertness: Awake/alert Behavior During Therapy: WFL for tasks assessed/performed Overall Cognitive Status: Within Functional Limits for tasks assessed    Extremity/Trunk Assessment Upper Extremity Assessment Upper Extremity Assessment: Overall WFL for tasks assessed Lower Extremity Assessment Lower Extremity Assessment: Defer to PT evaluation    Mobility Bed Mobility Bed Mobility: Not assessed (Pt up in chair) Details for Bed Mobility Assistance: (A) for LLE management.  Instructed pt in hooking technique & cues for increased use of UE's as pt holding IV line & not using LUE.   Transfers Transfers: Sit to Stand;Stand to Sit Sit to Stand: 5: Supervision;4: Min guard;From chair/3-in-1;From toilet;With armrests Stand to Sit: 5: Supervision;4: Min guard;To chair/3-in-1;To toilet;With armrests Details for Transfer Assistance: Cues for hand placement & LLE positioning & increased time.      Balance Balance Balance Assessed: Yes Static Sitting Balance Static Sitting - Balance Support: No upper extremity supported;Feet supported Static Standing Balance Static Standing - Balance Support: Bilateral upper extremity supported;No upper extremity supported;During functional activity   End of Session OT - End of Session Equipment Utilized During Treatment: Rolling walker Activity Tolerance: Patient limited by pain Patient left: in chair;with call bell/phone within reach Nurse Communication: Mobility status;Other (comment)  (Pt IV complete and beeping)  GO     Charletta Cousin, Elmon Shader Beth Dixon 11/02/2013, 9:38 AM

## 2013-11-03 LAB — CBC
HCT: 29.6 % — ABNORMAL LOW (ref 39.0–52.0)
MCH: 26.3 pg (ref 26.0–34.0)
MCHC: 33.4 g/dL (ref 30.0–36.0)
MCV: 78.7 fL (ref 78.0–100.0)
Platelets: 188 10*3/uL (ref 150–400)
RDW: 14 % (ref 11.5–15.5)
WBC: 8 10*3/uL (ref 4.0–10.5)

## 2013-11-03 NOTE — Progress Notes (Signed)
Physical Therapy Treatment Patient Details Name: Bobby White MRN: [id number] DOB: 09/14/1961 Today's Date: 11/03/2013 Time: 1340-1410 PT Time Calculation (min): 30 min  PT Assessment / Plan / Recommendation  History of Present Illness LTKA on 11/01/13.   PT Comments   Pt presents at min (A) for bed mobility, supervision for ambulation, & min guard for ambulation but mobility appears very effortful for pt due to pain.  Pt states he does not feel comfortable with d/cing home today.  RN made aware.     Follow Up Recommendations  Home health PT     Does the patient have the potential to tolerate intense rehabilitation     Barriers to Discharge        Equipment Recommendations  None recommended by PT    Recommendations for Other Services OT consult  Frequency 7X/week   Progress towards PT Goals Progress towards PT goals: Progressing toward goals  Plan Current plan remains appropriate    Precautions / Restrictions Precautions Precautions: Knee Restrictions LLE Weight Bearing: Weight bearing as tolerated   Pertinent Vitals/Pain 7/10 Lt knee & c/o abdominal pain due to not having BM.  Has had pain medication every 3 hrs per RN & just received latest dose right before PT session.      Mobility  Bed Mobility Bed Mobility: Supine to Sit;Sitting - Scoot to Edge of Bed;Sit to Supine Supine to Sit: 4: Min assist;HOB flat;With rails Sitting - Scoot to Edge of Bed: 4: Min assist Sit to Supine: 4: Min assist;HOB flat Details for Bed Mobility Assistance: (A) for LLE management.  Effortful & increased time due to pain.  Transfers Transfers: Sit to Stand;Stand to Sit Sit to Stand: 5: Supervision;With upper extremity assist;With armrests;From chair/3-in-1;From bed Stand to Sit: 5: Supervision;With upper extremity assist;With armrests;To bed;To chair/3-in-1 Details for Transfer Assistance: Cont's to be effortful & uses RLE to hook under LLE due to he does not tolerate Lt knee flexion.    Ambulation/Gait Ambulation/Gait Assistance: 5: Supervision Ambulation Distance (Feet): 45 Feet Assistive device: Rolling walker Ambulation/Gait Assistance Details: distance limited again by pain & fatigue Gait Pattern: Step-to pattern;Decreased weight shift to left;Antalgic Gait velocity: decreased Stairs: Yes Stairs Assistance: 4: Min guard Stairs Assistance Details (indicate cue type and reason): appeared very effortful although did not need physical (A).   Stair Management Technique: One rail Right;Step to pattern;Forwards Number of Stairs: 3 (2x's) Wheelchair Mobility Wheelchair Mobility: No      PT Goals (current goals can now be found in the care plan section) Acute Rehab PT Goals PT Goal Formulation: With patient Time For Goal Achievement: 11/08/13 Potential to Achieve Goals: Good  Visit Information  Last PT Received On: 11/03/13 Assistance Needed: +1 History of Present Illness: LTKA on 11/01/13.    Subjective Data      Cognition  Cognition Arousal/Alertness: Awake/alert Behavior During Therapy: WFL for tasks assessed/performed Overall Cognitive Status: Within Functional Limits for tasks assessed    Balance     End of Session PT - End of Session Activity Tolerance: Patient tolerated treatment well;Patient limited by pain Patient left: in bed;with call bell/phone within reach;Other (comment) (ice packs applied) Nurse Communication: Mobility status   GP     Lara Mulch 11/03/2013, 2:17 PM  Verdell Face, PTA [phone] 11/03/2013

## 2013-11-03 NOTE — Progress Notes (Signed)
OT Cancellation Note  Patient Details Name: Bobby White MRN: [id number] DOB: Jan 04, 1961   Cancelled Treatment:    Reason Eval/Treat Not Completed: Pain limiting ability to participate. Will re-attempt as time allows.  Earlie Raveling OTR/L [phone] 11/03/2013, 9:09 AM

## 2013-11-03 NOTE — Discharge Summary (Signed)
Patient ID: Bobby White MRN: [id number] DOB/AGE: 1961/02/06 52 y.o.  Admit date: 11/01/2013 Discharge date: 11/03/2013  Admission Diagnoses:  Active Problems:   Arthritis of left knee   Discharge Diagnoses:  Same  Past Medical History  Diagnosis Date  . Obesity   . Impaired fasting glucose 2009  . Varicose vein   . Hypertension 1/10  . GERD (gastroesophageal reflux disease)   . Arthritis   . Hepatitis     Hep C --had to get shots  . Sleep apnea   . Coronary artery disease     Surgeries: Procedure(s): TOTAL KNEE ARTHROPLASTY- LEFT on 11/01/2013   Consultants:    Discharged Condition: Improved  Hospital Course: Bobby White is an 52 y.o. male who was admitted 11/01/2013 for operative treatment of<principal problem not specified>. Patient has severe unremitting pain that affects sleep, daily activities, and work/hobbies. After pre-op clearance the patient was taken to the operating room on 11/01/2013 and underwent  Procedure(s): TOTAL KNEE ARTHROPLASTY- LEFT.    Patient was given perioperative antibiotics: Anti-infectives   Start     Dose/Rate Route Frequency Ordered Stop   11/01/13 0806  cefUROXime (ZINACEF) injection  Status:  Discontinued       As needed 11/01/13 0806 11/01/13 0921   11/01/13 0600  ceFAZolin (ANCEF) 3 g in dextrose 5 % 50 mL IVPB     3 g 160 mL/hr over 30 Minutes Intravenous On call to O.R. 10/31/13 1437 11/01/13 0740       Patient was given sequential compression devices, early ambulation, and chemoprophylaxis to prevent DVT.  Patient benefited maximally from hospital stay and there were no complications.    Recent vital signs: Patient Vitals for the past 24 hrs:  BP Temp Temp src Pulse Resp SpO2  11/03/13 0539 120/73 mmHg 99.4 F (37.4 C) Oral 93 18 99 %  11/02/13 2131 133/73 mmHg 99.9 F (37.7 C) Oral 90 18 99 %  11/02/13 2015 - 99.2 F (37.3 C) - - - -  11/02/13 1600 - - - - 18 98 %  11/02/13 1301 133/65 mmHg 99.1 F (37.3  C) - 87 20 99 %  11/02/13 1107 - - - - 20 98 %     Recent laboratory studies:  Recent Labs  11/02/13 0423 11/03/13 0335  WBC 8.2 8.0  HGB 10.5* 9.9*  HCT 33.4* 29.6*  PLT 211 188  NA 135  --   K 3.9  --   CL 99  --   CO2 28  --   BUN 12  --   CREATININE 0.66  --   GLUCOSE 112*  --   CALCIUM 8.4  --      Discharge Medications:     Medication List         albuterol 108 (90 BASE) MCG/ACT inhaler  Commonly known as:  PROVENTIL HFA;VENTOLIN HFA  Inhale 2 puffs into the lungs every 6 (six) hours as needed for wheezing.     amLODipine 5 MG tablet  Commonly known as:  NORVASC  Take 5 mg by mouth daily.     aspirin EC 325 MG tablet  Take 1 tablet (325 mg total) by mouth 2 (two) times daily.     b complex vitamins tablet  Take 1 tablet by mouth daily.     Cinnamon 500 MG capsule  Take 500 mg by mouth 2 (two) times daily.     ibuprofen 800 MG tablet  Commonly known as:  ADVIL,MOTRIN  Take  800 mg by mouth every 8 (eight) hours as needed (for pain).     KRILL OIL PO  Take 1 capsule by mouth daily.     magnesium gluconate 500 MG tablet  Commonly known as:  MAGONATE  Take 500 mg by mouth daily.     meloxicam 15 MG tablet  Commonly known as:  MOBIC  TAKE ONE TABLET BY MOUTH IN THE MORNING WITH BREAKFAST FOR 14 DAYS, AND THEN ONCE DAILY ONLY AS NEEDED     meloxicam 15 MG tablet  Commonly known as:  MOBIC  Take 15 mg by mouth daily.     methocarbamol 500 MG tablet  Commonly known as:  ROBAXIN  Take 1 tablet (500 mg total) by mouth 2 (two) times daily with a meal.     methocarbamol 750 MG tablet  Commonly known as:  ROBAXIN  Take 750 mg by mouth every 6 (six) hours as needed for muscle spasms.     metoprolol succinate 25 MG 24 hr tablet  Commonly known as:  TOPROL-XL  Take 25 mg by mouth daily.     nitroGLYCERIN 0.4 MG SL tablet  Commonly known as:  NITROSTAT  Place 0.4 mg under the tongue every 5 (five) minutes as needed for chest pain.     omeprazole  20 MG capsule  Commonly known as:  PRILOSEC  Take 20 mg by mouth daily.     oxyCODONE 5 MG immediate release tablet  Commonly known as:  Oxy IR/ROXICODONE  Take 1 tablet (5 mg total) by mouth every 6 (six) hours as needed (for pain).     simvastatin 40 MG tablet  Commonly known as:  ZOCOR  Take 40 mg by mouth every evening.     topiramate 50 MG tablet  Commonly known as:  TOPAMAX  Take 50 mg by mouth 2 (two) times daily with a meal.        Diagnostic Studies: Dg Pelvis Portable  11/01/2013   CLINICAL DATA:  Postop left knee.  Arthritis.  EXAM: PORTABLE PELVIS 1-2 VIEWS  COMPARISON:  None.  FINDINGS: Very mild hip joint space narrowing slightly greater on left. No plain film evidence of avascular necrosis of either femoral head. No bony destructive lesion.  No fracture or dislocation is noted on this single projection.  IMPRESSION: Very mild hip joint space narrowing slightly greater on the left.   Electronically Signed   By: Bridgett Larsson M.D.   On: 11/01/2013 10:21    Disposition: 03-Skilled Nursing Facility      Discharge Orders   Future Appointments Provider Department Dept Phone   11/22/2013 8:00 AM Daisy Floro, PT [facility] [phone]   11/24/2013 8:00 AM [facility] Sub Therapist 8 [facility] [phone]   11/26/2013 8:00 AM Daisy Floro, PT [facility] [phone]   11/29/2013 8:00 AM Lucas Mallow, PT [facility] [phone]   12/01/2013 8:00 AM [facility] Sub Therapist 8 [facility] [phone]   12/03/2013 8:00 AM Daisy Floro, PT [facility] [phone]   12/06/2013 8:00 AM Daisy Floro, PT [facility] [phone]   12/08/2013 4:00 PM [facility] Sub Therapist 8 [facility] [phone]   12/10/2013  8:00 AM Daisy Floro, PT [facility] [phone]   12/13/2013 8:00 AM Daisy Floro, PT [facility] [phone]   12/15/2013 8:00 AM Daisy Floro, PT [facility] [phone]   12/17/2013 8:00 AM Daisy Floro, PT [facility] [phone]   12/20/2013 8:00 AM Lucas Mallow, PT [facility] [phone]   12/22/2013 8:00 AM Daisy Floro, PT [facility] [phone]   12/24/2013 8:00 AM Daisy Floro, PT [facility] [phone]   04/26/2014 4:00 PM Agapito Games, MD [facility] [place] [phone]   Future Orders Complete By Expires   Call MD / Call 911  As directed    Comments:     If you experience chest pain or shortness of breath, CALL 911 and be transported to the hospital emergency room.  If you develope a fever above 101 F, pus (white drainage) or increased drainage or redness at the wound, or calf pain, call your surgeon's office.   Change dressing  As directed    Comments:     Change dressing on 5, then change the dressing daily with sterile 4 x 4 inch gauze dressing and apply TED hose.  You may clean the incision with alcohol prior to redressing.   Constipation Prevention  As directed    Comments:     Drink plenty of fluids.  Prune juice may be helpful.  You may use a stool softener, such as Colace (over the counter) 100 mg twice a day.  Use MiraLax (over the counter) for constipation as needed.   CPM  As directed    Comments:     Continuous passive motion machine (CPM):      Use the CPM from 0 to 60  for 5 hours per day.      You may increase by 10 degrees per day.  You may break it up into 2 or 3 sessions per day.      Use CPM for 2 weeks or until you are told to stop.   Diet - low sodium heart healthy  As  directed    Discharge instructions  As directed    Comments:     Follow up in 2 weeks with Dr. Turner Daniels in office   Driving restrictions  As directed    Comments:     No driving for 2 weeks   Increase activity slowly as tolerated  As directed    Patient may shower  As directed    Comments:     You may shower without a dressing once there is no drainage.  Do not wash over the wound.  If drainage remains, cover wound with plastic wrap and then shower.      Follow-up Information   Follow up with Nestor Lewandowsky, MD.   Specialty:  Orthopedic Surgery   Contact information:   [address] [place] [address] [phone]        Signed: Vear Clock, Alysiana Ethridge R 11/03/2013, 8:09 AM

## 2013-11-03 NOTE — Progress Notes (Signed)
Physical Therapy Treatment Patient Details Name: Bobby White MRN: [id number] DOB: 1961/06/14 Today's Date: 11/03/2013 Time: 4132-4401 PT Time Calculation (min): 23 min  PT Assessment / Plan / Recommendation  History of Present Illness LTKA on 11/01/13.   PT Comments   Overall pt moves well but is limited by pain.  Does not tolerate knee flexion well due to pain.  Pt deferred stair training this session due to pain but needs to practice next session due to anticipate home later today per pt.    Follow Up Recommendations  Home health PT     Does the patient have the potential to tolerate intense rehabilitation     Barriers to Discharge        Equipment Recommendations  None recommended by PT    Recommendations for Other Services OT consult  Frequency 7X/week   Progress towards PT Goals Progress towards PT goals: Progressing toward goals  Plan Current plan remains appropriate    Precautions / Restrictions Precautions Precautions: Knee Restrictions LLE Weight Bearing: Weight bearing as tolerated   Pertinent Vitals/Pain 7-8/10 Lt knee.  RN administered pain medication.  Repositioned for comfort.      Mobility  Bed Mobility Bed Mobility: Not assessed Transfers Transfers: Sit to Stand;Stand to Sit Sit to Stand: 5: Supervision;With upper extremity assist;From toilet;Other (comment);From bed (grab bars) Stand to Sit: 5: Supervision;With upper extremity assist;To bed;To toilet;To chair/3-in-1 Ambulation/Gait Ambulation/Gait Assistance: 5: Supervision Ambulation Distance (Feet): 75 Feet Assistive device: Rolling walker Ambulation/Gait Assistance Details: Distance limited by pain.  Relies heavily on RW with UE's.   Gait Pattern: Step-to pattern;Decreased weight shift to left Gait velocity: decreased Stairs: No (pt deferring at this time due to pain) Wheelchair Mobility Wheelchair Mobility: No    Exercises Total Joint Exercises Knee Flexion: AAROM;Left;5  reps;Limitations Knee Flexion Limitations: limited by pain     PT Goals (current goals can now be found in the care plan section) Acute Rehab PT Goals Patient Stated Goal: Go back to work PT Goal Formulation: With patient Time For Goal Achievement: 11/08/13 Potential to Achieve Goals: Good  Visit Information  Last PT Received On: 11/03/13 Assistance Needed: +1 History of Present Illness: LTKA on 11/01/13.    Subjective Data  Patient Stated Goal: Go back to work   Cognition  Cognition Arousal/Alertness: Awake/alert Behavior During Therapy: WFL for tasks assessed/performed Overall Cognitive Status: Within Functional Limits for tasks assessed    Balance     End of Session PT - End of Session Equipment Utilized During Treatment: Gait belt Activity Tolerance: Patient limited by pain Patient left: in chair;with call bell/phone within reach Nurse Communication: Mobility status   GP     Bobby White 11/03/2013, 9:22 AM   Verdell Face, PTA [phone] 11/03/2013

## 2013-11-03 NOTE — Progress Notes (Signed)
Orthopedic Tech Progress Note Patient Details:  Bobby White 1961/05/08 [id number]  Patient ID: Bobby White, male   DOB: [birth year], 52 y.o.   MRN: [id number] Placed pt's lle in cpm @ 0-45 degrees @4150   Nikki Dom 11/03/2013, 2:49 PM

## 2013-11-03 NOTE — Progress Notes (Signed)
PATIENT ID: AH BOTT  MRN: [id number]  DOB/AGE:  1961/07/13 / 52 y.o.  2 Days Post-Op Procedure(s) (LRB): TOTAL KNEE ARTHROPLASTY- LEFT (Left)    PROGRESS NOTE Subjective: Patient is alert, oriented, no Nausea, no Vomiting, yes passing gas, no Bowel Movement. Taking PO well. Denies SOB, Chest or Calf Pain. Using Incentive Spirometer, PAS in place. Ambulate wbat, CPM 0-60 Patient reports pain as mild and moderate  .    Objective: Vital signs in last 24 hours: Filed Vitals:   11/02/13 1600 11/02/13 2015 11/02/13 2131 11/03/13 0539  BP:   133/73 120/73  Pulse:   90 93  Temp:  99.2 F (37.3 C) 99.9 F (37.7 C) 99.4 F (37.4 C)  TempSrc:   Oral Oral  Resp: 18  18 18   SpO2: 98%  99% 99%      Intake/Output from previous day: I/O last 3 completed shifts: In: 4023.3 [P.O.:2040; I.V.:1983.3] Out: 3600 [Urine:3450; Drains:150]   Intake/Output this shift:     LABORATORY DATA:  Recent Labs  11/02/13 0423 11/03/13 0335  WBC 8.2 8.0  HGB 10.5* 9.9*  HCT 33.4* 29.6*  PLT 211 188  NA 135  --   K 3.9  --   CL 99  --   CO2 28  --   BUN 12  --   CREATININE 0.66  --   GLUCOSE 112*  --   CALCIUM 8.4  --     Examination: Neurologically intact Neurovascular intact Sensation intact distally Intact pulses distally Dorsiflexion/Plantar flexion intact Incision: dressing C/D/I No cellulitis present Compartment soft}  Assessment:   2 Days Post-Op Procedure(s) (LRB): TOTAL KNEE ARTHROPLASTY- LEFT (Left) ADDITIONAL DIAGNOSIS:  Hypertension and CAD, Obesity  Plan: PT/OT WBAT, CPM 5/hrs day until ROM 0-90 degrees, then D/C CPM DVT Prophylaxis:  SCDx72hrs, ASA 325 mg BID x 2 weeks DISCHARGE PLAN: Home when pt passes PT goals DISCHARGE NEEDS: HHPT, HHRN, CPM, Walker and 3-in-1 comode seat     Jarron Curley R 11/03/2013, 8:05 AM

## 2013-11-04 LAB — CBC
MCH: 26.1 pg (ref 26.0–34.0)
MCV: 78.7 fL (ref 78.0–100.0)
Platelets: 204 10*3/uL (ref 150–400)
RDW: 14.1 % (ref 11.5–15.5)
WBC: 7.2 10*3/uL (ref 4.0–10.5)

## 2013-11-04 NOTE — Progress Notes (Signed)
OT Cancellation Note and Discharge  Patient Details Name: HOLSTON OYAMA MRN: [id number] DOB: 1961-07-12   Cancelled Treatment:    Reason Eval/Treat Not Completed: Other (comment). In to see pt for treatment and her reports that he does not foresee any issue with BADLs, that he has all needed DME at home that he has used before and he has prn A at home. No further OT needs identified, will sign off.  Evette Georges [phone] 11/04/2013, 10:24 AM

## 2013-11-04 NOTE — Progress Notes (Signed)
Patient ID: Bobby White, male   DOB: 09-Jun-1961, 52 y.o.   MRN: [id number] PATIENT ID: Bobby White  MRN: [id number]  DOB/AGE:  26-Sep-1961 / 52 y.o.  3 Days Post-Op Procedure(s) (LRB): TOTAL KNEE ARTHROPLASTY- LEFT (Left)    PROGRESS NOTE Subjective: Patient is alert, oriented, no Nausea, no Vomiting, yes passing gas, yes Bowel Movement. Taking PO well. Denies SOB, Chest or Calf Pain. Using Incentive Spirometer, PAS in place. Ambulate WBAT 45 ft yesterday, CPM 0-70 Patient reports pain as 5 on 0-10 scale  .    Objective: Vital signs in last 24 hours: Filed Vitals:   11/03/13 1600 11/03/13 2139 11/04/13 0200 11/04/13 0510  BP:  118/71  129/72  Pulse:  90  86  Temp:  99.4 F (37.4 C) 98.2 F (36.8 C) 99.2 F (37.3 C)  TempSrc:  Oral Oral Oral  Resp: 18 18  18   Height:      Weight:      SpO2: 99% 97%  98%      Intake/Output from previous day: I/O last 3 completed shifts: In: 1760 [P.O.:1760] Out: 3800 [Urine:3800]   Intake/Output this shift:     LABORATORY DATA:  Recent Labs  11/02/13 0423 11/03/13 0335 11/04/13 0405  WBC 8.2 8.0 7.2  HGB 10.5* 9.9* 9.8*  HCT 33.4* 29.6* 29.6*  PLT 211 188 204  NA 135  --   --   K 3.9  --   --   CL 99  --   --   CO2 28  --   --   BUN 12  --   --   CREATININE 0.66  --   --   GLUCOSE 112*  --   --   CALCIUM 8.4  --   --     Examination: Neurologically intact ABD soft Neurovascular intact Sensation intact distally Intact pulses distally Dorsiflexion/Plantar flexion intact Incision: no drainage No cellulitis present Compartment soft}  Assessment:   3 Days Post-Op Procedure(s) (LRB): TOTAL KNEE ARTHROPLASTY- LEFT (Left) ADDITIONAL DIAGNOSIS:  Hypertension and hx CAD, GERD  Plan: PT/OT WBAT, CPM 5/hrs day until ROM 0-90 degrees, then D/C CPM DVT Prophylaxis:  SCDx72hrs, ASA 325 mg BID x 2 weeks DISCHARGE PLAN: Home, today after P.T. DISCHARGE NEEDS: HHPT, HHRN, CPM, Walker and 3-in-1 comode seat      Carmesha Morocco J 11/04/2013, 8:24 AM

## 2013-11-04 NOTE — Progress Notes (Signed)
Physical Therapy Treatment Patient Details Name: Bobby White MRN: [id number] DOB: [birth year] Today's Date: 11/04/2013 Time: 0800-0827 PT Time Calculation (min): 27 min  PT Assessment / Plan / Recommendation  History of Present Illness LTKA on 11/01/13.   PT Comments   Pt cont's to c/o severe pain in Lt knee however is moving at supervision level.     Follow Up Recommendations  Home health PT     Does the patient have the potential to tolerate intense rehabilitation     Barriers to Discharge        Equipment Recommendations  None recommended by PT    Recommendations for Other Services OT consult  Frequency 7X/week   Progress towards PT Goals Progress towards PT goals: Progressing toward goals  Plan Current plan remains appropriate    Precautions / Restrictions Precautions Precautions: Knee Restrictions Weight Bearing Restrictions: Yes LLE Weight Bearing: Weight bearing as tolerated   Pertinent Vitals/Pain 8-9/10 Lt knee.  Premedicated.  Repositioned for comfort.      Mobility  Bed Mobility Bed Mobility: Supine to Sit;Sitting - Scoot to Edge of Bed Supine to Sit: 6: Modified independent (Device/Increase time);HOB flat Sitting - Scoot to Edge of Bed: 6: Modified independent (Device/Increase time) Details for Bed Mobility Assistance: Cues for hooking technique with pt then not needing any physical (A), only increased time Transfers Transfers: Sit to Stand;Stand to Sit Sit to Stand: 5: Supervision;With upper extremity assist;With armrests;From bed;From chair/3-in-1 Stand to Sit: 5: Supervision;With upper extremity assist;With armrests;To chair/3-in-1 Details for Transfer Assistance: Slow due to pain but no physical (A) Ambulation/Gait Ambulation/Gait Assistance: 5: Supervision Ambulation Distance (Feet): 100 Feet Assistive device: Rolling walker Ambulation/Gait Assistance Details: Pt cont's to present with decreased WBing tolerance due to pain but has good Lt knee  stability during stance phase Gait Pattern: Step-to pattern;Step-through pattern;Decreased stride length;Decreased step length - right;Decreased weight shift to left;Antalgic Gait velocity: decreased General Gait Details: relies heavily on RW with UE's.   Stairs: Yes Stairs Assistance: 4: Min IT consultant Details (indicate cue type and reason): Pt demonstrated proper sequencing & technique; cont's to be effortful due to Lt knee pain Stair Management Technique: One rail Right;Step to pattern;Forwards Number of Stairs: 3 Wheelchair Mobility Wheelchair Mobility: No    Exercises Total Joint Exercises Ankle Circles/Pumps: AROM;Both;10 reps Quad Sets: AROM;Strengthening;Both;10 reps Heel Slides: AAROM;Strengthening;Left;10 reps;Limitations Heel Slides Limitations: limited by pain Hip ABduction/ADduction: AAROM;Strengthening;Left;10 reps Straight Leg Raises: AAROM;Strengthening;Left;10 reps     PT Goals (current goals can now be found in the care plan section) Acute Rehab PT Goals PT Goal Formulation: With patient Time For Goal Achievement: 11/08/13 Potential to Achieve Goals: Good  Visit Information  Last PT Received On: 11/04/13 Assistance Needed: +1 History of Present Illness: LTKA on 11/01/13.    Subjective Data      Cognition  Cognition Arousal/Alertness: Awake/alert Behavior During Therapy: WFL for tasks assessed/performed Overall Cognitive Status: Within Functional Limits for tasks assessed    Balance     End of Session PT - End of Session Activity Tolerance: Patient tolerated treatment well;Patient limited by pain Patient left: in chair;with call bell/phone within reach Nurse Communication: Mobility status   GP     Lara Mulch 11/04/2013, 9:56 AM  Verdell Face, PTA [phone] 11/04/2013

## 2013-11-04 NOTE — Progress Notes (Signed)
Patient discharged to home accompanied by wife. Discharge instructions and rx given and explained and patient stated understanding. Patients IV was removed and he left unit in a stable condition via wheelchair. 

## 2013-11-22 ENCOUNTER — Ambulatory Visit (INDEPENDENT_AMBULATORY_CARE_PROVIDER_SITE_OTHER): Payer: BC Managed Care – PPO | Admitting: Physical Therapy

## 2013-11-22 DIAGNOSIS — M6281 Muscle weakness (generalized): Secondary | ICD-10-CM

## 2013-11-22 DIAGNOSIS — R609 Edema, unspecified: Secondary | ICD-10-CM

## 2013-11-22 DIAGNOSIS — M1710 Unilateral primary osteoarthritis, unspecified knee: Secondary | ICD-10-CM

## 2013-11-22 DIAGNOSIS — M25669 Stiffness of unspecified knee, not elsewhere classified: Secondary | ICD-10-CM

## 2013-11-22 DIAGNOSIS — M25569 Pain in unspecified knee: Secondary | ICD-10-CM

## 2013-11-22 DIAGNOSIS — R269 Unspecified abnormalities of gait and mobility: Secondary | ICD-10-CM

## 2013-11-24 ENCOUNTER — Encounter (INDEPENDENT_AMBULATORY_CARE_PROVIDER_SITE_OTHER): Payer: BC Managed Care – PPO

## 2013-11-24 DIAGNOSIS — M25669 Stiffness of unspecified knee, not elsewhere classified: Secondary | ICD-10-CM

## 2013-11-24 DIAGNOSIS — M1710 Unilateral primary osteoarthritis, unspecified knee: Secondary | ICD-10-CM

## 2013-11-24 DIAGNOSIS — R269 Unspecified abnormalities of gait and mobility: Secondary | ICD-10-CM

## 2013-11-24 DIAGNOSIS — M6281 Muscle weakness (generalized): Secondary | ICD-10-CM

## 2013-11-24 DIAGNOSIS — R609 Edema, unspecified: Secondary | ICD-10-CM

## 2013-11-24 DIAGNOSIS — M25569 Pain in unspecified knee: Secondary | ICD-10-CM

## 2013-11-25 ENCOUNTER — Encounter (INDEPENDENT_AMBULATORY_CARE_PROVIDER_SITE_OTHER): Payer: BC Managed Care – PPO

## 2013-11-25 DIAGNOSIS — R609 Edema, unspecified: Secondary | ICD-10-CM

## 2013-11-25 DIAGNOSIS — M6281 Muscle weakness (generalized): Secondary | ICD-10-CM

## 2013-11-25 DIAGNOSIS — M25669 Stiffness of unspecified knee, not elsewhere classified: Secondary | ICD-10-CM

## 2013-11-25 DIAGNOSIS — M1710 Unilateral primary osteoarthritis, unspecified knee: Secondary | ICD-10-CM

## 2013-11-25 DIAGNOSIS — M25569 Pain in unspecified knee: Secondary | ICD-10-CM

## 2013-11-25 DIAGNOSIS — R269 Unspecified abnormalities of gait and mobility: Secondary | ICD-10-CM

## 2013-11-26 ENCOUNTER — Encounter: Payer: BC Managed Care – PPO | Admitting: Physical Therapy

## 2013-11-29 ENCOUNTER — Encounter (INDEPENDENT_AMBULATORY_CARE_PROVIDER_SITE_OTHER): Payer: BC Managed Care – PPO | Admitting: Physical Therapy

## 2013-11-29 DIAGNOSIS — R269 Unspecified abnormalities of gait and mobility: Secondary | ICD-10-CM

## 2013-11-29 DIAGNOSIS — M25569 Pain in unspecified knee: Secondary | ICD-10-CM

## 2013-11-29 DIAGNOSIS — M6281 Muscle weakness (generalized): Secondary | ICD-10-CM

## 2013-11-29 DIAGNOSIS — M1710 Unilateral primary osteoarthritis, unspecified knee: Secondary | ICD-10-CM

## 2013-11-29 DIAGNOSIS — M25669 Stiffness of unspecified knee, not elsewhere classified: Secondary | ICD-10-CM

## 2013-12-01 ENCOUNTER — Encounter (INDEPENDENT_AMBULATORY_CARE_PROVIDER_SITE_OTHER): Payer: BC Managed Care – PPO | Admitting: Physical Therapy

## 2013-12-01 DIAGNOSIS — R609 Edema, unspecified: Secondary | ICD-10-CM

## 2013-12-01 DIAGNOSIS — M25569 Pain in unspecified knee: Secondary | ICD-10-CM

## 2013-12-01 DIAGNOSIS — R269 Unspecified abnormalities of gait and mobility: Secondary | ICD-10-CM

## 2013-12-01 DIAGNOSIS — M1710 Unilateral primary osteoarthritis, unspecified knee: Secondary | ICD-10-CM

## 2013-12-01 DIAGNOSIS — M6281 Muscle weakness (generalized): Secondary | ICD-10-CM

## 2013-12-01 DIAGNOSIS — M25669 Stiffness of unspecified knee, not elsewhere classified: Secondary | ICD-10-CM

## 2013-12-03 ENCOUNTER — Encounter (INDEPENDENT_AMBULATORY_CARE_PROVIDER_SITE_OTHER): Payer: BC Managed Care – PPO | Admitting: Physical Therapy

## 2013-12-03 DIAGNOSIS — M25569 Pain in unspecified knee: Secondary | ICD-10-CM

## 2013-12-03 DIAGNOSIS — M6281 Muscle weakness (generalized): Secondary | ICD-10-CM

## 2013-12-03 DIAGNOSIS — M25669 Stiffness of unspecified knee, not elsewhere classified: Secondary | ICD-10-CM

## 2013-12-03 DIAGNOSIS — R269 Unspecified abnormalities of gait and mobility: Secondary | ICD-10-CM

## 2013-12-03 DIAGNOSIS — M1710 Unilateral primary osteoarthritis, unspecified knee: Secondary | ICD-10-CM

## 2013-12-03 DIAGNOSIS — R609 Edema, unspecified: Secondary | ICD-10-CM

## 2013-12-05 ENCOUNTER — Other Ambulatory Visit: Payer: Self-pay | Admitting: Family Medicine

## 2013-12-06 ENCOUNTER — Encounter (INDEPENDENT_AMBULATORY_CARE_PROVIDER_SITE_OTHER): Payer: BC Managed Care – PPO | Admitting: Physical Therapy

## 2013-12-06 ENCOUNTER — Other Ambulatory Visit: Payer: Self-pay | Admitting: *Deleted

## 2013-12-06 DIAGNOSIS — M6281 Muscle weakness (generalized): Secondary | ICD-10-CM

## 2013-12-06 DIAGNOSIS — R269 Unspecified abnormalities of gait and mobility: Secondary | ICD-10-CM

## 2013-12-06 DIAGNOSIS — M1710 Unilateral primary osteoarthritis, unspecified knee: Secondary | ICD-10-CM

## 2013-12-06 DIAGNOSIS — R609 Edema, unspecified: Secondary | ICD-10-CM

## 2013-12-06 DIAGNOSIS — M25569 Pain in unspecified knee: Secondary | ICD-10-CM

## 2013-12-06 DIAGNOSIS — M25669 Stiffness of unspecified knee, not elsewhere classified: Secondary | ICD-10-CM

## 2013-12-06 MED ORDER — MELOXICAM 15 MG PO TABS: 15.0000 mg | ORAL_TABLET | Freq: Every day | ORAL | Status: DC

## 2013-12-08 ENCOUNTER — Encounter (INDEPENDENT_AMBULATORY_CARE_PROVIDER_SITE_OTHER): Payer: BC Managed Care – PPO

## 2013-12-08 DIAGNOSIS — M25669 Stiffness of unspecified knee, not elsewhere classified: Secondary | ICD-10-CM

## 2013-12-08 DIAGNOSIS — M25569 Pain in unspecified knee: Secondary | ICD-10-CM

## 2013-12-08 DIAGNOSIS — M1710 Unilateral primary osteoarthritis, unspecified knee: Secondary | ICD-10-CM

## 2013-12-08 DIAGNOSIS — R269 Unspecified abnormalities of gait and mobility: Secondary | ICD-10-CM

## 2013-12-08 DIAGNOSIS — R609 Edema, unspecified: Secondary | ICD-10-CM

## 2013-12-08 DIAGNOSIS — M6281 Muscle weakness (generalized): Secondary | ICD-10-CM

## 2013-12-10 ENCOUNTER — Encounter (INDEPENDENT_AMBULATORY_CARE_PROVIDER_SITE_OTHER): Payer: BC Managed Care – PPO | Admitting: Physical Therapy

## 2013-12-10 DIAGNOSIS — M25669 Stiffness of unspecified knee, not elsewhere classified: Secondary | ICD-10-CM

## 2013-12-10 DIAGNOSIS — R269 Unspecified abnormalities of gait and mobility: Secondary | ICD-10-CM

## 2013-12-10 DIAGNOSIS — M1710 Unilateral primary osteoarthritis, unspecified knee: Secondary | ICD-10-CM

## 2013-12-10 DIAGNOSIS — R609 Edema, unspecified: Secondary | ICD-10-CM

## 2013-12-10 DIAGNOSIS — M6281 Muscle weakness (generalized): Secondary | ICD-10-CM

## 2013-12-10 DIAGNOSIS — M25569 Pain in unspecified knee: Secondary | ICD-10-CM

## 2013-12-13 ENCOUNTER — Encounter (INDEPENDENT_AMBULATORY_CARE_PROVIDER_SITE_OTHER): Payer: BC Managed Care – PPO | Admitting: Physical Therapy

## 2013-12-13 DIAGNOSIS — M6281 Muscle weakness (generalized): Secondary | ICD-10-CM

## 2013-12-13 DIAGNOSIS — M25569 Pain in unspecified knee: Secondary | ICD-10-CM

## 2013-12-13 DIAGNOSIS — R609 Edema, unspecified: Secondary | ICD-10-CM

## 2013-12-13 DIAGNOSIS — R269 Unspecified abnormalities of gait and mobility: Secondary | ICD-10-CM

## 2013-12-13 DIAGNOSIS — M1710 Unilateral primary osteoarthritis, unspecified knee: Secondary | ICD-10-CM

## 2013-12-15 ENCOUNTER — Encounter (INDEPENDENT_AMBULATORY_CARE_PROVIDER_SITE_OTHER): Payer: BC Managed Care – PPO | Admitting: Physical Therapy

## 2013-12-15 DIAGNOSIS — M6281 Muscle weakness (generalized): Secondary | ICD-10-CM

## 2013-12-15 DIAGNOSIS — R269 Unspecified abnormalities of gait and mobility: Secondary | ICD-10-CM

## 2013-12-15 DIAGNOSIS — M25569 Pain in unspecified knee: Secondary | ICD-10-CM

## 2013-12-15 DIAGNOSIS — M1710 Unilateral primary osteoarthritis, unspecified knee: Secondary | ICD-10-CM

## 2013-12-15 DIAGNOSIS — M25669 Stiffness of unspecified knee, not elsewhere classified: Secondary | ICD-10-CM

## 2013-12-15 DIAGNOSIS — R609 Edema, unspecified: Secondary | ICD-10-CM

## 2013-12-17 ENCOUNTER — Encounter (INDEPENDENT_AMBULATORY_CARE_PROVIDER_SITE_OTHER): Payer: BC Managed Care – PPO | Admitting: Physical Therapy

## 2013-12-17 DIAGNOSIS — R269 Unspecified abnormalities of gait and mobility: Secondary | ICD-10-CM

## 2013-12-17 DIAGNOSIS — M6281 Muscle weakness (generalized): Secondary | ICD-10-CM

## 2013-12-17 DIAGNOSIS — R609 Edema, unspecified: Secondary | ICD-10-CM

## 2013-12-17 DIAGNOSIS — M1710 Unilateral primary osteoarthritis, unspecified knee: Secondary | ICD-10-CM

## 2013-12-17 DIAGNOSIS — M25569 Pain in unspecified knee: Secondary | ICD-10-CM

## 2013-12-17 DIAGNOSIS — M25669 Stiffness of unspecified knee, not elsewhere classified: Secondary | ICD-10-CM

## 2013-12-20 ENCOUNTER — Encounter (INDEPENDENT_AMBULATORY_CARE_PROVIDER_SITE_OTHER): Payer: BC Managed Care – PPO | Admitting: Physical Therapy

## 2013-12-20 DIAGNOSIS — M25569 Pain in unspecified knee: Secondary | ICD-10-CM

## 2013-12-20 DIAGNOSIS — M1710 Unilateral primary osteoarthritis, unspecified knee: Secondary | ICD-10-CM

## 2013-12-20 DIAGNOSIS — R269 Unspecified abnormalities of gait and mobility: Secondary | ICD-10-CM

## 2013-12-20 DIAGNOSIS — R609 Edema, unspecified: Secondary | ICD-10-CM

## 2013-12-20 DIAGNOSIS — M25669 Stiffness of unspecified knee, not elsewhere classified: Secondary | ICD-10-CM

## 2013-12-20 DIAGNOSIS — M6281 Muscle weakness (generalized): Secondary | ICD-10-CM

## 2013-12-22 ENCOUNTER — Encounter (INDEPENDENT_AMBULATORY_CARE_PROVIDER_SITE_OTHER): Payer: BC Managed Care – PPO | Admitting: Physical Therapy

## 2013-12-22 DIAGNOSIS — R609 Edema, unspecified: Secondary | ICD-10-CM

## 2013-12-22 DIAGNOSIS — M6281 Muscle weakness (generalized): Secondary | ICD-10-CM

## 2013-12-22 DIAGNOSIS — M25669 Stiffness of unspecified knee, not elsewhere classified: Secondary | ICD-10-CM

## 2013-12-22 DIAGNOSIS — M25569 Pain in unspecified knee: Secondary | ICD-10-CM

## 2013-12-22 DIAGNOSIS — M1710 Unilateral primary osteoarthritis, unspecified knee: Secondary | ICD-10-CM

## 2013-12-22 DIAGNOSIS — R269 Unspecified abnormalities of gait and mobility: Secondary | ICD-10-CM

## 2013-12-23 ENCOUNTER — Encounter (INDEPENDENT_AMBULATORY_CARE_PROVIDER_SITE_OTHER): Payer: BC Managed Care – PPO

## 2013-12-23 DIAGNOSIS — M25569 Pain in unspecified knee: Secondary | ICD-10-CM

## 2013-12-23 DIAGNOSIS — R269 Unspecified abnormalities of gait and mobility: Secondary | ICD-10-CM

## 2013-12-23 DIAGNOSIS — M1710 Unilateral primary osteoarthritis, unspecified knee: Secondary | ICD-10-CM

## 2013-12-23 DIAGNOSIS — R609 Edema, unspecified: Secondary | ICD-10-CM

## 2013-12-23 DIAGNOSIS — M6281 Muscle weakness (generalized): Secondary | ICD-10-CM

## 2013-12-23 DIAGNOSIS — M25669 Stiffness of unspecified knee, not elsewhere classified: Secondary | ICD-10-CM

## 2013-12-27 ENCOUNTER — Encounter (INDEPENDENT_AMBULATORY_CARE_PROVIDER_SITE_OTHER): Payer: BC Managed Care – PPO | Admitting: Physical Therapy

## 2013-12-27 DIAGNOSIS — M1710 Unilateral primary osteoarthritis, unspecified knee: Secondary | ICD-10-CM

## 2013-12-27 DIAGNOSIS — M25669 Stiffness of unspecified knee, not elsewhere classified: Secondary | ICD-10-CM

## 2013-12-27 DIAGNOSIS — R609 Edema, unspecified: Secondary | ICD-10-CM

## 2013-12-27 DIAGNOSIS — M25569 Pain in unspecified knee: Secondary | ICD-10-CM

## 2013-12-27 DIAGNOSIS — M6281 Muscle weakness (generalized): Secondary | ICD-10-CM

## 2013-12-27 DIAGNOSIS — R269 Unspecified abnormalities of gait and mobility: Secondary | ICD-10-CM

## 2013-12-29 ENCOUNTER — Encounter (INDEPENDENT_AMBULATORY_CARE_PROVIDER_SITE_OTHER): Payer: BC Managed Care – PPO | Admitting: Physical Therapy

## 2013-12-29 DIAGNOSIS — M25569 Pain in unspecified knee: Secondary | ICD-10-CM

## 2013-12-29 DIAGNOSIS — R609 Edema, unspecified: Secondary | ICD-10-CM

## 2013-12-29 DIAGNOSIS — M25669 Stiffness of unspecified knee, not elsewhere classified: Secondary | ICD-10-CM

## 2013-12-29 DIAGNOSIS — M6281 Muscle weakness (generalized): Secondary | ICD-10-CM

## 2013-12-29 DIAGNOSIS — R269 Unspecified abnormalities of gait and mobility: Secondary | ICD-10-CM

## 2013-12-31 ENCOUNTER — Encounter (INDEPENDENT_AMBULATORY_CARE_PROVIDER_SITE_OTHER): Payer: BC Managed Care – PPO | Admitting: Physical Therapy

## 2013-12-31 DIAGNOSIS — M1710 Unilateral primary osteoarthritis, unspecified knee: Secondary | ICD-10-CM

## 2013-12-31 DIAGNOSIS — M25569 Pain in unspecified knee: Secondary | ICD-10-CM

## 2013-12-31 DIAGNOSIS — M25669 Stiffness of unspecified knee, not elsewhere classified: Secondary | ICD-10-CM

## 2013-12-31 DIAGNOSIS — M6281 Muscle weakness (generalized): Secondary | ICD-10-CM

## 2013-12-31 DIAGNOSIS — R269 Unspecified abnormalities of gait and mobility: Secondary | ICD-10-CM

## 2013-12-31 DIAGNOSIS — R609 Edema, unspecified: Secondary | ICD-10-CM

## 2014-01-03 ENCOUNTER — Encounter (INDEPENDENT_AMBULATORY_CARE_PROVIDER_SITE_OTHER): Payer: BC Managed Care – PPO | Admitting: Physical Therapy

## 2014-01-03 DIAGNOSIS — M25569 Pain in unspecified knee: Secondary | ICD-10-CM

## 2014-01-03 DIAGNOSIS — M6281 Muscle weakness (generalized): Secondary | ICD-10-CM

## 2014-01-03 DIAGNOSIS — M1710 Unilateral primary osteoarthritis, unspecified knee: Secondary | ICD-10-CM

## 2014-01-03 DIAGNOSIS — R609 Edema, unspecified: Secondary | ICD-10-CM

## 2014-01-03 DIAGNOSIS — M25669 Stiffness of unspecified knee, not elsewhere classified: Secondary | ICD-10-CM

## 2014-01-03 DIAGNOSIS — R269 Unspecified abnormalities of gait and mobility: Secondary | ICD-10-CM

## 2014-01-05 ENCOUNTER — Encounter (INDEPENDENT_AMBULATORY_CARE_PROVIDER_SITE_OTHER): Payer: BC Managed Care – PPO | Admitting: Physical Therapy

## 2014-01-05 DIAGNOSIS — M25569 Pain in unspecified knee: Secondary | ICD-10-CM

## 2014-01-05 DIAGNOSIS — M6281 Muscle weakness (generalized): Secondary | ICD-10-CM

## 2014-01-05 DIAGNOSIS — R609 Edema, unspecified: Secondary | ICD-10-CM

## 2014-01-05 DIAGNOSIS — M1710 Unilateral primary osteoarthritis, unspecified knee: Secondary | ICD-10-CM

## 2014-01-05 DIAGNOSIS — R269 Unspecified abnormalities of gait and mobility: Secondary | ICD-10-CM

## 2014-01-05 DIAGNOSIS — M25669 Stiffness of unspecified knee, not elsewhere classified: Secondary | ICD-10-CM

## 2014-01-07 ENCOUNTER — Encounter (INDEPENDENT_AMBULATORY_CARE_PROVIDER_SITE_OTHER): Payer: BC Managed Care – PPO | Admitting: Physical Therapy

## 2014-01-07 DIAGNOSIS — M25669 Stiffness of unspecified knee, not elsewhere classified: Secondary | ICD-10-CM

## 2014-01-07 DIAGNOSIS — M6281 Muscle weakness (generalized): Secondary | ICD-10-CM

## 2014-01-07 DIAGNOSIS — R269 Unspecified abnormalities of gait and mobility: Secondary | ICD-10-CM

## 2014-01-07 DIAGNOSIS — M1710 Unilateral primary osteoarthritis, unspecified knee: Secondary | ICD-10-CM

## 2014-01-07 DIAGNOSIS — R609 Edema, unspecified: Secondary | ICD-10-CM

## 2014-01-07 DIAGNOSIS — M25569 Pain in unspecified knee: Secondary | ICD-10-CM

## 2014-01-10 ENCOUNTER — Encounter (INDEPENDENT_AMBULATORY_CARE_PROVIDER_SITE_OTHER): Payer: BC Managed Care – PPO | Admitting: Physical Therapy

## 2014-01-10 DIAGNOSIS — R269 Unspecified abnormalities of gait and mobility: Secondary | ICD-10-CM

## 2014-01-10 DIAGNOSIS — M25669 Stiffness of unspecified knee, not elsewhere classified: Secondary | ICD-10-CM

## 2014-01-10 DIAGNOSIS — M25569 Pain in unspecified knee: Secondary | ICD-10-CM

## 2014-01-10 DIAGNOSIS — M6281 Muscle weakness (generalized): Secondary | ICD-10-CM

## 2014-01-10 DIAGNOSIS — M1710 Unilateral primary osteoarthritis, unspecified knee: Secondary | ICD-10-CM

## 2014-01-10 DIAGNOSIS — R609 Edema, unspecified: Secondary | ICD-10-CM

## 2014-01-11 ENCOUNTER — Encounter (HOSPITAL_COMMUNITY): Payer: Self-pay | Admitting: Orthopedic Surgery

## 2014-01-12 ENCOUNTER — Encounter (INDEPENDENT_AMBULATORY_CARE_PROVIDER_SITE_OTHER): Payer: BC Managed Care – PPO | Admitting: Physical Therapy

## 2014-01-12 DIAGNOSIS — M25569 Pain in unspecified knee: Secondary | ICD-10-CM

## 2014-01-12 DIAGNOSIS — R609 Edema, unspecified: Secondary | ICD-10-CM

## 2014-01-12 DIAGNOSIS — M25669 Stiffness of unspecified knee, not elsewhere classified: Secondary | ICD-10-CM

## 2014-01-12 DIAGNOSIS — M6281 Muscle weakness (generalized): Secondary | ICD-10-CM

## 2014-01-12 DIAGNOSIS — R269 Unspecified abnormalities of gait and mobility: Secondary | ICD-10-CM

## 2014-01-12 DIAGNOSIS — M1710 Unilateral primary osteoarthritis, unspecified knee: Secondary | ICD-10-CM

## 2014-01-14 ENCOUNTER — Encounter (INDEPENDENT_AMBULATORY_CARE_PROVIDER_SITE_OTHER): Payer: BC Managed Care – PPO | Admitting: Physical Therapy

## 2014-01-14 DIAGNOSIS — M6281 Muscle weakness (generalized): Secondary | ICD-10-CM

## 2014-01-14 DIAGNOSIS — M25669 Stiffness of unspecified knee, not elsewhere classified: Secondary | ICD-10-CM

## 2014-01-14 DIAGNOSIS — M1710 Unilateral primary osteoarthritis, unspecified knee: Secondary | ICD-10-CM

## 2014-01-14 DIAGNOSIS — M25569 Pain in unspecified knee: Secondary | ICD-10-CM

## 2014-01-14 DIAGNOSIS — R269 Unspecified abnormalities of gait and mobility: Secondary | ICD-10-CM

## 2014-01-14 DIAGNOSIS — R609 Edema, unspecified: Secondary | ICD-10-CM

## 2014-01-14 DIAGNOSIS — M2550 Pain in unspecified joint: Secondary | ICD-10-CM

## 2014-01-17 ENCOUNTER — Encounter (INDEPENDENT_AMBULATORY_CARE_PROVIDER_SITE_OTHER): Payer: BC Managed Care – PPO | Admitting: Physical Therapy

## 2014-01-17 DIAGNOSIS — R269 Unspecified abnormalities of gait and mobility: Secondary | ICD-10-CM

## 2014-01-17 DIAGNOSIS — M6281 Muscle weakness (generalized): Secondary | ICD-10-CM

## 2014-01-17 DIAGNOSIS — M25569 Pain in unspecified knee: Secondary | ICD-10-CM

## 2014-01-17 DIAGNOSIS — R609 Edema, unspecified: Secondary | ICD-10-CM

## 2014-01-17 DIAGNOSIS — M1710 Unilateral primary osteoarthritis, unspecified knee: Secondary | ICD-10-CM

## 2014-01-18 ENCOUNTER — Encounter: Payer: BC Managed Care – PPO | Admitting: Physical Therapy

## 2014-01-19 ENCOUNTER — Encounter (INDEPENDENT_AMBULATORY_CARE_PROVIDER_SITE_OTHER): Payer: BC Managed Care – PPO | Admitting: Physical Therapy

## 2014-01-19 DIAGNOSIS — M25669 Stiffness of unspecified knee, not elsewhere classified: Secondary | ICD-10-CM

## 2014-01-19 DIAGNOSIS — R269 Unspecified abnormalities of gait and mobility: Secondary | ICD-10-CM

## 2014-01-19 DIAGNOSIS — M25569 Pain in unspecified knee: Secondary | ICD-10-CM

## 2014-01-19 DIAGNOSIS — R609 Edema, unspecified: Secondary | ICD-10-CM

## 2014-01-19 DIAGNOSIS — M6281 Muscle weakness (generalized): Secondary | ICD-10-CM

## 2014-01-19 DIAGNOSIS — M1710 Unilateral primary osteoarthritis, unspecified knee: Secondary | ICD-10-CM

## 2014-03-04 ENCOUNTER — Other Ambulatory Visit: Payer: Self-pay | Admitting: Family Medicine

## 2014-04-10 LAB — BASIC METABOLIC PANEL
Creatinine: 0.8 mg/dL (ref 0.6–1.3)
POTASSIUM: 5 mmol/L (ref 3.4–5.3)
SODIUM: 143 mmol/L (ref 137–147)

## 2014-04-10 LAB — CBC AND DIFFERENTIAL
Hemoglobin: 12.1 g/dL — AB (ref 13.5–17.5)
Platelets: 210 10*3/uL (ref 150–399)

## 2014-04-10 LAB — HEPATIC FUNCTION PANEL: ALT: 22 U/L (ref 10–40)

## 2014-04-13 ENCOUNTER — Encounter: Payer: Self-pay | Admitting: *Deleted

## 2014-04-19 ENCOUNTER — Encounter: Payer: Self-pay | Admitting: Family Medicine

## 2014-04-19 ENCOUNTER — Ambulatory Visit (INDEPENDENT_AMBULATORY_CARE_PROVIDER_SITE_OTHER): Payer: BC Managed Care – PPO | Admitting: Family Medicine

## 2014-04-19 VITALS — BP 116/73 | HR 69 | Wt 288.0 lb

## 2014-04-19 DIAGNOSIS — L0390 Cellulitis, unspecified: Secondary | ICD-10-CM

## 2014-04-19 DIAGNOSIS — L255 Unspecified contact dermatitis due to plants, except food: Secondary | ICD-10-CM

## 2014-04-19 DIAGNOSIS — S81801A Unspecified open wound, right lower leg, initial encounter: Secondary | ICD-10-CM

## 2014-04-19 DIAGNOSIS — R7301 Impaired fasting glucose: Secondary | ICD-10-CM

## 2014-04-19 DIAGNOSIS — L0291 Cutaneous abscess, unspecified: Secondary | ICD-10-CM

## 2014-04-19 DIAGNOSIS — R21 Rash and other nonspecific skin eruption: Secondary | ICD-10-CM

## 2014-04-19 DIAGNOSIS — L237 Allergic contact dermatitis due to plants, except food: Secondary | ICD-10-CM

## 2014-04-19 DIAGNOSIS — D509 Iron deficiency anemia, unspecified: Secondary | ICD-10-CM

## 2014-04-19 DIAGNOSIS — I10 Essential (primary) hypertension: Secondary | ICD-10-CM

## 2014-04-19 LAB — POCT GLYCOSYLATED HEMOGLOBIN (HGB A1C): Hemoglobin A1C: 5.4

## 2014-04-19 MED ORDER — SULFAMETHOXAZOLE-TMP DS 800-160 MG PO TABS: 1.0000 | ORAL_TABLET | Freq: Two times a day (BID) | ORAL | Status: DC

## 2014-04-19 MED ORDER — TRIAMCINOLONE ACETONIDE 40 MG/ML IJ SUSP
40.0000 mg | Freq: Once | INTRAMUSCULAR | Status: AC
  Administered 2014-04-19: 40 mg via INTRAMUSCULAR
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
## 2014-04-19 NOTE — Patient Instructions (Signed)
Call if the poison ivy rash starts to recur within 5-7 days. Can continue topical symptomatic treatment if needed. Keep an eye on the wound on your right lower leg. If it's not looking like it's starting to respond to the antibiotics by Thursday or Friday of this week and please call me. If he starts to experience any fevers chills or sweats and please call me immediately. Cleaned the wound in the shower with regular soap and water. pat dry. Do not use any alcohol or peroxide products. Apply a little bit of the yellow gauze and then applied a nonstick white gauze on top. Can wrap with a little bit flexible ACE wrap.

## 2014-04-20 LAB — FERRITIN: Ferritin: 15 ng/mL — ABNORMAL LOW (ref 22–322)

## 2014-04-22 ENCOUNTER — Ambulatory Visit (INDEPENDENT_AMBULATORY_CARE_PROVIDER_SITE_OTHER): Payer: BC Managed Care – PPO | Admitting: Family Medicine

## 2014-04-22 ENCOUNTER — Encounter: Payer: Self-pay | Admitting: Family Medicine

## 2014-04-22 VITALS — BP 114/65 | HR 68 | Wt 281.0 lb

## 2014-04-22 DIAGNOSIS — L255 Unspecified contact dermatitis due to plants, except food: Secondary | ICD-10-CM

## 2014-04-22 MED ORDER — PREDNISONE 20 MG PO TABS: ORAL_TABLET | ORAL | Status: AC

## 2014-04-22 NOTE — Progress Notes (Signed)
CC: Bobby White is a 53 y.o. male is here for [employer]   Subjective: HPI:  Complains of worsening rash localized on the forearms, neck, shins that has been present for approximately 4-5 days. Worsen on a daily basis but did have some temporary stalling of worsening after getting a steroid injection earlier this week. Rash is described as itching and moderate to severe in severity. Interventions also include calamine lotion without any benefit. He denies any fevers, chills, shortness of breath, rapid heartbeat, flushing. Symptoms are persistent throughout the day. Other than above nothing else that's better or worse   Review Of Systems Outlined In HPI  Past Medical History  Diagnosis Date  . Obesity   . Impaired fasting glucose 2009  . Varicose vein   . Hypertension 1/10  . GERD (gastroesophageal reflux disease)   . Arthritis   . Hepatitis     Hep C --had to get shots  . Sleep apnea   . Coronary artery disease     Past Surgical History  Procedure Laterality Date  . Pins in right hand  1990's    4th and 5th digits-- removed 6 weeks afterwards  . 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 Cardiac catheterization      2012  . 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 Total knee arthroplasty Right 09/06/2013    Procedure: TOTAL KNEE ARTHROPLASTY;  Surgeon: Kerin Salen, MD;  Location: [place];  Service: Orthopedics;  Laterality: Right;  . Total knee arthroplasty Left 11/01/2013    DR Mayer Camel  . Total knee arthroplasty Left 11/01/2013    Procedure: TOTAL KNEE ARTHROPLASTY- LEFT;  Surgeon: Kerin Salen, MD;  Location: [place];  Service: Orthopedics;  Laterality: Left;   Family History  Problem Relation Age of Onset  . Diabetes Mother   . Cancer Father     pancreatic    History   Social History  . Marital Status: Married    Spouse Name: N/A    Number of Children: N/A  . Years of Education: N/A   Occupational History  . Not on file.   Social  History Main Topics  . Smoking status: Former Smoker -- 1.50 packs/day for 15 years    Types: Cigarettes    Quit date: 11/18/1996  . Smokeless tobacco: Never Used  . Alcohol Use: Yes     Comment: socially  . Drug Use: Yes    Special: Marijuana     Comment: last time 2012 after his heart surgery  . Sexual Activity: Not on file   Other Topics Concern  . Not on file   Social History Narrative  . No narrative on file     Objective: BP 114/65  Pulse 68  Wt 281 lb (127.461 kg)  General: Alert and Oriented, No Acute Distress Lungs: Clear to auscultation bilaterally, no wheezing/ronchi/rales.  Comfortable work of breathing. Good air movement. Cardiac: Regular rate and rhythm. Normal S1/S2.  No murmurs, rubs, nor gallops.   Extremities: No peripheral edema.  Strong peripheral pulses.  Mental Status: No depression, anxiety, nor agitation. Skin: Warm and dry. Linear streaks and clusters of straw colored fluid vesicles surrounded by a moderate erythema localized on both forearms, elbows, anterior neck and anterior torso. Similar appearance on both shins right greater than left with a half centimeter shallow ulceration without signs of infection on the right shin.  Assessment & Plan: [place] was seen today  for poison ivy.  Diagnoses and associated orders for this visit:  Rhus dermatitis - predniSONE (DELTASONE) 20 MG tablet; Three tabs daily days 1-3, two tabs daily days 4-6, one tab daily days 7-9, half tab daily days 10-13.    Discussed over-the-counter antihistamines to help with itch, start prednisone taper, he is started taking Bactrim for the right shin which appears to be appropriate and have encouraged him to continue a full course of this. Continue covering with Xeroform to be changed on a daily basis.   Return if symptoms worsen or fail to improve.

## 2014-04-22 NOTE — Patient Instructions (Signed)
Poison Ivy Poison ivy is a inflammation of the skin (contact dermatitis) caused by touching the allergens on the leaves of the ivy plant following previous exposure to the plant. The rash usually appears 48 hours after exposure. The rash is usually bumps (papules) or blisters (vesicles) in a linear pattern. Depending on your own sensitivity, the rash may simply cause redness and itching, or it may also progress to blisters which may break open. These must be well cared for to prevent secondary bacterial (germ) infection, followed by scarring. Keep any open areas dry, clean, dressed, and covered with an antibacterial ointment if needed. The eyes may also get puffy. The puffiness is worst in the morning and gets better as the day progresses. This dermatitis usually heals without scarring, within 2 to 3 weeks without treatment. HOME CARE INSTRUCTIONS  Thoroughly wash with soap and water as soon as you have been exposed to poison ivy. You have about one half hour to remove the plant resin before it will cause the rash. This washing will destroy the oil or antigen on the skin that is causing, or will cause, the rash. Be sure to wash under your fingernails as any plant resin there will continue to spread the rash. Do not rub skin vigorously when washing affected area. Poison ivy cannot spread if no oil from the plant remains on your body. A rash that has progressed to weeping sores will not spread the rash unless you have not washed thoroughly. It is also important to wash any clothes you have been wearing as these may carry active allergens. The rash will return if you wear the unwashed clothing, even several days later. Avoidance of the plant in the future is the best measure. Poison ivy plant can be recognized by the number of leaves. Generally, poison ivy has three leaves with flowering branches on a single stem. Diphenhydramine may be purchased over the counter and used as needed for itching. Do not drive with  this medication if it makes you drowsy.Ask your caregiver about medication for children. SEEK MEDICAL CARE IF:  Open sores develop.  Redness spreads beyond area of rash.  You notice purulent (pus-like) discharge.  You have increased pain.  Other signs of infection develop (such as fever). Document Released: 11/01/2000 Document Revised: 01/27/2012 Document Reviewed: 09/20/2009 ExitCare Patient Information 2014 [employer].  

## 2014-04-26 ENCOUNTER — Ambulatory Visit: Payer: BC Managed Care – PPO | Admitting: Family Medicine

## 2014-05-12 ENCOUNTER — Other Ambulatory Visit: Payer: Self-pay | Admitting: Family Medicine

## 2014-05-23 ENCOUNTER — Ambulatory Visit (INDEPENDENT_AMBULATORY_CARE_PROVIDER_SITE_OTHER): Payer: BC Managed Care – PPO | Admitting: Physician Assistant

## 2014-05-23 ENCOUNTER — Encounter: Payer: Self-pay | Admitting: Physician Assistant

## 2014-05-23 VITALS — BP 127/80 | HR 77 | Temp 98.0°F | Ht 71.0 in | Wt 280.0 lb

## 2014-05-23 DIAGNOSIS — J0100 Acute maxillary sinusitis, unspecified: Secondary | ICD-10-CM

## 2014-05-23 DIAGNOSIS — R062 Wheezing: Secondary | ICD-10-CM

## 2014-05-23 DIAGNOSIS — J209 Acute bronchitis, unspecified: Secondary | ICD-10-CM

## 2014-05-23 MED ORDER — PREDNISONE 50 MG PO TABS: ORAL_TABLET | ORAL | Status: DC

## 2014-05-23 MED ORDER — AZITHROMYCIN 250 MG PO TABS: ORAL_TABLET | ORAL | Status: DC

## 2014-05-23 MED ORDER — ALBUTEROL SULFATE (2.5 MG/3ML) 0.083% IN NEBU
2.5000 mg | INHALATION_SOLUTION | Freq: Once | RESPIRATORY_TRACT | Status: AC
  Administered 2014-05-23: 2.5 mg via RESPIRATORY_TRACT

## 2014-05-23 NOTE — Patient Instructions (Signed)
Acute Bronchitis °Bronchitis is inflammation of the airways that extend from the windpipe into the lungs (bronchi). The inflammation often causes mucus to develop. This leads to a cough, which is the most common symptom of bronchitis.  °In acute bronchitis, the condition usually develops suddenly and goes away over time, usually in a couple weeks. Smoking, allergies, and asthma can make bronchitis worse. Repeated episodes of bronchitis may cause further lung problems.  °CAUSES °Acute bronchitis is most often caused by the same virus that causes a cold. The virus can spread from person to person (contagious).  °SIGNS AND SYMPTOMS  °· Cough.   °· Fever.   °· Coughing up mucus.   °· Body aches.   °· Chest congestion.   °· Chills.   °· Shortness of breath.   °· Sore throat.   °DIAGNOSIS  °Acute bronchitis is usually diagnosed through a physical exam. Tests, such as chest X-rays, are sometimes done to rule out other conditions.  °TREATMENT  °Acute bronchitis usually goes away in a couple weeks. Often times, no medical treatment is necessary. Medicines are sometimes given for relief of fever or cough. Antibiotics are usually not needed but may be prescribed in certain situations. In some cases, an inhaler may be recommended to help reduce shortness of breath and control the cough. A cool mist vaporizer may also be used to help thin bronchial secretions and make it easier to clear the chest.  °HOME CARE INSTRUCTIONS °· Get plenty of rest.   °· Drink enough fluids to keep your urine clear or pale yellow (unless you have a medical condition that requires fluid restriction). Increasing fluids may help thin your secretions and will prevent dehydration.   °· Only take over-the-counter or prescription medicines as directed by your health care provider.   °· Avoid smoking and secondhand smoke. Exposure to cigarette smoke or irritating chemicals will make bronchitis worse. If you are a smoker, consider using nicotine gum or skin  patches to help control withdrawal symptoms. Quitting smoking will help your lungs heal faster.   °· Reduce the chances of another bout of acute bronchitis by washing your hands frequently, avoiding people with cold symptoms, and trying not to touch your hands to your mouth, nose, or eyes.   °· Follow up with your health care provider as directed.   °SEEK MEDICAL CARE IF: °Your symptoms do not improve after 1 week of treatment.  °SEEK IMMEDIATE MEDICAL CARE IF: °· You develop an increased fever or chills.   °· You have chest pain.   °· You have severe shortness of breath. °· You have bloody sputum.   °· You develop dehydration. °· You develop fainting. °· You develop repeated vomiting. °· You develop a severe headache. °MAKE SURE YOU:  °· Understand these instructions. °· Will watch your condition. °· Will get help right away if you are not doing well or get worse. °Document Released: 12/12/2004 Document Revised: 07/07/2013 Document Reviewed: 04/27/2013 °ExitCare® Patient Information ©2015 [employer]. This information is not intended to replace advice given to you by your health care provider. Make sure you discuss any questions you have with your health care provider. ° °

## 2014-05-24 NOTE — Progress Notes (Signed)
   Subjective:    Patient ID: Bobby White, male    DOB: [birth year], 53 y.o.   MRN: [id number]  HPI Pt is a 53 yo male, former smoker, who presents to the clinic with cough, chest tightness and sinus pressure. Symptoms been going on for a little over 2 weeks. Symptoms started patient thought he was getting a cold. They have certainly worsened over the last 2 weeks. He is trying Mucinex, Sudafed, Tylenol Cold and cough. He still remained short of breath. He had an albuterol inhaler at home but didn't think to use it. Patient has felt hot and cold checked his temperature. He has a tremendous amount of head and sinus pressure. He denies any sore throat or ear pain. His cough is productive with yellow to white thick sputum.   Review of Systems  All other systems reviewed and are negative.      Objective:   Physical Exam  Constitutional: He is oriented to person, place, and time. He appears well-developed and well-nourished.  HENT:  Head: Normocephalic and atraumatic.  Right Ear: External ear normal.  Left Ear: External ear normal.  Nose: Nose normal.  Mouth/Throat: Oropharynx is clear and moist. No oropharyngeal exudate.  TM certainly have some erythema but no blood or pus. Ossicles able to be viewed bilaterally.  Significant bilateral maxillary sinus tenderness to palpation.  Eyes: Conjunctivae are normal. Right eye exhibits no discharge. Left eye exhibits no discharge.  Neck: Normal range of motion. Neck supple.  Cardiovascular: Normal rate, regular rhythm and normal heart sounds.   Pulmonary/Chest: Effort normal.  Bilateral wheezing at both bases that improve significantly after albuterol nebulizer.  Lymphadenopathy:    He has no cervical adenopathy.  Neurological: He is alert and oriented to person, place, and time.  Skin: Skin is dry.  Psychiatric: He has a normal mood and affect. His behavior is normal.          Assessment & Plan:  Acute bronchitis/acute  sinusitis/wheezing- albuterol nebulizer was given in office today. Patient reported significant improvement in exam room as well as wheezing was cleared on auscultation. Prednisone was given for 5 days with zpak for 5 days. Patient was encouraged to use his albuterol inhaler if he had any significant coughing spells or felt like his chest was tight. Other symptomatic care was discussed. Improving in the next 24-48 hours please call office.

## 2014-05-28 ENCOUNTER — Encounter: Payer: Self-pay | Admitting: Emergency Medicine

## 2014-05-28 ENCOUNTER — Emergency Department (INDEPENDENT_AMBULATORY_CARE_PROVIDER_SITE_OTHER)
Admission: EM | Admit: 2014-05-28 | Discharge: 2014-05-28 | Disposition: A | Discharge status: Home / Self Care | Payer: BC Managed Care – PPO | Source: Home / Self Care | Attending: Family Medicine | Admitting: Family Medicine

## 2014-05-28 ENCOUNTER — Emergency Department (INDEPENDENT_AMBULATORY_CARE_PROVIDER_SITE_OTHER): Payer: BC Managed Care – PPO

## 2014-05-28 DIAGNOSIS — R05 Cough: Secondary | ICD-10-CM

## 2014-05-28 DIAGNOSIS — J209 Acute bronchitis, unspecified: Secondary | ICD-10-CM

## 2014-05-28 DIAGNOSIS — R059 Cough, unspecified: Secondary | ICD-10-CM

## 2014-05-28 MED ORDER — DOXYCYCLINE HYCLATE 100 MG PO CAPS: 100.0000 mg | ORAL_CAPSULE | Freq: Two times a day (BID) | ORAL | Status: DC

## 2014-05-28 MED ORDER — BENZONATATE 200 MG PO CAPS: 200.0000 mg | ORAL_CAPSULE | Freq: Every day | ORAL | Status: DC

## 2014-05-28 NOTE — ED Notes (Signed)
Patient seen 05-23-2014 and treated for URI with prednisone and z-pack which he has completed; not feeling better.

## 2014-05-28 NOTE — ED Provider Notes (Signed)
CSN: [id number]     Arrival date & time 05/28/14  1523 History   First MD Initiated Contact with Patient 05/28/14 1530     Chief Complaint  Patient presents with  . Cough  . Nasal Congestion      HPI Comments: Patient was treated for a URI with a Z-pack and prednisone about 3 weeks ago.  He now complains of persistent non-productive cough and pleuritic pain in his left lower back.  He has had shortness of breath and wheezing, improved with albuterol MDI, but has run out of albuterol  The history is provided by the patient.    Past Medical History  Diagnosis Date  . Obesity   . Impaired fasting glucose 2009  . Varicose vein   . Hypertension 1/10  . GERD (gastroesophageal reflux disease)   . Arthritis   . Hepatitis     Hep C --had to get shots  . Sleep apnea   . Coronary artery disease    Past Surgical History  Procedure Laterality Date  . Pins in right hand  1990's    4th and 5th digits-- removed 6 weeks afterwards  . 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 Cardiac catheterization      2012  . 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 Total knee arthroplasty Right 09/06/2013    Procedure: TOTAL KNEE ARTHROPLASTY;  Surgeon: Kerin Salen, MD;  Location: [place];  Service: Orthopedics;  Laterality: Right;  . Total knee arthroplasty Left 11/01/2013    DR Mayer Camel  . Total knee arthroplasty Left 11/01/2013    Procedure: TOTAL KNEE ARTHROPLASTY- LEFT;  Surgeon: Kerin Salen, MD;  Location: [place];  Service: Orthopedics;  Laterality: Left;   Family History  Problem Relation Age of Onset  . Diabetes Mother   . Cancer Father     pancreatic   History  Substance Use Topics  . Smoking status: Former Smoker -- 1.50 packs/day for 15 years    Types: Cigarettes    Quit date: 11/18/1996  . Smokeless tobacco: Never Used  . Alcohol Use: Yes     Comment: socially    Review of Systems + sore throat + cough No pleuritic pain + wheezing No nasal  congestion ? post-nasal drainage No sinus pain/pressure No itchy/red eyes No earache No hemoptysis + SOB No fever, + chills No nausea No vomiting No abdominal pain No diarrhea No urinary symptoms No skin rash + fatigue No myalgias No headache Used OTC meds without relief  Allergies  Ace inhibitors  Home Medications   Prior to Admission medications   Medication Sig Start Date End Date Taking? Authorizing Provider  albuterol (PROVENTIL HFA;VENTOLIN HFA) 108 (90 BASE) MCG/ACT inhaler Inhale 2 puffs into the lungs every 6 (six) hours as needed for wheezing.    Historical Provider, MD  amLODipine (NORVASC) 5 MG tablet Take 5 mg by mouth daily.    Historical Provider, MD  aspirin EC 325 MG tablet Take 1 tablet (325 mg total) by mouth 2 (two) times daily. 11/01/13   Leighton Parody, PA-C  benzonatate (TESSALON) 200 MG capsule Take 1 capsule (200 mg total) by mouth at bedtime. Take as needed for cough 05/28/14   Kandra Nicolas, MD  Cinnamon 500 MG capsule Take 500 mg by mouth 2 (two) times daily.    Historical Provider, MD  Cyanocobalamin (B-12 PO) Take by mouth.    Historical Provider, MD  doxycycline (VIBRAMYCIN) 100 MG capsule Take 1 capsule (100 mg total) by mouth 2 (two) times daily. 05/28/14   Kandra Nicolas, MD  ibuprofen (ADVIL,MOTRIN) 800 MG tablet Take 800 mg by mouth every 8 (eight) hours as needed (for pain).    Historical Provider, MD  KRILL OIL PO Take 1 capsule by mouth daily.    Historical Provider, MD  Lorcaserin HCl (BELVIQ) 10 MG TABS Take 10 mg by mouth. 04/08/14   Historical Provider, MD  magnesium gluconate (MAGONATE) 500 MG tablet Take 500 mg by mouth daily.     Historical Provider, MD  meloxicam (MOBIC) 15 MG tablet TAKE 1 TABLET DAILY 05/12/14   Hali Marry, MD  metoprolol succinate (TOPROL-XL) 25 MG 24 hr tablet Take 25 mg by mouth daily.      Historical Provider, MD  nitroGLYCERIN (NITROSTAT) 0.4 MG SL tablet Place 0.4 mg under the tongue every 5 (five)  minutes as needed for chest pain.  04/29/13 04/29/14  Historical Provider, MD  omeprazole (PRILOSEC) 20 MG capsule Take 20 mg by mouth daily.     Historical Provider, MD  simvastatin (ZOCOR) 40 MG tablet TAKE 1 TABLET AT BEDTIME 03/04/14   Hali Marry, MD  topiramate (TOPAMAX) 50 MG tablet Take 50 mg by mouth 2 (two) times daily with a meal.  06/08/13   Historical Provider, MD   BP 118/75  Pulse 54  Temp(Src) 98.2 F (36.8 C) (Oral)  Resp 16  SpO2 98% Physical Exam Nursing notes and Vital Signs reviewed. Appearance:  Patient appears healthy, stated age, and in no acute distress Eyes:  Pupils are equal, round, and reactive to light and accomodation.  Extraocular movement is intact.  Conjunctivae are not inflamed  Ears:  Canals normal.  Tympanic membranes normal.  Nose:  Mildly congested turbinates.  No sinus tenderness.    Pharynx:  Normal Neck:  Supple.  No adenopathy Lungs:  Clear to auscultation.  Breath sounds are equal.  Heart:  Regular rate and rhythm without murmurs, rubs, or gallops.  Abdomen:  Nontender without masses or hepatosplenomegaly.  Bowel sounds are present.  No CVA or flank tenderness.  Extremities:  No edema.  No calf tenderness Skin:  No rash present.   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  Mediastinal contours and pulmonary vascularity normal.  Lungs clear.  No pleural effusion or pneumothorax.  Bones unremarkable.  IMPRESSION: Minimal enlargement of cardiac silhouette post CABG.  No acute abnormalities.   Electronically Signed   By: Lavonia Dana M.D.   On: 05/28/2014 16:13     MDM   1. Acute bronchitis, unspecified organism    Begin doxycycline for atypical coverage.  Prescription written for Benzonatate  [facility]) to take at bedtime for night-time cough.  Take plain Mucinex (1200 mg guaifenesin) twice daily for cough and congestion.  May add Sudafed for sinus congestion.   Increase fluid intake, rest. May use Afrin nasal spray (or generic oxymetazoline) twice daily for about 5 days.  Also recommend using saline nasal spray several times daily and saline nasal irrigation (AYR is a common brand) Stop all antihistamines for now, and other non-prescription cough/cold preparations. Continue albuterol inhaler as needed. Followup  with Family Doctor if not improved in one week.     Kandra Nicolas, MD 06/02/14 [phone]

## 2014-05-28 NOTE — Discharge Instructions (Signed)
Take plain Mucinex (1200 mg guaifenesin) twice daily for cough and congestion.  May add Sudafed for sinus congestion.   Increase fluid intake, rest. May use Afrin nasal spray (or generic oxymetazoline) twice daily for about 5 days.  Also recommend using saline nasal spray several times daily and saline nasal irrigation (AYR is a common brand) Stop all antihistamines for now, and other non-prescription cough/cold preparations. Continue albuterol inhaler as needed.      Bronchitis Bronchitis is inflammation of the airways that extend from the windpipe into the lungs (bronchi). The inflammation often causes mucus to develop, which leads to a cough. If the inflammation becomes severe, it may cause shortness of breath. CAUSES  Bronchitis may be caused by:   Viral infections.   Bacteria.   Cigarette smoke.   Allergens, pollutants, and other irritants.  SIGNS AND SYMPTOMS  The most common symptom of bronchitis is a frequent cough that produces mucus. Other symptoms include:  Fever.   Body aches.   Chest congestion.   Chills.   Shortness of breath.   Sore throat.  DIAGNOSIS  Bronchitis is usually diagnosed through a medical history and physical exam. Tests, such as chest X-rays, are sometimes done to rule out other conditions.  TREATMENT  You may need to avoid contact with whatever caused the problem (smoking, for example). Medicines are sometimes needed. These may include:  Antibiotics. These may be prescribed if the condition is caused by bacteria.  Cough suppressants. These may be prescribed for relief of cough symptoms.   Inhaled medicines. These may be prescribed to help open your airways and make it easier for you to breathe.   Steroid medicines. These may be prescribed for those with recurrent (chronic) bronchitis. HOME CARE INSTRUCTIONS  Get plenty of rest.   Drink enough fluids to keep your urine clear or pale yellow (unless you have a medical condition  that requires fluid restriction). Increasing fluids may help thin your secretions and will prevent dehydration.   Only take over-the-counter or prescription medicines as directed by your health care provider.  Only take antibiotics as directed. Make sure you finish them even if you start to feel better.  Avoid secondhand smoke, irritating chemicals, and strong fumes. These will make bronchitis worse. If you are a smoker, quit smoking. Consider using nicotine gum or skin patches to help control withdrawal symptoms. Quitting smoking will help your lungs heal faster.   Put a cool-mist humidifier in your bedroom at night to moisten the air. This may help loosen mucus. Change the water in the humidifier daily. You can also run the hot water in your shower and sit in the bathroom with the door closed for 5-10 minutes.   Follow up with your health care provider as directed.   Wash your hands frequently to avoid catching bronchitis again or spreading an infection to others.  SEEK MEDICAL CARE IF: Your symptoms do not improve after 1 week of treatment.  SEEK IMMEDIATE MEDICAL CARE IF:  Your fever increases.  You have chills.   You have chest pain.   You have worsening shortness of breath.   You have bloody sputum.  You faint.  You have lightheadedness.  You have a severe headache.   You vomit repeatedly. MAKE SURE YOU:   Understand these instructions.  Will watch your condition.  Will get help right away if you are not doing well or get worse. Document Released: 11/04/2005 Document Revised: 08/25/2013 Document Reviewed: 06/29/2013 [facility] Patient Information 2015 [place], [place].  This information is not intended to replace advice given to you by your health care provider. Make sure you discuss any questions you have with your health care provider. ° °

## 2014-06-03 ENCOUNTER — Telehealth: Payer: Self-pay

## 2014-06-03 NOTE — ED Notes (Signed)
Left a message on voice mail asking how patient is feeling and advising to call back with any questions or concerns.  

## 2014-08-08 ENCOUNTER — Other Ambulatory Visit: Payer: Self-pay | Admitting: Family Medicine

## 2014-08-10 ENCOUNTER — Other Ambulatory Visit: Payer: Self-pay | Admitting: Family Medicine

## 2014-08-17 ENCOUNTER — Encounter: Payer: Self-pay | Admitting: Emergency Medicine

## 2014-08-17 ENCOUNTER — Emergency Department
Admission: EM | Admit: 2014-08-17 | Discharge: 2014-08-17 | Disposition: A | Discharge status: Home / Self Care | Payer: BC Managed Care – PPO | Source: Home / Self Care | Attending: Family Medicine | Admitting: Family Medicine

## 2014-08-17 DIAGNOSIS — L255 Unspecified contact dermatitis due to plants, except food: Secondary | ICD-10-CM

## 2014-08-17 DIAGNOSIS — L237 Allergic contact dermatitis due to plants, except food: Secondary | ICD-10-CM

## 2014-08-17 MED ORDER — METHYLPREDNISOLONE ACETATE 40 MG/ML IJ SUSP
40.0000 mg | Freq: Once | INTRAMUSCULAR | Status: AC
  Administered 2014-08-17: 40 mg via INTRAMUSCULAR

## 2014-08-17 MED ORDER — PREDNISONE 5 MG PO KIT: PACK | ORAL | Status: DC

## 2014-08-17 NOTE — Discharge Instructions (Signed)
Thank you for coming in today. Start taking the prednisone dose back tomorrow. Come back as needed  [place] oak is an inflammation of the skin (contact dermatitis). It is caused by contact with the allergens on the leaves of the oak (toxicodendron) plants. Depending on your sensitivity, the rash may consist simply of redness and itching, or it may also progress to blisters which may break open (rupture). These must be well cared for to prevent secondary germ (bacterial) infection as these infections can lead to scarring. The eyes may also get puffy. The puffiness is worst in the morning and gets better as the day progresses. Healing is best accomplished by keeping any open areas dry, clean, covered with a bandage, and covered with an antibacterial ointment if needed. Without secondary infection, this dermatitis usually heals without scarring within 2 to 3 weeks without treatment. HOME CARE INSTRUCTIONS When you have been exposed to poison oak, it is very important to thoroughly wash with soap and water as soon as the exposure has been discovered. You have about one half hour to remove the plant resin before it will cause the rash. This cleaning will quickly destroy the oil or antigen on the skin (the antigen is what causes the rash). Wash aggressively under the fingernails as any plant resin still there will continue to spread the rash. Do not rub skin vigorously when washing affected area. Poison oak cannot spread if no oil from the plant remains on your body. Rash that has progressed to weeping sores (lesions) will not spread the rash unless you have not washed thoroughly. It is also important to clean any clothes you have been wearing as they may carry active allergens which will spread the rash, even several days later. Avoidance of the plant in the future is the best measure. Poison oak plants can be recognized by the number of leaves. Generally, poison oak has three leaves with flowering  branches on a single stem. Diphenhydramine may be purchased over the counter and used as needed for itching. Do not drive with this medication if it makes you drowsy. Ask your caregiver about medication for children. SEEK IMMEDIATE MEDICAL CARE IF:   Open areas of the rash develop.  You notice redness extending beyond the area of the rash.  There is a pus like discharge.  There is increased pain.  Other signs of infection develop (such as fever). Document Released: 05/11/2003 Document Revised: 01/27/2012 Document Reviewed: 09/20/2009 [facility] Patient Information 2015 [place], [place]. This information is not intended to replace advice given to you by your health care provider. Make sure you discuss any questions you have with your health care provider.

## 2014-08-17 NOTE — ED Notes (Signed)
Pt c/o rash on his RT arm, leg and RT side of his abd x 1wk. He has tried OTC poison oak spray with minimal relief.

## 2014-08-17 NOTE — ED Provider Notes (Signed)
Bobby White is a 53 y.o. male who presents to Urgent Care today for rash. Patient has a pruritic rash on his extremities and trunk present for about one week. It is consistent with prior episodes of poison oak or poison ivy. He suspects that his dog exposed him to the plant. He's tried some over-the-counter creams which have not helped. In the past he has done well with a steroid shot and prednisone dosepak. No fevers or chills nausea vomiting or diarrhea.   Past Medical History  Diagnosis Date  . Obesity   . Impaired fasting glucose 2009  . Varicose vein   . Hypertension 1/10  . GERD (gastroesophageal reflux disease)   . Arthritis   . Hepatitis     Hep C --had to get shots  . Sleep apnea   . Coronary artery disease    History  Substance Use Topics  . Smoking status: Former Smoker -- 1.50 packs/day for 15 years    Types: Cigarettes    Quit date: 11/18/1996  . Smokeless tobacco: Never Used  . Alcohol Use: Yes     Comment: socially   ROS as above Medications: Current Facility-Administered Medications  Medication Dose Route Frequency Provider Last Rate Last Dose  . methylPREDNISolone acetate (DEPO-MEDROL) injection 40 mg  40 mg Intramuscular Once Gregor Hams, MD       Current Outpatient Prescriptions  Medication Sig Dispense Refill  . albuterol (PROVENTIL HFA;VENTOLIN HFA) 108 (90 BASE) MCG/ACT inhaler Inhale 2 puffs into the lungs every 6 (six) hours as needed for wheezing.      Marland Kitchen amLODipine (NORVASC) 5 MG tablet Take 5 mg by mouth daily.      Marland Kitchen aspirin EC 325 MG tablet Take 1 tablet (325 mg total) by mouth 2 (two) times daily.  30 tablet  0  . benzonatate (TESSALON) 200 MG capsule Take 1 capsule (200 mg total) by mouth at bedtime. Take as needed for cough  12 capsule  1  . Cinnamon 500 MG capsule Take 500 mg by mouth 2 (two) times daily.      . Cyanocobalamin (B-12 PO) Take by mouth.      . doxycycline (VIBRAMYCIN) 100 MG capsule Take 1 capsule (100 mg total) by mouth 2  (two) times daily.  20 capsule  0  . ibuprofen (ADVIL,MOTRIN) 800 MG tablet Take 800 mg by mouth every 8 (eight) hours as needed (for pain).      Marland Kitchen KRILL OIL PO Take 1 capsule by mouth daily.      . Lorcaserin HCl (BELVIQ) 10 MG TABS Take 10 mg by mouth.      . magnesium gluconate (MAGONATE) 500 MG tablet Take 500 mg by mouth daily.       . meloxicam (MOBIC) 15 MG tablet TAKE 1 TABLET DAILY  90 tablet  0  . metoprolol succinate (TOPROL-XL) 25 MG 24 hr tablet Take 25 mg by mouth daily.        . nitroGLYCERIN (NITROSTAT) 0.4 MG SL tablet Place 0.4 mg under the tongue every 5 (five) minutes as needed for chest pain.       Marland Kitchen omeprazole (PRILOSEC) 20 MG capsule Take 20 mg by mouth daily.       . PredniSONE 5 MG KIT 12 day dosepack po  1 kit  0  . simvastatin (ZOCOR) 40 MG tablet TAKE 1 TABLET AT BEDTIME  90 tablet  0  . topiramate (TOPAMAX) 50 MG tablet Take 50 mg by mouth 2 (  two) times daily with a meal.         Exam:  BP 127/82  Pulse 70  Temp(Src) 98.3 F (36.8 C) (Oral)  Resp 18  Ht '5\' 11"'  (1.803 m)  Wt 275 lb (124.739 kg)  BMI 38.37 kg/m2  SpO2 96% Gen: Well NAD HEENT: EOMI,  MMM Lungs: Normal work of breathing. CTABL Heart: RRR no MRG Abd: NABS, Soft. Nondistended, Nontender Exts: Brisk capillary refill, warm and well perfused.  Skin: Erythematous maculopapular rash on both upper extremities right leg and trunk.   No results found for this or any previous visit (from the past 24 hour(s)). No results found.  Assessment and Plan: 53 y.o. male with poison ivy or poison oak dermatitis. Plan to treat with 40 mg IM Depo-Medrol and prednisone dosepak.   Discussed warning signs or symptoms. Please see discharge instructions. Patient expresses understanding.     Gregor Hams, MD 08/17/14 [place]

## 2014-10-05 ENCOUNTER — Emergency Department
Admission: EM | Admit: 2014-10-05 | Discharge: 2014-10-05 | Disposition: A | Discharge status: Home / Self Care | Payer: BC Managed Care – PPO | Source: Home / Self Care | Attending: Emergency Medicine | Admitting: Emergency Medicine

## 2014-10-05 ENCOUNTER — Encounter: Payer: Self-pay | Admitting: *Deleted

## 2014-10-05 DIAGNOSIS — L03115 Cellulitis of right lower limb: Secondary | ICD-10-CM

## 2014-10-05 DIAGNOSIS — L97911 Non-pressure chronic ulcer of unspecified part of right lower leg limited to breakdown of skin: Secondary | ICD-10-CM

## 2014-10-05 MED ORDER — DOXYCYCLINE HYCLATE 100 MG PO CAPS: 100.0000 mg | ORAL_CAPSULE | Freq: Two times a day (BID) | ORAL | Status: DC

## 2014-10-05 MED ORDER — CEFTRIAXONE SODIUM 1 G IJ SOLR
1.0000 g | INTRAMUSCULAR | Status: AC
  Administered 2014-10-05: 1 g via INTRAMUSCULAR

## 2014-10-05 NOTE — ED Provider Notes (Signed)
CSN: [id number]     Arrival date & time 10/05/14  1620 History   First MD Initiated Contact with Patient 10/05/14 1633     Chief Complaint  Patient presents with  . Wound Infection   Javyn reports hitting RLE with wire from a plant trimmer 6 months ago. He thought for a while it was a spider bite.  Treated with Bactrim in June which he stopped because of developing a rash, felt to have been from the Septra.  He has not followed up since.  PCP is Dr Madilyn Fireman. HPI C/o worsening pain/ scant yellow drainage ulcerative wound RLE. No fever or chills. No n,v,d Pain in RLE is mild-moderate at times. Past Medical History  Diagnosis Date  . Obesity   . Impaired fasting glucose 2009  . Varicose vein   . Hypertension 1/10  . GERD (gastroesophageal reflux disease)   . Arthritis   . Hepatitis     Hep C --had to get shots  . Sleep apnea   . Coronary artery disease    Past Surgical History  Procedure Laterality Date  . Pins in right hand  1990's    4th and 5th digits-- removed 6 weeks afterwards  . 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 Cardiac catheterization      2012  . 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 Total knee arthroplasty Right 09/06/2013    Procedure: TOTAL KNEE ARTHROPLASTY;  Surgeon: Kerin Salen, MD;  Location: [place];  Service: Orthopedics;  Laterality: Right;  . Total knee arthroplasty Left 11/01/2013    DR Mayer Camel  . Total knee arthroplasty Left 11/01/2013    Procedure: TOTAL KNEE ARTHROPLASTY- LEFT;  Surgeon: Kerin Salen, MD;  Location: [place];  Service: Orthopedics;  Laterality: Left;   Family History  Problem Relation Age of Onset  . Diabetes Mother   . Cancer Father     pancreatic   History  Substance Use Topics  . Smoking status: Former Smoker -- 1.50 packs/day for 15 years    Types: Cigarettes    Quit date: 11/18/1996  . Smokeless tobacco: Never Used  . Alcohol Use: Yes     Comment: socially    Review of Systems    All other systems reviewed and are negative.   Allergies  Ace inhibitors and Bactrim  Home Medications   Prior to Admission medications   Medication Sig Start Date End Date Taking? Authorizing Provider  albuterol (PROVENTIL HFA;VENTOLIN HFA) 108 (90 BASE) MCG/ACT inhaler Inhale 2 puffs into the lungs every 6 (six) hours as needed for wheezing.    Historical Provider, MD  amLODipine (NORVASC) 5 MG tablet Take 5 mg by mouth daily.    Historical Provider, MD  aspirin EC 325 MG tablet Take 1 tablet (325 mg total) by mouth 2 (two) times daily. 11/01/13   Leighton Parody, PA-C  Cinnamon 500 MG capsule Take 500 mg by mouth 2 (two) times daily.    Historical Provider, MD  Cyanocobalamin (B-12 PO) Take by mouth.    Historical Provider, MD  doxycycline (VIBRAMYCIN) 100 MG capsule Take 1 capsule (100 mg total) by mouth 2 (two) times daily. 10/05/14   Jacqulyn Cane, MD  ibuprofen (ADVIL,MOTRIN) 800 MG tablet Take 800 mg by mouth every 8 (eight) hours as needed (for pain).    Historical Provider, MD  KRILL OIL PO Take 1 capsule by mouth daily.    Historical Provider,  MD  Lorcaserin HCl (BELVIQ) 10 MG TABS Take 10 mg by mouth. 04/08/14   Historical Provider, MD  magnesium gluconate (MAGONATE) 500 MG tablet Take 500 mg by mouth daily.     Historical Provider, MD  meloxicam (MOBIC) 15 MG tablet TAKE 1 TABLET DAILY 08/10/14   Hali Marry, MD  metoprolol succinate (TOPROL-XL) 25 MG 24 hr tablet Take 25 mg by mouth daily.      Historical Provider, MD  nitroGLYCERIN (NITROSTAT) 0.4 MG SL tablet Place 0.4 mg under the tongue every 5 (five) minutes as needed for chest pain.  04/29/13 04/29/14  Historical Provider, MD  omeprazole (PRILOSEC) 20 MG capsule Take 20 mg by mouth daily.     Historical Provider, MD  simvastatin (ZOCOR) 40 MG tablet TAKE 1 TABLET AT BEDTIME 08/08/14   Hali Marry, MD  topiramate (TOPAMAX) 50 MG tablet Take 50 mg by mouth 2 (two) times daily with a meal.  06/08/13   Historical  Provider, MD   BP 137/81 mmHg  Pulse 84  Temp(Src) 98.7 F (37.1 C) (Oral)  Resp 14  Wt 285 lb (129.275 kg)  SpO2 95% Physical Exam  Constitutional: He is oriented to person, place, and time. He appears well-developed and well-nourished. No distress.  HENT:  Head: Normocephalic and atraumatic.  Eyes: Conjunctivae and EOM are normal. Pupils are equal, round, and reactive to light. No scleral icterus.  Neck: Normal range of motion.  Cardiovascular: Normal rate.   Pulmonary/Chest: Effort normal.  Abdominal: He exhibits no distension.  Musculoskeletal: Normal range of motion.       Feet:   irregular, full skin thickness infected wound ulcer, 2 x 2 centimeter right anterior lower leg. Scant yellow drainage, swab sent for culture. Very ttp. No fluctuance or red streaks. Neurovascular distally intact.  Neurological: He is alert and oriented to person, place, and time.  Skin: Skin is warm.  Psychiatric: He has a normal mood and affect.  Nursing note and vitals reviewed.   ED Course  Procedures (including critical care time) Labs Review Labs Reviewed  WOUND CULTURE    Imaging Review No results found.   MDM   1. Cellulitis of leg, right   2. Leg ulcer, right, limited to breakdown of skin    irregular, full skin thickness infected wound ulcer, 2 x 2 centimeter right anterior lower leg  Treatment options discussed, as well as risks, benefits, alternatives. Patient voiced understanding and agreement with the following plans:  Wound culture Wound cleansed with Hibiclens, irrigated Dressed with Mepilex border dressing Rocephin 1 g IM stat Doxycycline 100 mg twice a day.--14 day supply given Follow-up with Dr. Madilyn Fireman, his PCP within one week, Emergency room if any red flags He voiced understanding and agreement with above. Over 25 minutes spent, greater than 50% of the time spent for counseling and coordination of care.   Jacqulyn Cane, MD 10/06/14 1344

## 2014-10-05 NOTE — ED Notes (Signed)
Bobby White reports hitting RLE with wire from a plant trimmer 6 months ago. He thought for a while it was a spider bite. Treated with Bactrim which he stopped because of developing a rash. He has not followed up since.

## 2014-10-07 ENCOUNTER — Ambulatory Visit (INDEPENDENT_AMBULATORY_CARE_PROVIDER_SITE_OTHER): Payer: BC Managed Care – PPO | Admitting: Family Medicine

## 2014-10-07 ENCOUNTER — Encounter: Payer: Self-pay | Admitting: Family Medicine

## 2014-10-07 VITALS — BP 128/81 | HR 88 | Temp 98.1°F | Ht 71.0 in | Wt 283.0 lb

## 2014-10-07 DIAGNOSIS — L98492 Non-pressure chronic ulcer of skin of other sites with fat layer exposed: Secondary | ICD-10-CM | POA: Diagnosis not present
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
## 2014-10-08 LAB — WOUND CULTURE: Gram Stain: NONE SEEN

## 2014-10-17 ENCOUNTER — Encounter: Payer: Self-pay | Admitting: Family Medicine

## 2014-10-17 ENCOUNTER — Ambulatory Visit (INDEPENDENT_AMBULATORY_CARE_PROVIDER_SITE_OTHER): Payer: BC Managed Care – PPO | Admitting: Family Medicine

## 2014-10-17 ENCOUNTER — Ambulatory Visit (INDEPENDENT_AMBULATORY_CARE_PROVIDER_SITE_OTHER): Payer: BC Managed Care – PPO | Admitting: *Deleted

## 2014-10-17 VITALS — BP 130/84 | HR 83 | Wt 296.0 lb

## 2014-10-17 DIAGNOSIS — S81001D Unspecified open wound, right knee, subsequent encounter: Secondary | ICD-10-CM | POA: Diagnosis not present

## 2014-10-17 DIAGNOSIS — S91001D Unspecified open wound, right ankle, subsequent encounter: Secondary | ICD-10-CM

## 2014-10-17 DIAGNOSIS — Z23 Encounter for immunization: Secondary | ICD-10-CM | POA: Diagnosis not present

## 2014-10-17 DIAGNOSIS — S81801D Unspecified open wound, right lower leg, subsequent encounter: Secondary | ICD-10-CM | POA: Diagnosis not present

## 2014-10-17 NOTE — Progress Notes (Signed)
   Subjective:    Patient ID: Bobby White, male    DOB: [birth year], 53 y.o.   MRN: [id number]  HPI   Wound check on the left anterior shin.  He did take photos for comparison. Has about 4 more days of doxy left.  Has had some oozing from the edges of bandage.  Says still painful in the shower. Still tender to touch.     Review of Systems     Objective:   Physical Exam  Musculoskeletal:       Legs: Wound is healing. His affect is a circular area along the inferior border that is actually healing very nicely. The medial edge is healing well. The lateral and superior edge have not made much progress from 10 days ago. Wound appears moist. Drainage is mostly yellow. Still some surrounding erythema and bruising along the edge. Clean the area with saline and applied another DuoDERM bandage today with Caban dressing on top.          Assessment & Plan:  Wound care - will continue Duoderm. Lower edge of the wound is healing nicely. Upper edge hasn't made much progress. Wound still appears to be moist.  Complete antibiotic. Follow-up in 10 days.

## 2014-10-20 ENCOUNTER — Ambulatory Visit: Payer: BC Managed Care – PPO | Admitting: Family Medicine

## 2014-10-27 ENCOUNTER — Encounter: Payer: Self-pay | Admitting: Family Medicine

## 2014-10-27 ENCOUNTER — Ambulatory Visit (INDEPENDENT_AMBULATORY_CARE_PROVIDER_SITE_OTHER): Payer: BC Managed Care – PPO | Admitting: Family Medicine

## 2014-10-27 VITALS — BP 132/83 | HR 81 | Ht 71.0 in | Wt 294.0 lb

## 2014-10-27 DIAGNOSIS — S81001D Unspecified open wound, right knee, subsequent encounter: Secondary | ICD-10-CM | POA: Diagnosis not present

## 2014-10-27 DIAGNOSIS — S81801D Unspecified open wound, right lower leg, subsequent encounter: Secondary | ICD-10-CM | POA: Diagnosis not present

## 2014-10-27 DIAGNOSIS — S91001D Unspecified open wound, right ankle, subsequent encounter: Secondary | ICD-10-CM

## 2014-10-27 NOTE — Progress Notes (Signed)
   Subjective:    Patient ID: Bobby White, male    DOB: 05/20/1961, 53 y.o.   MRN: [id number]  HPI Follow-up wound check on the right lower extremity. He still been using the DuoDERM in changing it every 2-3 days. He's been wrapping it with Caban dressing to keep it more tight. He still gets a lot of swelling in the right lower extremity especially compared to the left. It's worse as the day goes on and looks better in the morning.   Review of Systems     Objective:   Physical Exam   Areas measuring about 3.25 x 2.4 cm ulcerated area.  There is a smaller 1 cm round area below. It is healing nicely.      Assessment & Plan:  Wound-seems to be healing. Will continue it due to an dressing every 2-3 days. Still apply the Caban dressing, but will add an Ace wrap to start just above his toes up to mid lower leg to see if this will help compress the swelling and speed the healing process. Similar to an inability. We discussed switching to an [employer] but he says he really wants to be able to take a shower.

## 2014-10-27 NOTE — Addendum Note (Signed)
Addended by: Beatrice Lecher D on: 10/27/2014 04:18 PM   Modules accepted: Level of Service

## 2014-11-01 ENCOUNTER — Other Ambulatory Visit: Payer: Self-pay | Admitting: Family Medicine

## 2014-11-04 ENCOUNTER — Ambulatory Visit (INDEPENDENT_AMBULATORY_CARE_PROVIDER_SITE_OTHER): Payer: BC Managed Care – PPO | Admitting: Physician Assistant

## 2014-11-04 ENCOUNTER — Encounter: Payer: Self-pay | Admitting: Physician Assistant

## 2014-11-04 VITALS — BP 126/69 | HR 81 | Ht 71.0 in | Wt 290.0 lb

## 2014-11-04 DIAGNOSIS — S91001D Unspecified open wound, right ankle, subsequent encounter: Secondary | ICD-10-CM

## 2014-11-04 DIAGNOSIS — S81801D Unspecified open wound, right lower leg, subsequent encounter: Secondary | ICD-10-CM | POA: Diagnosis not present

## 2014-11-04 DIAGNOSIS — S81001D Unspecified open wound, right knee, subsequent encounter: Secondary | ICD-10-CM | POA: Diagnosis not present

## 2014-11-06 ENCOUNTER — Other Ambulatory Visit: Payer: Self-pay | Admitting: Family Medicine

## 2014-11-06 NOTE — Progress Notes (Signed)
   Subjective:    Patient ID: Bobby White, male    DOB: [birth year], 53 y.o.   MRN: [id number]  HPI   8 day follow up on right anterior shin wound. Pt changing duoderm every 2-3 days. Seems to be doing better.    Review of Systems  All other systems reviewed and are negative.      Objective:   Physical Exam  Skin:             Assessment & Plan:  Right leg open wound- irrigated with NS. Placed duoderm with coban and ace. Leg swelling much better. Change dressing every 2-3 days. Follow up in 10 days.

## 2014-11-15 ENCOUNTER — Encounter: Payer: Self-pay | Admitting: Family Medicine

## 2014-11-15 ENCOUNTER — Ambulatory Visit (INDEPENDENT_AMBULATORY_CARE_PROVIDER_SITE_OTHER): Payer: BC Managed Care – PPO | Admitting: Family Medicine

## 2014-11-15 VITALS — BP 134/88 | HR 91 | Ht 71.0 in | Wt 293.0 lb

## 2014-11-15 DIAGNOSIS — S91001D Unspecified open wound, right ankle, subsequent encounter: Secondary | ICD-10-CM

## 2014-11-15 DIAGNOSIS — S81801D Unspecified open wound, right lower leg, subsequent encounter: Secondary | ICD-10-CM | POA: Diagnosis not present

## 2014-11-15 DIAGNOSIS — S81001D Unspecified open wound, right knee, subsequent encounter: Secondary | ICD-10-CM | POA: Diagnosis not present

## 2014-11-15 MED ORDER — AMLODIPINE BESYLATE 5 MG PO TABS: 5.0000 mg | ORAL_TABLET | Freq: Every day | ORAL | Status: DC

## 2014-11-15 NOTE — Progress Notes (Signed)
   Subjective:    Patient ID: Bobby White, male    DOB: 12/02/1960, 53 y.o.   MRN: [id number]  HPI  F/u open wound/ulcer on right lower leg.- Has been applying the DuoDERM in changing the dressing every 2-3 days. He's noticed that it's starting to bulge out more than was over the last week. He denies any increased pain or drainage. He's been keeping an eye on the surrounding erythema and it seems to actually be getting a little smaller.   Review of Systems     Objective:   Physical Exam  Constitutional: He appears well-developed and well-nourished.  Skin: Skin is warm.  Open wound with granulation tissue at base.  2 x 3.25 cm in size.  Some serous drainage.  No pus,  Has dusky/erythema around teh edge.  Has a small 1 x 1.5 cm water blister laterally.            Assessment & Plan:  Right leg open wound/ulceration-it really has not changed in size. The one looks clean but hasn't really gotten smaller. We discussed different options. I will we will continue with current regimen and monitor carefully. Follow back up in 10 days. If not getting smaller then we may need to change to [employer]. He prefers not to do this if at all possible because it is really difficult for him not to be able to shower after work.

## 2014-11-24 ENCOUNTER — Encounter: Payer: Self-pay | Admitting: Family Medicine

## 2014-11-24 ENCOUNTER — Ambulatory Visit (INDEPENDENT_AMBULATORY_CARE_PROVIDER_SITE_OTHER): Payer: BLUE CROSS/BLUE SHIELD | Admitting: Family Medicine

## 2014-11-24 VITALS — BP 134/87 | HR 87 | Wt 296.0 lb

## 2014-11-24 DIAGNOSIS — S81801D Unspecified open wound, right lower leg, subsequent encounter: Secondary | ICD-10-CM

## 2014-11-24 NOTE — Progress Notes (Signed)
   Subjective:    Patient ID: Bobby White, male    DOB: January 17, 1961, 54 y.o.   MRN: [id number]  HPI Right LE ulceration - 10 day f/u.  He is continuing to apply duoderm and changing every other day. He feels it is getting better. Still wrapping with ACE for support since gets chronic swelling. No fever, chills, etc.   Review of Systems     Objective:   Physical Exam   Lesion is approx 2 x 3 cm today  Right border seems to be getting smaller. Center with good clean granulation tissue.      Assessment & Plan:  Right LE ulceration - small improvement in size.  Continue current plan of care. F/U in 2 weeks as long as improving.

## 2014-12-08 ENCOUNTER — Ambulatory Visit (INDEPENDENT_AMBULATORY_CARE_PROVIDER_SITE_OTHER): Payer: BLUE CROSS/BLUE SHIELD | Admitting: Family Medicine

## 2014-12-08 ENCOUNTER — Encounter: Payer: Self-pay | Admitting: Family Medicine

## 2014-12-08 VITALS — BP 135/85 | HR 82 | Wt 296.0 lb

## 2014-12-08 DIAGNOSIS — S91001D Unspecified open wound, right ankle, subsequent encounter: Secondary | ICD-10-CM | POA: Diagnosis not present

## 2014-12-08 DIAGNOSIS — S81001D Unspecified open wound, right knee, subsequent encounter: Secondary | ICD-10-CM | POA: Diagnosis not present

## 2014-12-08 DIAGNOSIS — S81801D Unspecified open wound, right lower leg, subsequent encounter: Secondary | ICD-10-CM | POA: Diagnosis not present

## 2014-12-08 NOTE — Progress Notes (Signed)
   Subjective:    Patient ID: Bobby White, male    DOB: 07-Nov-1961, 54 y.o.   MRN: [id number]  HPI  follow-up ulceration /wound of right lower leg. He feels like it's getting significantly better since last time he was here. He  Found some Coban dressing that he can reuse and its washable.  He is using the DuoDERM and is able to sleep on for 3 days before dressing changes.   Review of Systems     Objective:   Physical Exam  lesion on right lower leg is 1.2 x 1.5 cm in size. That's a 40% reduction in one direction and 50% reduction in the direction compared to previous measurements.       Assessment & Plan:  Leg wound/ulceration -  Significant improvement since last time. He's actually been using the co-flex wrap starting at the midfoot and going up to just below the knee. This is made a big difference in her swelling and his healing process. Continue current regimen until wound completely closes. Unfortunately he has lost his job so may not be able to return. As long as it is continuing to heal than continue current regimen. If he has any problems then please let us know.

## 2015-01-17 ENCOUNTER — Other Ambulatory Visit: Payer: Self-pay | Admitting: Family Medicine

## 2015-03-31 ENCOUNTER — Telehealth: Payer: Self-pay | Admitting: Family Medicine

## 2015-03-31 NOTE — Telephone Encounter (Signed)
[facility]day supply on meloxicam 15mg  and simvastatin 40mg . Patient needs to be set up for a f/u appt. Left message for Pt to call for scheduling.

## 2015-04-03 ENCOUNTER — Other Ambulatory Visit: Payer: Self-pay | Admitting: Family Medicine

## 2015-04-03 MED ORDER — MELOXICAM 15 MG PO TABS: 15.0000 mg | ORAL_TABLET | Freq: Every day | ORAL | Status: DC

## 2015-04-03 MED ORDER — SIMVASTATIN 40 MG PO TABS: 40.0000 mg | ORAL_TABLET | Freq: Every day | ORAL | Status: DC

## 2015-04-03 NOTE — Telephone Encounter (Signed)
Patient scheduled a f/u appt, will [facility] so Pt will not run out prior to scheduled appt.

## 2015-04-11 ENCOUNTER — Ambulatory Visit (INDEPENDENT_AMBULATORY_CARE_PROVIDER_SITE_OTHER): Payer: Managed Care, Other (non HMO) | Admitting: Family Medicine

## 2015-04-11 ENCOUNTER — Encounter: Payer: Self-pay | Admitting: Family Medicine

## 2015-04-11 VITALS — BP 132/84 | HR 66 | Ht 71.0 in | Wt 299.0 lb

## 2015-04-11 DIAGNOSIS — S81001D Unspecified open wound, right knee, subsequent encounter: Secondary | ICD-10-CM

## 2015-04-11 DIAGNOSIS — S81801D Unspecified open wound, right lower leg, subsequent encounter: Secondary | ICD-10-CM

## 2015-04-11 DIAGNOSIS — R7301 Impaired fasting glucose: Secondary | ICD-10-CM | POA: Diagnosis not present

## 2015-04-11 DIAGNOSIS — S91001D Unspecified open wound, right ankle, subsequent encounter: Secondary | ICD-10-CM

## 2015-04-11 DIAGNOSIS — I10 Essential (primary) hypertension: Secondary | ICD-10-CM | POA: Diagnosis not present

## 2015-04-11 LAB — CBC WITH DIFFERENTIAL/PLATELET
BASOS PCT: 1 % (ref 0–1)
Basophils Absolute: 0.1 10*3/uL (ref 0.0–0.1)
Eosinophils Absolute: 0.2 10*3/uL (ref 0.0–0.7)
Eosinophils Relative: 3 % (ref 0–5)
HEMATOCRIT: 42.9 % (ref 39.0–52.0)
HEMOGLOBIN: 14.6 g/dL (ref 13.0–17.0)
LYMPHS ABS: 1.6 10*3/uL (ref 0.7–4.0)
Lymphocytes Relative: 25 % (ref 12–46)
MCH: 28.3 pg (ref 26.0–34.0)
MCHC: 34 g/dL (ref 30.0–36.0)
MCV: 83.3 fL (ref 78.0–100.0)
MPV: 8 fL — AB (ref 8.6–12.4)
Monocytes Absolute: 0.6 10*3/uL (ref 0.1–1.0)
Monocytes Relative: 9 % (ref 3–12)
NEUTROS PCT: 62 % (ref 43–77)
Neutro Abs: 3.9 10*3/uL (ref 1.7–7.7)
Platelets: 216 10*3/uL (ref 150–400)
RBC: 5.15 MIL/uL (ref 4.22–5.81)
RDW: 14.2 % (ref 11.5–15.5)
WBC: 6.3 10*3/uL (ref 4.0–10.5)

## 2015-04-11 LAB — COMPLETE METABOLIC PANEL WITH GFR
ALBUMIN: 4.5 g/dL (ref 3.5–5.2)
ALT: 29 U/L (ref 0–53)
AST: 26 U/L (ref 0–37)
Alkaline Phosphatase: 79 U/L (ref 39–117)
BILIRUBIN TOTAL: 0.8 mg/dL (ref 0.2–1.2)
BUN: 25 mg/dL — ABNORMAL HIGH (ref 6–23)
CO2: 29 meq/L (ref 19–32)
Calcium: 9.6 mg/dL (ref 8.4–10.5)
Chloride: 104 mEq/L (ref 96–112)
Creat: 0.81 mg/dL (ref 0.50–1.35)
GFR, Est African American: >89 mL/min
GLUCOSE: 98 mg/dL (ref 70–99)
Potassium: 5 mEq/L (ref 3.5–5.3)
Sodium: 144 mEq/L (ref 135–145)
Total Protein: 6.5 g/dL (ref 6.0–8.3)

## 2015-04-11 LAB — LIPID PANEL
CHOLESTEROL: 140 mg/dL (ref 0–200)
HDL: 35 mg/dL — ABNORMAL LOW (ref >=40)
LDL CALC: 73 mg/dL (ref 0–99)
Total CHOL/HDL Ratio: 4 Ratio
Triglycerides: 160 mg/dL — ABNORMAL HIGH (ref <150)
VLDL: 32 mg/dL (ref 0–40)

## 2015-04-11 LAB — POCT GLYCOSYLATED HEMOGLOBIN (HGB A1C): HEMOGLOBIN A1C: 5.5

## 2015-04-11 MED ORDER — MELOXICAM 15 MG PO TABS: 15.0000 mg | ORAL_TABLET | Freq: Every day | ORAL | Status: DC

## 2015-04-11 MED ORDER — SIMVASTATIN 40 MG PO TABS: 40.0000 mg | ORAL_TABLET | Freq: Every day | ORAL | Status: DC

## 2015-04-11 MED ORDER — METOPROLOL SUCCINATE ER 25 MG PO TB24: 25.0000 mg | ORAL_TABLET | Freq: Every day | ORAL | Status: DC

## 2015-04-11 NOTE — Progress Notes (Signed)
   Subjective:    Patient ID: Bobby White, male    DOB: [birth year], 54 y.o.   MRN: [id number]  HPI Hypertension- Pt denies chest pain, SOB, dizziness, or heart palpitations.  Taking meds as directed w/o problems.  Denies medication side effects.  His weight is up 3 lbs.  He started a new job and is working for himself now. Stress levels are much better.    IFG - no increased thirst or urination. He has gained a few more pounds since I last saw him.  Wound on right lower leg never completely healed. He been coming here for when care for several months. He was able to get the lesion down to about the size of a dime. Unfortunately over the last several months his continued to get larger again. He has tried the duodenal couple times. He is also tried just applying Vaseline and keeping it wrapped. He's been using normal saline to clean the wound.  Review of Systems     Objective:   Physical Exam  Constitutional: He is oriented to person, place, and time. He appears well-developed and well-nourished.  HENT:  Head: Normocephalic and atraumatic.  Cardiovascular: Normal rate, regular rhythm and normal heart sounds.   Pulmonary/Chest: Effort normal and breath sounds normal.  Neurological: He is alert and oriented to person, place, and time.  Skin: Skin is warm and dry.  Open wound approx 4 cm round with dusky apperance around the wound on the right lower leg.  Clear serous drianage.   Psychiatric: He has a normal mood and affect. His behavior is normal.          Assessment & Plan:  HTN  - initial pressure elevated. We'll repeat that today. Due for CMP and fasting lipid panel.  IFG -  Well controlled. Last A1C is 5.5. Stable.  Discussed need for weight loss.   Lab Results  Component Value Date   HGBA1C 5.4 04/19/2014    BMI of 41 - discussed options.  Discussed strategies to get back on track with diet and exercise. Level to see him get back down to 240 pounds. He was able to do this  previously on phentermine. We could certainly consider medication again if needed.  Open wound of right lower leg-will refer to when care center for further evaluation and treatment.

## 2015-04-16 ENCOUNTER — Encounter: Payer: Self-pay | Admitting: Emergency Medicine

## 2015-04-16 ENCOUNTER — Emergency Department
Admission: EM | Admit: 2015-04-16 | Discharge: 2015-04-16 | Disposition: A | Discharge status: Home / Self Care | Payer: Managed Care, Other (non HMO) | Source: Home / Self Care | Attending: Emergency Medicine | Admitting: Emergency Medicine

## 2015-04-16 DIAGNOSIS — L237 Allergic contact dermatitis due to plants, except food: Secondary | ICD-10-CM | POA: Diagnosis not present

## 2015-04-16 MED ORDER — METHYLPREDNISOLONE SODIUM SUCC 125 MG IJ SOLR
125.0000 mg | Freq: Once | INTRAMUSCULAR | Status: AC
  Administered 2015-04-16: 125 mg via INTRAVENOUS

## 2015-04-16 MED ORDER — PREDNISONE 10 MG PO TABS: ORAL_TABLET | ORAL | Status: DC

## 2015-04-16 NOTE — ED Notes (Signed)
Reports contact with poison ivy on coat of dog 2 days ago; on both arms; highly allergic to it. Currently under treatment for wound on right lower leg that started as poison ivy many months ago.

## 2015-04-16 NOTE — ED Provider Notes (Signed)
CSN: [id number]     Arrival date & time 04/16/15  1329 History   First MD Initiated Contact with Patient 04/16/15 1426     Chief Complaint  Patient presents with  . Rash   (Consider location/radiation/quality/duration/timing/severity/associated sxs/prior Treatment) Patient is a 54 y.o. male presenting with rash. The history is provided by the patient. No language interpreter was used.  Rash Location:  Shoulder/arm Shoulder/arm rash location:  L arm and R arm Quality: blistering and painful   Pain details:    Quality:  Aching   Severity:  No pain   Timing:  Constant   Progression:  Worsening Severity:  Moderate Onset quality:  Gradual Timing:  Constant Chronicity:  New Relieved by:  Nothing Worsened by:  Nothing tried Ineffective treatments:  None tried Pt exposed to poison ivy.  Past Medical History  Diagnosis Date  . Obesity   . Impaired fasting glucose 2009  . Varicose vein   . Hypertension 1/10  . GERD (gastroesophageal reflux disease)   . Arthritis   . Hepatitis     Hep C --had to get shots  . Sleep apnea   . Coronary artery disease    Past Surgical History  Procedure Laterality Date  . Pins in right hand  1990's    4th and 5th digits-- removed 6 weeks afterwards  . 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 Cardiac catheterization      2012  . 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 Total knee arthroplasty Left 11/01/2013    DR Mayer Camel  . Total knee arthroplasty Left 11/01/2013    Procedure: TOTAL KNEE ARTHROPLASTY- LEFT;  Surgeon: Kerin Salen, MD;  Location: [facility];  Service: Orthopedics;  Laterality: Left;   Family History  Problem Relation Age of Onset  . Diabetes Mother   . Cancer Father     pancreatic   History  Substance Use Topics  . Smoking  status: Former Smoker -- 1.50 packs/day for 15 years    Types: Cigarettes    Quit date: 11/18/1996  . Smokeless tobacco: Never Used  . Alcohol Use: Yes     Comment: socially    Review of Systems  Skin: Positive for rash.  All other systems reviewed and are negative.   Allergies  Ace inhibitors and Bactrim  Home Medications   Prior to Admission medications   Medication Sig Start Date End Date Taking? Authorizing Provider  albuterol (PROVENTIL HFA;VENTOLIN HFA) 108 (90 BASE) MCG/ACT inhaler Inhale 2 puffs into the lungs every 6 (six) hours as needed for wheezing.    Historical Provider, MD  amLODipine (NORVASC) 5 MG tablet Take 1 tablet (5 mg total) by mouth daily. 11/15/14   Hali Marry, MD  aspirin EC 325 MG tablet Take 1 tablet (325 mg total) by mouth 2 (two) times daily. 11/01/13   Leighton Parody, PA-C  Cinnamon 500 MG capsule Take 500 mg by mouth 2 (two) times daily.    Historical Provider, MD  Cyanocobalamin (B-12 PO) Take by mouth.    Historical Provider, MD  ibuprofen (ADVIL,MOTRIN) 800 MG tablet Take 800 mg by mouth every 8 (eight) hours as needed (for pain).    Historical Provider, MD  KRILL OIL PO Take 1 capsule by mouth  daily.    Historical Provider, MD  magnesium gluconate (MAGONATE) 500 MG tablet Take 500 mg by mouth daily.     Historical Provider, MD  meloxicam (MOBIC) 15 MG tablet Take 1 tablet (15 mg total) by mouth daily. 04/11/15   Hali Marry, MD  metoprolol succinate (TOPROL-XL) 25 MG 24 hr tablet Take 1 tablet (25 mg total) by mouth daily. 04/11/15   Hali Marry, MD  nitroGLYCERIN (NITROSTAT) 0.4 MG SL tablet Place 0.4 mg under the tongue every 5 (five) minutes as needed for chest pain.  04/29/13 04/29/14  Historical Provider, MD  omeprazole (PRILOSEC) 20 MG capsule Take 20 mg by mouth daily.     Historical Provider, MD  predniSONE (DELTASONE) 10 MG tablet 6,6,5,5,4,4,3,3,2,2,1,1 taper 04/16/15   Fransico Meadow, PA-C  simvastatin (ZOCOR) 40  MG tablet Take 1 tablet (40 mg total) by mouth at bedtime. 04/11/15   Hali Marry, MD   BP 128/82 mmHg  Pulse 87  Temp(Src) 98.1 F (36.7 C) (Oral)  Resp 16  Ht 5\' 11"  (1.803 m)  Wt 299 lb (135.626 kg)  BMI 41.72 kg/m2  SpO2 96% Physical Exam  Constitutional: He appears well-developed and well-nourished.  HENT:  Head: Normocephalic.  Eyes: Pupils are equal, round, and reactive to light.  Neck: Normal range of motion.  Cardiovascular: Normal rate.   Pulmonary/Chest: Effort normal.  Abdominal: Soft.  Musculoskeletal: Normal range of motion.  Neurological: He is alert.  Skin: There is erythema.  Red, raised, erythematous rash  Psychiatric: He has a normal mood and affect.  Nursing note and vitals reviewed.   ED Course  Procedures (including critical care time) Labs Review Labs Reviewed - No data to display  Imaging Review No results found.   MDM   1. Poison ivy    Solumedrol Prednisone taper Benadryl AVS    Fransico Meadow, PA-C 04/16/15 1540

## 2015-04-16 NOTE — Discharge Instructions (Signed)

## 2015-05-25 ENCOUNTER — Telehealth: Payer: Self-pay | Admitting: Family Medicine

## 2015-05-25 NOTE — Telephone Encounter (Signed)
Call pt: his amlodipine with his simvastatin can cause increased risk of muscle pains. I would like to switch the simvastatin to atorvastatin. It does go through a slightly different pathway in the liver and should not interact with his amlodipine. If he is okay with this then please let me know.

## 2015-05-25 NOTE — Telephone Encounter (Signed)
Called pt and lvm with recommendations asked that he return the call.Bobby White

## 2015-05-26 MED ORDER — OMEPRAZOLE 20 MG PO CPDR: 20.0000 mg | DELAYED_RELEASE_CAPSULE | Freq: Every day | ORAL | Status: DC

## 2015-05-26 MED ORDER — ATORVASTATIN CALCIUM 40 MG PO TABS: 40.0000 mg | ORAL_TABLET | Freq: Every day | ORAL | Status: DC

## 2015-05-26 NOTE — Telephone Encounter (Signed)
Pt called and stated that he would be ok with switching to Atorvatatin he would [facility].Marland KitchenMarland KitchenAudelia Hives Juliaetta

## 2015-05-26 NOTE — Telephone Encounter (Signed)
Rx sent 

## 2015-06-26 ENCOUNTER — Other Ambulatory Visit: Payer: Self-pay | Admitting: Family Medicine

## 2015-08-04 LAB — HM COLONOSCOPY

## 2015-10-09 ENCOUNTER — Encounter: Payer: Self-pay | Admitting: Family Medicine

## 2015-10-09 ENCOUNTER — Ambulatory Visit (INDEPENDENT_AMBULATORY_CARE_PROVIDER_SITE_OTHER): Payer: Managed Care, Other (non HMO) | Admitting: Family Medicine

## 2015-10-09 VITALS — BP 130/70 | HR 72 | Temp 98.0°F | Resp 18 | Wt 297.8 lb

## 2015-10-09 DIAGNOSIS — Z125 Encounter for screening for malignant neoplasm of prostate: Secondary | ICD-10-CM

## 2015-10-09 DIAGNOSIS — I2510 Atherosclerotic heart disease of native coronary artery without angina pectoris: Secondary | ICD-10-CM | POA: Diagnosis not present

## 2015-10-09 DIAGNOSIS — I10 Essential (primary) hypertension: Secondary | ICD-10-CM

## 2015-10-09 DIAGNOSIS — R7301 Impaired fasting glucose: Secondary | ICD-10-CM | POA: Diagnosis not present

## 2015-10-09 LAB — POCT GLYCOSYLATED HEMOGLOBIN (HGB A1C): Hemoglobin A1C: 5.5

## 2015-10-09 NOTE — Progress Notes (Signed)
   Subjective:    Patient ID: Bobby White, male    DOB: 1961-05-14, 55 y.o.   MRN: [id number]  HPI  CAD- No CP, SOB or palpitations. He has not had to use his nitroglycerin. He has had some intermittent soreness in his back but thinks that's just muscular. He is on a beta blocker, statin and aspirin daily.  IFG - no inc thrist or urination.   Hypertension- Pt denies chest pain, SOB, dizziness, or heart palpitations.  Taking meds as directed w/o problems.  Denies medication side effects.    Discussed that he's having frequent urination. He says especially in the morning like he has to urinate re-times within a couple of hours. This is actually been going on for quite some time. Last PSA level was back in 2013. No prior diagnosis of enlarged prostate gland. He also feels like EMS get some urgency at times it is hard to postpone urination. He also reports a decreased force of stream.  Review of Systems     Objective:   Physical Exam  Constitutional: He is oriented to person, place, and time. He appears well-developed and well-nourished.  HENT:  Head: Normocephalic and atraumatic.  Cardiovascular: Normal rate, regular rhythm and normal heart sounds.   Pulmonary/Chest: Effort normal and breath sounds normal.  Neurological: He is alert and oriented to person, place, and time.  Skin: Skin is warm and dry.  Psychiatric: He has a normal mood and affect. His behavior is normal.          Assessment & Plan:  CAD- stable. No recent symptoms. Follows regularly with Dr. Prince White. He is on an aspirin, statin, beta blocker.  IFG - A1C is 5.5, well controlled. Looks fantastic. Continue work on diet and exercise. Follow up in 6 months.  HTN - well controlled.  Continue current regimen for follow up in 6 months.  BPH- most likely BPH based on description today. We did not have time to do exam today but did have him complete an AUA score before he left. His score was 23. We'll also check a PSA  level with his blood work. We'll need to contact exam when I see him back and we can discuss options for treatment. He rated his quality of life as a 3, mixed.

## 2015-10-30 ENCOUNTER — Telehealth: Payer: Self-pay

## 2015-10-30 NOTE — Telephone Encounter (Signed)
Patient states he needs pretreatment before dental cleaning due to knee replacement. Please advise.

## 2015-10-31 MED ORDER — AMOXICILLIN 500 MG PO TABS: 2000.0000 mg | ORAL_TABLET | Freq: Once | ORAL | Status: DC

## 2015-10-31 NOTE — Telephone Encounter (Signed)
Prescription sent to [facility].

## 2015-10-31 NOTE — Telephone Encounter (Signed)
Patient called, message left that a prescription was called in to pharmacy.

## 2015-12-08 ENCOUNTER — Encounter: Payer: Self-pay | Admitting: Family Medicine

## 2015-12-08 ENCOUNTER — Ambulatory Visit (INDEPENDENT_AMBULATORY_CARE_PROVIDER_SITE_OTHER): Payer: Managed Care, Other (non HMO) | Admitting: Family Medicine

## 2015-12-08 VITALS — BP 141/84 | HR 76 | Wt 305.0 lb

## 2015-12-08 DIAGNOSIS — N400 Enlarged prostate without lower urinary tract symptoms: Secondary | ICD-10-CM | POA: Diagnosis not present

## 2015-12-08 MED ORDER — TAMSULOSIN HCL 0.4 MG PO CAPS: 0.4000 mg | ORAL_CAPSULE | Freq: Every day | ORAL | Status: DC

## 2015-12-08 NOTE — Progress Notes (Signed)
   Subjective:    Patient ID: Bobby White, male    DOB: [birth year], 55 y.o.   MRN: [id number]  HPI 55 year old male comes in today to discuss urinary symptoms. He is having frequent urination. He says especially in the morning  he has to urinate again within a couple of hours. This is actually been going on for quite some time. Last PSA level was back in 2013. No prior diagnosis of enlarged prostate gland. He also feels like also getting some urgency at times it is hard to postpone urination. He also reports a decreased force of stream. He is also concerned about the possibility of cancer. His father had pancreatic cancer. He reports difficulty postponing urination.    Review of Systems     Objective:   Physical Exam  Constitutional: He is oriented to person, place, and time. He appears well-developed and well-nourished.  HENT:  Head: Normocephalic and atraumatic.  Eyes: Conjunctivae and EOM are normal.  Cardiovascular: Normal rate.   Pulmonary/Chest: Effort normal.  Genitourinary: Rectal exam shows external hemorrhoid. Rectal exam shows no fissure, no tenderness and anal tone normal. Prostate is enlarged. Prostate is not tender.  Prostate is 2+ enlarged with no nodules or induration. It's slightly larger on the right compared to the left. Nontender.  Neurological: He is alert and oriented to person, place, and time.  Skin: Skin is dry. No pallor.  Psychiatric: He has a normal mood and affect. His behavior is normal.  Vitals reviewed.         Assessment & Plan:  BPH- exam confirmed enlarged prostate gland today. No induration or nodules. Will check a PSA at baseline and also to rule out prostate cancer. AUA score of 22 today. He rates his quality of life as mostly satisfied. We discussed the 2 main treatment options used for enlarged prostate. We'll start with Flomax. I warned about potential side effects of the medication. I'll see him back in 6 weeks. If he is doing well at  that time. Then we'll add a 5 alpha reductase inhibitor such as finasteride or dutasteride. Explained that based on his symptoms score he will likely need both medications. If for some reason medications are not effective then we will consider referral to urology for further evaluation and treatment

## 2015-12-08 NOTE — Patient Instructions (Signed)
Benign Prostatic Hypertrophy The prostate gland is part of the reproductive system of men. A normal prostate is about the size and shape of a walnut. The prostate gland produces a fluid that is mixed with sperm to make semen. This gland surrounds the urethra and is located in front of the rectum and just below the bladder. The bladder is where urine is stored. The urethra is the tube through which urine passes from the bladder to get out of the body. The prostate grows as a man ages. An enlarged prostate not caused by cancer is called benign prostatic hypertrophy (BPH). An enlarged prostate can press on the urethra. This can make it harder to pass urine. In the early stages of enlargement, the bladder can get by with a narrowed urethra by forcing the urine through. If the problem gets worse, medical or surgical treatment may be required.  This condition should be followed by your health care provider. The accumulation of urine in the bladder can cause infection. Back pressure and infection can progress to bladder damage and kidney (renal) failure. If needed, your health care provider may refer you to a specialist in kidney and prostate disease (urologist). CAUSES  BPH is a common health problem in men older than 50 years. This condition is a normal part of aging. However, not all men will develop problems from this condition. If the enlargement grows away from the urethra, then there will not be any compression of the urethra and resistance to urine flow.If the growth is toward the urethra and compresses it, you will experience difficulty urinating.  SYMPTOMS   Not able to completely empty your bladder.  Getting up often during the night to urinate.  Need to urinate frequently during the day.  Difficultly starting urine flow.  Decrease in size and strength of your urine stream.  Dribbling after urination.  Pain on urination (more common with infection).  Inability to pass urine. This needs  immediate treatment.  The development of a urinary tract infection. DIAGNOSIS  These tests will help your health care provider understand your problem:  A thorough history and physical examination.  A urination history, with the number of times you urinate, the amounts of urine, the strength of the urine stream, and the feeling of emptiness or fullness after urinating.  A postvoid bladder scan that measures any amount of urine that may remain in your bladder after you finish urinating.  Digital rectal exam. In a rectal exam, your health care provider checks your prostate by putting a gloved, lubricated finger into your rectum to feel the back of your prostate gland. This exam detects the size of your gland and abnormal lumps or growths.  Exam of your urine (urinalysis).  Prostate specific antigen (PSA) screening. This is a blood test used to screen for prostate cancer.  Rectal ultrasonography. This test uses sound waves to electronically produce a picture of your prostate gland. TREATMENT  Once symptoms begin, your health care provider will monitor your condition. Of the men with this condition, one third will have symptoms that stabilize, one third will have symptoms that improve, and one third will have symptoms that progress in the first year. Mild symptoms may not need treatment. Simple observation and yearly exams may be all that is required. Medicines and surgery are options for more severe problems. Your health care provider can help you make an informed decision for what is best. Two classes of medicines are available for relief of prostate symptoms:  Medicines  that shrink the prostate. This helps relieve symptoms. These medicines take time to work, and it may be months before any improvement is seen.  Uncommon side effects include problems with sexual function.  Medicines to relax the muscle of the prostate. This also relieves the obstruction by reducing any compression on the  urethra.This group of medicines work much faster than those that reduce the size of the prostate gland. Usually, one can experience improvement in days to weeks..  Side effects can include dizziness, fatigue, lightheadedness, and retrograde ejaculation (diminished volume of ejaculate). Several types of surgical treatments are available for relief of prostate symptoms:  Transurethral resection of the prostate (TURP)--In this treatment, an instrument is inserted through opening at the tip of the penis. It is used to cut away pieces of the inner core of the prostate. The pieces are removed through the same opening of the penis. This removes the obstruction and helps get rid of the symptoms.  Transurethral incision (TUIP)--In this procedure, small cuts are made in the prostate. This lessens the prostates pressure on the urethra.  Transurethral microwave thermotherapy (TUMT)--This procedure uses microwaves to create heat. The heat destroys and removes a small amount of prostate tissue.  Transurethral needle ablation (TUNA)--This is a procedure that uses radio frequencies to do the same as TUMT.  Interstitial laser coagulation (ILC)--This is a procedure that uses a laser to do the same as TUMT and TUNA.  Transurethral electrovaporization (TUVP)--This is a procedure that uses electrodes to do the same as the procedures listed above. SEEK MEDICAL CARE IF:   You develop a fever.  There is unexplained back pain.  Symptoms are not helped by medicines prescribed.  You develop side effects from the medicine you are taking.  Your urine becomes very dark or has a bad smell.  Your lower abdomen becomes distended and you have difficulty passing your urine. SEEK IMMEDIATE MEDICAL CARE IF:   You are suddenly unable to urinate. This is an emergency. You should be seen immediately.  There are large amounts of blood or clots in the urine.  Your urinary problems become unmanageable.  You develop  lightheadedness, severe dizziness, or you feel faint.  You develop moderate to severe low back or flank pain.  You develop chills or fever.   This information is not intended to replace advice given to you by your health care provider. Make sure you discuss any questions you have with your health care provider.   Document Released: 11/04/2005 Document Revised: 11/09/2013 Document Reviewed: 05/20/2013 Elsevier Interactive Patient Education 2016 Elsevier Inc.  

## 2015-12-09 LAB — PSA: PSA: 1.43 ng/mL (ref <=4.00)

## 2015-12-14 ENCOUNTER — Encounter: Payer: Self-pay | Admitting: *Deleted

## 2015-12-27 ENCOUNTER — Other Ambulatory Visit: Payer: Self-pay | Admitting: Family Medicine

## 2016-01-03 ENCOUNTER — Telehealth: Payer: Self-pay | Admitting: Family Medicine

## 2016-01-03 NOTE — Telephone Encounter (Signed)
These call patient. [facility] They weren't sure if he was taking simvastatin or atorvastatin. Both of these were prescribed without short period of time of each other. Please verify with patient and let me know.

## 2016-01-04 NOTE — Telephone Encounter (Signed)
Left message to call office regarding medication.

## 2016-01-04 NOTE — Telephone Encounter (Signed)
Pt is taking atorvastatin

## 2016-01-08 MED ORDER — ATORVASTATIN CALCIUM 40 MG PO TABS: 40.0000 mg | ORAL_TABLET | Freq: Every day | ORAL | Status: DC

## 2016-01-19 ENCOUNTER — Encounter: Payer: Self-pay | Admitting: Family Medicine

## 2016-01-19 ENCOUNTER — Ambulatory Visit (INDEPENDENT_AMBULATORY_CARE_PROVIDER_SITE_OTHER): Payer: Managed Care, Other (non HMO) | Admitting: Family Medicine

## 2016-01-19 VITALS — BP 136/70 | HR 83 | Wt 310.0 lb

## 2016-01-19 DIAGNOSIS — N400 Enlarged prostate without lower urinary tract symptoms: Secondary | ICD-10-CM

## 2016-01-19 DIAGNOSIS — Z23 Encounter for immunization: Secondary | ICD-10-CM | POA: Diagnosis not present

## 2016-01-19 DIAGNOSIS — R635 Abnormal weight gain: Secondary | ICD-10-CM | POA: Diagnosis not present

## 2016-01-19 DIAGNOSIS — Z6841 Body Mass Index (BMI) 40.0 and over, adult: Secondary | ICD-10-CM

## 2016-01-19 DIAGNOSIS — I10 Essential (primary) hypertension: Secondary | ICD-10-CM | POA: Diagnosis not present

## 2016-01-19 MED ORDER — NITROGLYCERIN 0.4 MG SL SUBL: 0.4000 mg | SUBLINGUAL_TABLET | SUBLINGUAL | Status: DC | PRN

## 2016-01-19 NOTE — Progress Notes (Signed)
   Subjective:    Patient ID: Bobby White, male    DOB: 04/28/1961, 55 y.o.   MRN: [id number]  HPI  Hypertension- Pt denies chest pain, SOB, dizziness, or heart palpitations.  Taking meds as directed w/o problems.  Denies medication side effects.    BPH- doing well on flomax. No S.E.  Has noticed sig improvement in sxs.  He is happy with the regimen.  Abnormal weight gain -/Morbid obesity/BMI 43.   Review of Systems     Objective:   Physical Exam  Constitutional: He is oriented to person, place, and time. He appears well-developed and well-nourished.  HENT:  Head: Normocephalic and atraumatic.  Cardiovascular: Normal rate, regular rhythm and normal heart sounds.   Pulmonary/Chest: Effort normal and breath sounds normal.  Neurological: He is alert and oriented to person, place, and time.  Skin: Skin is warm and dry.  Psychiatric: He has a normal mood and affect. His behavior is normal.          Assessment & Plan:  HTN - Controlled. Continue current regimen. Work on regular exercise and weight loss.  BPH - on Flomax without any side effects or problems. We discussed adding a 5 alpha reductase inhibitor when I saw him back. Right now he wants to hold off since he is doing well and consider that in the future. Follow back up in 3-4 months. Next  Abnormal weight gain/BMI 43-he is Bobby White been to the bariatric clinic here locally and failed to lose weight with Belviq. He did work with a Engineer, maintenance (IT) and fertility was not covered by his insurance. He is not interested in any type of bariatric surgery at this point in time but is interested in maybe trying a different type of medication. I gave him some information on Sexenda and contrary today he wants to look over it and let me know. If he does decide to start one of these medications we can send a prescription and then I can see him back in 4 weeks.

## 2016-01-22 ENCOUNTER — Telehealth: Payer: Self-pay

## 2016-01-22 MED ORDER — NALTREXONE-BUPROPION HCL ER 8-90 MG PO TB12: ORAL_TABLET | ORAL | Status: DC

## 2016-01-22 NOTE — Telephone Encounter (Signed)
Prescription for contrast sent to the pharmacy.

## 2016-01-22 NOTE — Telephone Encounter (Signed)
Bobby White states his insurance will cover the contrave. He would like it sent to [facility] in [place]. He would also like a coupon card.

## 2016-01-23 NOTE — Telephone Encounter (Signed)
Left message advising of medication.  

## 2016-02-06 ENCOUNTER — Other Ambulatory Visit: Payer: Self-pay | Admitting: *Deleted

## 2016-02-06 MED ORDER — OMEPRAZOLE 40 MG PO CPDR: 40.0000 mg | DELAYED_RELEASE_CAPSULE | Freq: Every day | ORAL | Status: DC

## 2016-03-03 ENCOUNTER — Other Ambulatory Visit: Payer: Self-pay | Admitting: Family Medicine

## 2016-03-07 ENCOUNTER — Ambulatory Visit (INDEPENDENT_AMBULATORY_CARE_PROVIDER_SITE_OTHER): Payer: Managed Care, Other (non HMO) | Admitting: Family Medicine

## 2016-03-07 ENCOUNTER — Encounter: Payer: Self-pay | Admitting: Family Medicine

## 2016-03-07 VITALS — BP 128/69 | HR 62 | Wt 301.0 lb

## 2016-03-07 DIAGNOSIS — L237 Allergic contact dermatitis due to plants, except food: Secondary | ICD-10-CM

## 2016-03-07 MED ORDER — METHYLPREDNISOLONE ACETATE 40 MG/ML IJ SUSP
40.0000 mg | Freq: Once | INTRAMUSCULAR | Status: AC
  Administered 2016-03-07: 40 mg via INTRAMUSCULAR

## 2016-03-07 NOTE — Progress Notes (Signed)
   Subjective:    Patient ID: Bobby White, male    DOB: Mar 29, 1961, 55 y.o.   MRN: [id number]  HPI Patient comes in today complaining of exposure to poison ivy. He was trying to chop it down in his yard he tried to stay way from as much as possible and even washed with a special poison ivy wash but unfortunately still broke out near the right temple and on the right forearm. He says it's extremely itchy. He is not using any treatment currently. No worsening or alleviating factors.   Review of Systems     Objective:   Physical Exam  Constitutional: He is oriented to person, place, and time. He appears well-developed and well-nourished.  HENT:  Head: Normocephalic and atraumatic.  Eyes: Conjunctivae and EOM are normal.  Cardiovascular: Normal rate.   Pulmonary/Chest: Effort normal.  Neurological: He is alert and oriented to person, place, and time.  Skin: Skin is warm and dry. No pallor.  Cluster of erythematous papules and vesicles on the right temple. And a few scattered erythematous papules on the right forearm.  Psychiatric: He has a normal mood and affect. His behavior is normal.  Vitals reviewed.         Assessment & Plan:  Poison ivy-we'll treat with 40 mg of Depo-Medrol IM per patient preference. Okay to use calamine lotion to help soothe the skin and decrease itching. Call if not better in one week.

## 2016-03-07 NOTE — Patient Instructions (Signed)
You can also use Benadryl or calamine lotion to help reduce the itching as well.   Poison [employer] ivy is a inflammation of the skin (contact dermatitis) caused by touching the allergens on the leaves of the ivy plant following previous exposure to the plant. The rash usually appears 48 hours after exposure. The rash is usually bumps (papules) or blisters (vesicles) in a linear pattern. Depending on your own sensitivity, the rash may simply cause redness and itching, or it may also progress to blisters which may break open. These must be well cared for to prevent secondary bacterial (germ) infection, followed by scarring. Keep any open areas dry, clean, dressed, and covered with an antibacterial ointment if needed. The eyes may also get puffy. The puffiness is worst in the morning and gets better as the day progresses. This dermatitis usually heals without scarring, within 2 to 3 weeks without treatment. HOME CARE INSTRUCTIONS  Thoroughly wash with soap and water as soon as you have been exposed to poison ivy. You have about one half hour to remove the plant resin before it will cause the rash. This washing will destroy the oil or antigen on the skin that is causing, or will cause, the rash. Be sure to wash under your fingernails as any plant resin there will continue to spread the rash. Do not rub skin vigorously when washing affected area. Poison ivy cannot spread if no oil from the plant remains on your body. A rash that has progressed to weeping sores will not spread the rash unless you have not washed thoroughly. It is also important to wash any clothes you have been wearing as these may carry active allergens. The rash will return if you wear the unwashed clothing, even several days later. Avoidance of the plant in the future is the best measure. Poison ivy plant can be recognized by the number of leaves. Generally, poison ivy has three leaves with flowering branches on a single stem. Diphenhydramine  may be purchased over the counter and used as needed for itching. Do not drive with this medication if it makes you drowsy.Ask your caregiver about medication for children. SEEK MEDICAL CARE IF:  Open sores develop.  Redness spreads beyond area of rash.  You notice purulent (pus-like) discharge.  You have increased pain.  Other signs of infection develop (such as fever).   This information is not intended to replace advice given to you by your health care provider. Make sure you discuss any questions you have with your health care provider.   Document Released: 11/01/2000 Document Revised: 01/27/2012 Document Reviewed: 04/12/2015 Elsevier Interactive Patient Education [employer].

## 2016-03-07 NOTE — Addendum Note (Signed)
Addended by: Teddy Spike on: 03/07/2016 04:08 PM   Modules accepted: Orders

## 2016-04-22 ENCOUNTER — Ambulatory Visit: Payer: Managed Care, Other (non HMO) | Admitting: Family Medicine

## 2016-04-26 ENCOUNTER — Ambulatory Visit (INDEPENDENT_AMBULATORY_CARE_PROVIDER_SITE_OTHER): Payer: Managed Care, Other (non HMO) | Admitting: Family Medicine

## 2016-04-26 ENCOUNTER — Encounter: Payer: Self-pay | Admitting: Family Medicine

## 2016-04-26 VITALS — BP 122/65 | HR 73 | Wt 293.0 lb

## 2016-04-26 DIAGNOSIS — N400 Enlarged prostate without lower urinary tract symptoms: Secondary | ICD-10-CM | POA: Diagnosis not present

## 2016-04-26 DIAGNOSIS — R635 Abnormal weight gain: Secondary | ICD-10-CM | POA: Diagnosis not present

## 2016-04-26 DIAGNOSIS — Z6841 Body Mass Index (BMI) 40.0 and over, adult: Secondary | ICD-10-CM

## 2016-04-26 DIAGNOSIS — I10 Essential (primary) hypertension: Secondary | ICD-10-CM | POA: Diagnosis not present

## 2016-04-26 NOTE — Progress Notes (Signed)
Subjective:    CC: BPH  HPI:  BPH - patient was started on Flomax about 5 months ago. He had noticed improvement in his symptoms. We discussed adding a 86F reductase inhibitor last saw him back but he declined at that time. On flomax.   Hypertension- Pt denies chest pain, SOB, dizziness, or heart palpitations.  Taking meds as directed w/o problems.  Denies medication side effects.    Abnormal weight gain-doing very well on contrary. He started on it about 2 months ago. He's not had any side effects except for dry mouth. It really just bothersome in the morning when he first wakes up. He has lost 7 pounds. He has not been able to exercise regularly 3 he works about 12 hour days is made it difficult for him to exercise.   Past medical history, Surgical history, Family history not pertinant except as noted below, Social history, Allergies, and medications have been entered into the medical record, reviewed, and corrections made.   Review of Systems: No fevers, chills, night sweats, weight loss, chest pain, or shortness of breath.   Objective:    General: Well Developed, well nourished, and in no acute distress.  Neuro: Alert and oriented x3, extra-ocular muscles intact, sensation grossly intact.  HEENT: Normocephalic, atraumatic  Skin: Warm and dry, no rashes. Cardiac: Regular rate and rhythm, no murmurs rubs or gallops, no lower extremity edema.  Respiratory: Clear to auscultation bilaterally. Not using accessory muscles, speaking in full sentences.   Impression and Recommendations:   BPH - well controlled on Flomax. Continue current regimen.Will follow-up in 6 months.  HTN -  Hypertension- Pt denies chest pain, SOB, dizziness, or heart palpitations.  Taking meds as directed w/o problems.  Denies medication side effects.    Abnormal weight gain/obesity/BMI 40-I think he is doing great with 7 pound weight loss. Continue current regimen. Strongly encouraged him to get back on track with  getting back into regular exercise.  Discussed colon cancer screening recommendations and guidelines.

## 2016-04-26 NOTE — Patient Instructions (Signed)
Please enroll in the contrave weight loss program.

## 2016-04-30 LAB — COMPLETE METABOLIC PANEL WITH GFR
ALT: 22 U/L (ref 9–46)
AST: 18 U/L (ref 10–35)
Albumin: 4.1 g/dL (ref 3.6–5.1)
Alkaline Phosphatase: 88 U/L (ref 40–115)
BUN: 13 mg/dL (ref 7–25)
CHLORIDE: 105 mmol/L (ref 98–110)
CO2: 24 mmol/L (ref 20–31)
Calcium: 8.6 mg/dL (ref 8.6–10.3)
Creat: 0.74 mg/dL (ref 0.70–1.33)
GFR, Est African American: >89 mL/min (ref >=60)
GFR, Est Non African American: >89 mL/min (ref >=60)
GLUCOSE: 104 mg/dL — AB (ref 65–99)
POTASSIUM: 4.4 mmol/L (ref 3.5–5.3)
SODIUM: 143 mmol/L (ref 135–146)
Total Bilirubin: 0.6 mg/dL (ref 0.2–1.2)
Total Protein: 5.9 g/dL — ABNORMAL LOW (ref 6.1–8.1)

## 2016-04-30 LAB — LIPID PANEL
CHOL/HDL RATIO: 2.6 ratio (ref <=5.0)
Cholesterol: 102 mg/dL — ABNORMAL LOW (ref 125–200)
HDL: 39 mg/dL — AB (ref >=40)
LDL Cholesterol: 47 mg/dL (ref <130)
Triglycerides: 81 mg/dL (ref <150)
VLDL: 16 mg/dL (ref <30)

## 2016-05-18 ENCOUNTER — Other Ambulatory Visit: Payer: Self-pay | Admitting: Family Medicine

## 2016-06-02 ENCOUNTER — Other Ambulatory Visit: Payer: Self-pay | Admitting: Family Medicine

## 2016-06-21 ENCOUNTER — Other Ambulatory Visit: Payer: Self-pay | Admitting: Family Medicine

## 2016-06-25 ENCOUNTER — Ambulatory Visit (INDEPENDENT_AMBULATORY_CARE_PROVIDER_SITE_OTHER): Payer: Managed Care, Other (non HMO) | Admitting: Family Medicine

## 2016-06-25 VITALS — BP 125/75 | HR 71 | Wt 287.0 lb

## 2016-06-25 DIAGNOSIS — R635 Abnormal weight gain: Secondary | ICD-10-CM | POA: Diagnosis not present

## 2016-06-25 MED ORDER — NALTREXONE-BUPROPION HCL ER 8-90 MG PO TB12: 2.0000 | ORAL_TABLET | Freq: Two times a day (BID) | ORAL | 0 refills | Status: DC

## 2016-06-25 NOTE — Progress Notes (Signed)
   Subjective:    Patient ID: Bobby White, male    DOB: Apr 18, 1961, 55 y.o.   MRN: [id number]  HPI  Bobby White is here for blood pressure and weight check. Diet and exercise is going well. Denies trouble sleeping or palpitations. He is having nightmares and less bowel movements. He doesn't want to stop taking medication at this time.    Review of Systems     Objective:   Physical Exam        Assessment & Plan:  Patient has lost weight so a refill of the Contrave will be sent to the pharmacy. Patient advised to increase his water intake to help with bowel movements. He will follow up in one month.    Abnormal weight gain - He lost 6 lbs in 8 week.  Great work.  F/U in 6-8 weeks.    Beatrice Lecher, MD

## 2016-06-27 ENCOUNTER — Ambulatory Visit: Payer: Managed Care, Other (non HMO) | Admitting: Family Medicine

## 2016-07-04 ENCOUNTER — Other Ambulatory Visit: Payer: Self-pay | Admitting: Family Medicine

## 2016-07-26 ENCOUNTER — Encounter: Payer: Self-pay | Admitting: Family Medicine

## 2016-07-26 ENCOUNTER — Ambulatory Visit (INDEPENDENT_AMBULATORY_CARE_PROVIDER_SITE_OTHER): Payer: Managed Care, Other (non HMO) | Admitting: Family Medicine

## 2016-07-26 VITALS — BP 134/71 | HR 65 | Wt 280.0 lb

## 2016-07-26 DIAGNOSIS — G5623 Lesion of ulnar nerve, bilateral upper limbs: Secondary | ICD-10-CM | POA: Diagnosis not present

## 2016-07-26 DIAGNOSIS — M778 Other enthesopathies, not elsewhere classified: Secondary | ICD-10-CM

## 2016-07-26 DIAGNOSIS — M779 Enthesopathy, unspecified: Secondary | ICD-10-CM

## 2016-07-26 DIAGNOSIS — M6588 Other synovitis and tenosynovitis, other site: Secondary | ICD-10-CM | POA: Diagnosis not present

## 2016-07-26 DIAGNOSIS — R635 Abnormal weight gain: Secondary | ICD-10-CM | POA: Diagnosis not present

## 2016-07-26 DIAGNOSIS — Z6839 Body mass index (BMI) 39.0-39.9, adult: Secondary | ICD-10-CM

## 2016-07-26 NOTE — Patient Instructions (Signed)
Follow up with Dr. Dianah Field in one month if the numbness and tingling is not getting better in her arms.

## 2016-07-26 NOTE — Progress Notes (Signed)
Subjective:    CC: Weight check.   HPI:  Abnormal weight gain - down 7 lbs. He is on Contrave 2 tabs po BID.  He is actually down 13 pounds from June. No S.E of the medication. He is doing well. Hasn't been to the gym in awhile as he and his wife have been on a cruise to [place] to renew their vows.    It's been getting some numbness in the fourth and fifth fingers. He's noticed that when he presses elbows on the armrest that he was getting numbness and pain down the arm and over the fourth and fifth digits. He says now he is getting numbness intermittently even without putting pressure on his elbows.  Also been getting some pain in both thumbs. He says when he plays video games for an extended period of time he says they will feel stiff and sore for at least 3 or 4 days afterwards. He wonders if he could be getting some arthritis.  Past medical history, Surgical history, Family history not pertinant except as noted below, Social history, Allergies, and medications have been entered into the medical record, reviewed, and corrections made.   Review of Systems: No fevers, chills, night sweats, weight loss, chest pain, or shortness of breath.   Objective:    General: Well Developed, well nourished, and in no acute distress.  Neuro: Alert and oriented x3, extra-ocular muscles intact, sensation grossly intact.  HEENT: Normocephalic, atraumatic  Skin: Warm and dry, no rashes. Cardiac: Regular rate and rhythm, no murmurs rubs or gallops, no lower extremity edema.  Respiratory: Clear to auscultation bilaterally. Not using accessory muscles, speaking in full sentences. Extremities-shoulder elbows and wrists with normal range of motion.Hands and fingers with normal range of motion and strength.   Impression and Recommendations:    Abnormal weight gain/BMI 39 -doing really well. Continue with contrast doing absolutely fantastic. Follow-up in 2 months for weight check with the nurse.  Cubital  tunnel syndrome - Dated for an elbow sleeve for him to wear at bedtime to keep from bending at the elbow. Encouraged him to try this for at least a month. If symptoms are improving then continue. If not will refer to Dr. Dianah Field for dissection of the nerve.  Thumb tendonitis - explained that the overuse of the thumbs is actually a form of tendinitis. Typically anti-inflammatories and rest and not using the game controller will improve this.

## 2016-07-27 ENCOUNTER — Other Ambulatory Visit: Payer: Self-pay | Admitting: Family Medicine

## 2016-07-27 DIAGNOSIS — R635 Abnormal weight gain: Secondary | ICD-10-CM

## 2016-07-29 ENCOUNTER — Other Ambulatory Visit: Payer: Self-pay | Admitting: Family Medicine

## 2016-07-29 DIAGNOSIS — R7301 Impaired fasting glucose: Secondary | ICD-10-CM

## 2016-07-29 DIAGNOSIS — S81001D Unspecified open wound, right knee, subsequent encounter: Secondary | ICD-10-CM

## 2016-07-29 DIAGNOSIS — I10 Essential (primary) hypertension: Secondary | ICD-10-CM

## 2016-07-29 DIAGNOSIS — S91001D Unspecified open wound, right ankle, subsequent encounter: Secondary | ICD-10-CM

## 2016-07-29 DIAGNOSIS — S81801D Unspecified open wound, right lower leg, subsequent encounter: Secondary | ICD-10-CM

## 2016-07-31 ENCOUNTER — Other Ambulatory Visit: Payer: Self-pay | Admitting: *Deleted

## 2016-07-31 MED ORDER — TAMSULOSIN HCL 0.4 MG PO CAPS: 0.4000 mg | ORAL_CAPSULE | Freq: Every day | ORAL | 1 refills | Status: DC

## 2016-08-01 ENCOUNTER — Emergency Department (INDEPENDENT_AMBULATORY_CARE_PROVIDER_SITE_OTHER)
Admission: EM | Admit: 2016-08-01 | Discharge: 2016-08-01 | Disposition: A | Discharge status: Home / Self Care | Payer: Managed Care, Other (non HMO) | Source: Home / Self Care | Attending: Family Medicine | Admitting: Family Medicine

## 2016-08-01 DIAGNOSIS — M25512 Pain in left shoulder: Secondary | ICD-10-CM | POA: Diagnosis not present

## 2016-08-01 DIAGNOSIS — M62838 Other muscle spasm: Secondary | ICD-10-CM

## 2016-08-01 DIAGNOSIS — R0789 Other chest pain: Secondary | ICD-10-CM

## 2016-08-01 MED ORDER — PREDNISONE 20 MG PO TABS: ORAL_TABLET | ORAL | 0 refills | Status: DC

## 2016-08-01 MED ORDER — KETOROLAC TROMETHAMINE 60 MG/2ML IM SOLN
60.0000 mg | Freq: Once | INTRAMUSCULAR | Status: AC
  Administered 2016-08-01: 60 mg via INTRAMUSCULAR

## 2016-08-01 MED ORDER — CYCLOBENZAPRINE HCL 10 MG PO TABS: 10.0000 mg | ORAL_TABLET | Freq: Two times a day (BID) | ORAL | 0 refills | Status: DC | PRN

## 2016-08-01 NOTE — ED Triage Notes (Signed)
Pt woke up with pain in the left shoulder this morning, and has progressively become worse.  This evening it is hurting so bad that he feels like he can not take a deep breath.

## 2016-08-01 NOTE — ED Provider Notes (Signed)
CSN: [id number]     Arrival date & time 08/01/16  1925 History   First MD Initiated Contact with Patient 08/01/16 1939     Chief Complaint  Patient presents with  . Shoulder Pain   (Consider location/radiation/quality/duration/timing/severity/associated sxs/prior Treatment) HPI  Bobby White is a 55 y.o. male presenting to UC with wife c/o sudden onset severe Left shoulder pain that started this morning when he woke up.  Pain is sharp, stabbing and cramping to upper back/Left shoulder that radiates into Left upper chest.  Pain is worse with movement of Left arm and deep breaths.  Pain is 8/10 at this time. No medication taken PTA. He states it does feel better putting pressure to muscle on Left upper back.  Pt and wife do note he changed a tire last night and wonders if he strained some muscles in his shoulder.  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  Pain has been constant since onset.     Past Medical History:  Diagnosis Date  . Arthritis   . Coronary artery disease   . GERD (gastroesophageal reflux disease)   . Hepatitis    Hep C --had to get shots  . Hypertension 1/10  . Impaired fasting glucose 2009  . Obesity   . Sleep apnea   . Varicose vein    Past Surgical History:  Procedure Laterality Date  . CARDIAC CATHETERIZATION     2012  . CORONARY ARTERY BYPASS GRAFT  07/25/11   3 vessel  . pins in right hand  1990's   4th and 5th digits-- removed 6 weeks afterwards  . TOTAL KNEE ARTHROPLASTY Right 09/06/2013   Dr Mayer Camel  . TOTAL KNEE ARTHROPLASTY Right 09/06/2013   Procedure: TOTAL KNEE ARTHROPLASTY;  Surgeon: Kerin Salen, MD;  Location: [facility];  Service: Orthopedics;  Laterality: Right;  . TOTAL KNEE ARTHROPLASTY Left 11/01/2013   DR Mayer Camel  . TOTAL KNEE ARTHROPLASTY Left 11/01/2013   Procedure: TOTAL KNEE ARTHROPLASTY- LEFT;  Surgeon: Kerin Salen, MD;  Location: [facility];  Service: Orthopedics;  Laterality:  Left;   Family History  Problem Relation Age of Onset  . Diabetes Mother   . Cancer Father     pancreatic   Social History  Substance Use Topics  . Smoking status: Former Smoker    Packs/day: 1.50    Years: 15.00    Types: Cigarettes    Quit date: 11/18/1996  . Smokeless tobacco: Never Used  . Alcohol use Yes     Comment: socially    Review of Systems  Constitutional: Negative for chills, diaphoresis, fatigue and fever.  Respiratory: Positive for shortness of breath (increased pain in Left shoulder with deep breath). Negative for cough, chest tightness and wheezing.   Cardiovascular: Positive for chest pain (Left upper). Negative for palpitations and leg swelling.  Gastrointestinal: Negative for diarrhea, nausea and vomiting.  Musculoskeletal: Positive for arthralgias, back pain (Left upper) and myalgias. Negative for joint swelling.  Neurological: Positive for numbness (occasional in Left arm- chronic per pt.). Negative for weakness.    Allergies  Poison ivy extract; Ace inhibitors; and Bactrim [sulfamethoxazole-trimethoprim]  Home Medications   Prior to Admission medications   Medication Sig Start Date End Date Taking? Authorizing Provider  albuterol (PROVENTIL HFA;VENTOLIN HFA) 108 (90 BASE) MCG/ACT inhaler Inhale 2 puffs into the lungs every 6 (six) hours as needed for wheezing.    Historical Provider,  MD  amLODipine (NORVASC) 5 MG tablet TAKE 1 TABLET DAILY 06/26/15   Hali Marry, MD  aspirin EC 325 MG tablet Take 1 tablet (325 mg total) by mouth 2 (two) times daily. 11/01/13   Leighton Parody, PA-C  atorvastatin (LIPITOR) 40 MG tablet Take 1 tablet (40 mg total) by mouth daily. 01/08/16   Hali Marry, MD  Cinnamon 500 MG capsule Take 500 mg by mouth 2 (two) times daily.    Historical Provider, MD  CONTRAVE 8-90 MG TB12 TAKE TWO TABLETS BY MOUTH TWICE DAILY. 07/29/16   Hali Marry, MD  Cyanocobalamin (B-12 PO) Take by mouth.    Historical Provider,  MD  cyclobenzaprine (FLEXERIL) 10 MG tablet Take 1 tablet (10 mg total) by mouth 2 (two) times daily as needed for muscle spasms. 08/01/16   Noland Fordyce, PA-C  ibuprofen (ADVIL,MOTRIN) 800 MG tablet Take 800 mg by mouth every 8 (eight) hours as needed (for pain).    Historical Provider, MD  KRILL OIL PO Take 1 capsule by mouth daily.    Historical Provider, MD  meloxicam (MOBIC) 15 MG tablet TAKE 1 TABLET DAILY 06/03/16   Hali Marry, MD  metoprolol succinate (TOPROL-XL) 25 MG 24 hr tablet TAKE 1 TABLET DAILY 07/30/16   Hali Marry, MD  nitroGLYCERIN (NITROSTAT) 0.4 MG SL tablet Place 1 tablet (0.4 mg total) under the tongue every 5 (five) minutes as needed for chest pain. 01/19/16 01/18/17  Hali Marry, MD  omeprazole (PRILOSEC) 40 MG capsule Take 1 capsule (40 mg total) by mouth daily. 02/06/16   Hali Marry, MD  predniSONE (DELTASONE) 20 MG tablet 3 tabs po day one, then 2 po daily x 4 days 08/01/16   Noland Fordyce, PA-C  tamsulosin (FLOMAX) 0.4 MG CAPS capsule Take 1 capsule (0.4 mg total) by mouth daily. 07/31/16   Hali Marry, MD   Meds Ordered and Administered this Visit   Medications  ketorolac (TORADOL) injection 60 mg (60 mg Intramuscular Given 08/01/16 1958)    BP 144/89 (BP Location: Left Arm)   Pulse 89   Temp 97.9 F (36.6 C) (Oral)   Ht 5\' 11"  (1.803 m)   Wt 280 lb (127 kg)   SpO2 97%   BMI 39.05 kg/m  No data found.   Physical Exam  Constitutional: He is oriented to person, place, and time. He appears well-developed and well-nourished.  Pt sitting in exam chair, holding and massaging Left upper back/Left shoulder  HENT:  Head: Normocephalic and atraumatic.  Eyes: EOM are normal.  Neck: Normal range of motion. Neck supple.  No midline spinal tenderness.  Cardiovascular: Normal rate and regular rhythm.   Pulmonary/Chest: Effort normal and breath sounds normal. No respiratory distress. He has no wheezes. He has no rales. He  exhibits tenderness ( Left upper chest).  Musculoskeletal: Normal range of motion. He exhibits tenderness. He exhibits no edema or deformity.       Arms: No midline spinal tenderness.  Left upper trapezius: trigger point noted with palpable muscle spasm. Full ROM Left arm with 5/5 strength  Neurological: He is alert and oriented to person, place, and time.  Skin: Skin is warm and dry. Capillary refill takes less than 2 seconds. He is not diaphoretic.  Psychiatric: He has a normal mood and affect. His behavior is normal.  Nursing note and vitals reviewed.   Urgent Care Course   Clinical Course    Procedures (including critical care time)  Labs Review Labs Reviewed - No data to display  Imaging Review No results found.  Date/Time:08/01/2016    19:51:55 Ventricular Rate: 87 PR Interval: 160 QRS Duration: 104 QT Interval: 382 QTC Calculation: 429 P-QRS-T: 47/10/90 Text Interpretation: Sinus rhythm, old inferior infarct, non-specific T-abnormality. Abnormal.  C/w prior EKGs   MDM   1. Left shoulder pain   2. Left-sided chest wall pain   3. Trapezius muscle spasm    Pt c/o sudden onset Left upper back pain/Left shoulder pain.   Due to wife's concern and pt hx, EKG was performed. Not concerning for new ACS.  Hx and exam c/w muscle spasm of trapezius. Tx in UC: toradol   Rx: Flexeril and prednisone (pt is on Contrave, no narcotic pain medication prescribed today)  Home care instructions provided. Encouraged massaging muscle, gentle stretching and alternating cool and warm compresses for comfort. Advised pain may take a few days to fully resolve. Encouraged f/u with PCP or Sports Medicine in 1 week if not improving. Patient verbalized understanding and agreement with treatment plan.    Noland Fordyce, PA-C 08/02/16 1022

## 2016-08-01 NOTE — Discharge Instructions (Signed)
°  Flexeril is a muscle relaxer and may cause drowsiness. Do not drink alcohol, drive, or operate heavy machinery while taking. ° °

## 2016-08-06 ENCOUNTER — Telehealth: Payer: Self-pay | Admitting: *Deleted

## 2016-08-06 NOTE — Telephone Encounter (Signed)
Callback: No answer, LMOM f/u from visit. Call back as needed. F/u with PCP or sports med as needed.

## 2016-08-30 ENCOUNTER — Other Ambulatory Visit: Payer: Self-pay | Admitting: Family Medicine

## 2016-08-30 DIAGNOSIS — R635 Abnormal weight gain: Secondary | ICD-10-CM

## 2016-09-25 ENCOUNTER — Ambulatory Visit (INDEPENDENT_AMBULATORY_CARE_PROVIDER_SITE_OTHER): Payer: Managed Care, Other (non HMO) | Admitting: Physician Assistant

## 2016-09-25 VITALS — BP 133/74 | HR 79 | Wt 288.0 lb

## 2016-09-25 DIAGNOSIS — R635 Abnormal weight gain: Secondary | ICD-10-CM | POA: Diagnosis not present

## 2016-09-25 DIAGNOSIS — Z23 Encounter for immunization: Secondary | ICD-10-CM | POA: Diagnosis not present

## 2016-09-25 NOTE — Progress Notes (Signed)
Patient is here for blood pressure and weight check. Denies any trouble sleeping, palpitations, or any other medication problems. Patient has not lost weight. Patient request to be weaned down off the [facility] Rx because he is "still craving sweets and snacks." Questions what other options he has for weight loss. No refills needed at this time, will route to Provider in office and PCP for review. While in clinic patient decided to get flu vaccination. Patient tolerated injection of flu immunization in right deltoid well, with no immediate complications. Advised to contact our office with any questions/concerns.   If wants to be weaned off can decrease by one tablet every week until off medications. Iran Planas PA-C

## 2016-09-26 NOTE — Progress Notes (Signed)
Will route to PCP for recommendation of alternative weight loss therapy. Then will advise Pt of how to wean off Contrave.

## 2016-09-26 NOTE — Progress Notes (Signed)
Called Pt, he has been on the Contrave for 3 months. Advised him of recommendation on how to wean down by cutting out one pill per day for a week until he's finally off Rx. Will route to PCP for recommendations.

## 2016-09-26 NOTE — Progress Notes (Signed)
How long did he actually take the Contave. It takes up to 3 months to reach steady state for that drug.

## 2016-09-30 NOTE — Progress Notes (Signed)
Pt has tried and failed Belviq in the past. He hit a plateau. Pt states what is he going to do is start working out at the gym. Pt is still going to wean off the Contrave. Will keep upcoming appt with PCP to discuss. No further questions.

## 2016-09-30 NOTE — Progress Notes (Signed)
He can call his insurance and see if they will cover either Belviq or Sexenda. And let me know.

## 2016-09-30 NOTE — Progress Notes (Signed)
Attempted to contact Pt, no answer and VM is full.  

## 2016-11-14 ENCOUNTER — Ambulatory Visit: Payer: Managed Care, Other (non HMO) | Admitting: Family Medicine

## 2016-11-19 ENCOUNTER — Ambulatory Visit (INDEPENDENT_AMBULATORY_CARE_PROVIDER_SITE_OTHER): Payer: Managed Care, Other (non HMO) | Admitting: Family Medicine

## 2016-11-19 ENCOUNTER — Encounter: Payer: Self-pay | Admitting: Family Medicine

## 2016-11-19 VITALS — BP 127/70 | HR 95 | Ht 71.0 in | Wt 299.0 lb

## 2016-11-19 DIAGNOSIS — R7301 Impaired fasting glucose: Secondary | ICD-10-CM

## 2016-11-19 DIAGNOSIS — R7309 Other abnormal glucose: Secondary | ICD-10-CM

## 2016-11-19 LAB — BASIC METABOLIC PANEL WITH GFR
BUN: 17 mg/dL (ref 7–25)
CO2: 30 mmol/L (ref 20–31)
Calcium: 9.1 mg/dL (ref 8.6–10.3)
Chloride: 104 mmol/L (ref 98–110)
Creat: 0.74 mg/dL (ref 0.70–1.33)
GFR, Est Non African American: >89 mL/min (ref >=60)
GLUCOSE: 107 mg/dL — AB (ref 65–99)
POTASSIUM: 4.3 mmol/L (ref 3.5–5.3)
Sodium: 141 mmol/L (ref 135–146)

## 2016-11-19 LAB — POCT GLYCOSYLATED HEMOGLOBIN (HGB A1C): Hemoglobin A1C: 5.5

## 2016-11-19 MED ORDER — NITROGLYCERIN 0.4 MG SL SUBL: 0.4000 mg | SUBLINGUAL_TABLET | SUBLINGUAL | 99 refills | Status: DC | PRN

## 2016-11-19 NOTE — Addendum Note (Signed)
Addended by: Teddy Spike on: 11/19/2016 09:02 AM   Modules accepted: Orders

## 2016-11-19 NOTE — Progress Notes (Signed)
Subjective:    CC: Weight check   HPI:  Follow-up abnormal weight gain. Currently off of Contrave. He says it would work at the beginning and then he would really get increased hunger cravings and has decided not to take the medication. He says he knows he really just needs to get back to the gym that with most effective for him for weight loss. Unfortunately his weight is up 11 pounds from last office visit.  IFG - no inc thrist or urination.   He did say does does have a stress test scheduled for Friday.  Past medical history, Surgical history, Family history not pertinant except as noted below, Social history, Allergies, and medications have been entered into the medical record, reviewed, and corrections made.   Review of Systems: No fevers, chills, night sweats, weight loss, chest pain, or shortness of breath.   Objective:    General: Well Developed, well nourished, and in no acute distress.  Neuro: Alert and oriented x3, extra-ocular muscles intact, sensation grossly intact.  HEENT: Normocephalic, atraumatic  Skin: Warm and dry, no rashes. Cardiac: Regular rate and rhythm, no murmurs rubs or gallops, no lower extremity edema.  Respiratory: Clear to auscultation bilaterally. Not using accessory muscles, speaking in full sentences.   Impression and Recommendations:   Abnormal Weight gain - up 11 pounds. He does have a plan to get into the gym and start working on diet and exercise. We also discussed some additional strategies such as making sure he is getting adequate protein in the morning for breakfast and eating small amounts really day and hydrating well. For now we'll hold off on any prescription medication.  IFG - well controlled. Hemoglobin A1c of 5.5 today which is stable from a year ago. Recommend repeat in one year.

## 2016-11-20 NOTE — Progress Notes (Signed)
Call patient: Your Pap smear is normal. Repeat in 2-3 years.

## 2016-11-30 ENCOUNTER — Other Ambulatory Visit: Payer: Self-pay | Admitting: Family Medicine

## 2017-01-03 ENCOUNTER — Other Ambulatory Visit: Payer: Self-pay | Admitting: Family Medicine

## 2017-01-03 DIAGNOSIS — S81801D Unspecified open wound, right lower leg, subsequent encounter: Secondary | ICD-10-CM

## 2017-01-03 DIAGNOSIS — S91001D Unspecified open wound, right ankle, subsequent encounter: Secondary | ICD-10-CM

## 2017-01-03 DIAGNOSIS — R7301 Impaired fasting glucose: Secondary | ICD-10-CM

## 2017-01-03 DIAGNOSIS — I10 Essential (primary) hypertension: Secondary | ICD-10-CM

## 2017-01-03 DIAGNOSIS — S81001D Unspecified open wound, right knee, subsequent encounter: Secondary | ICD-10-CM

## 2017-01-13 ENCOUNTER — Other Ambulatory Visit: Payer: Self-pay | Admitting: Family Medicine

## 2017-02-18 ENCOUNTER — Telehealth: Payer: Self-pay

## 2017-02-18 MED ORDER — AMOXICILLIN 500 MG PO TABS: ORAL_TABLET | ORAL | 0 refills | Status: DC

## 2017-02-18 NOTE — Telephone Encounter (Signed)
Rx sent in

## 2017-02-18 NOTE — Telephone Encounter (Signed)
Pt is requesting an antibiotic for oral surgery that he is having tomorrow morning.  Please advise.

## 2017-02-18 NOTE — Telephone Encounter (Signed)
Notified patient.

## 2017-02-22 ENCOUNTER — Other Ambulatory Visit: Payer: Self-pay | Admitting: Family Medicine

## 2017-03-17 ENCOUNTER — Telehealth: Payer: Self-pay

## 2017-03-17 ENCOUNTER — Other Ambulatory Visit: Payer: Self-pay

## 2017-03-17 MED ORDER — AMOXICILLIN 500 MG PO TABS: ORAL_TABLET | ORAL | 0 refills | Status: DC

## 2017-03-17 NOTE — Telephone Encounter (Signed)
Pt called for antibiotics for oral procedure.  Spoke with Kenney Houseman, refill sent.

## 2017-03-17 NOTE — Telephone Encounter (Signed)
Agree with below. \Bobby Brimage, MD  

## 2017-05-18 NOTE — Progress Notes (Signed)
Subjective:    CC: HTN  HPI: Hypertension- Pt denies chest pain, SOB, dizziness, or heart palpitations.  Taking meds as directed w/o problems.  Denies medication side effects.    Impaired fasting glucose-no increased thirst or urination. No symptoms consistent with hypoglycemia.  OSA - just went for yearly checkup for CPAP compliance.      BMI 41 - He has gained 4 lbs.  He is at a point where he's been frustrated with his weight. He's going to get on track with diet and exercise and in fact he and his wife agree that they would start going to the gym as of today. He says his goal is to get back down to at least 250 pounds.  Coronary Artery disease-status post bypass surgery-no recent chest pain or shortness of breath.  He also complains of bilateral thumb pain over the PIP and DIPs in particular. He denies any swelling or redness of the joints but says that they just feel achy and sore. He does a lot of manual work with his hands though.   Past medical history, Surgical history, Family history not pertinant except as noted below, Social history, Allergies, and medications have been entered into the medical record, reviewed, and corrections made.   Review of Systems: No fevers, chills, night sweats, weight loss, chest pain, or shortness of breath.   Objective:    General: Well Developed, well nourished, and in no acute distress.  Neuro: Alert and oriented x3, extra-ocular muscles intact, sensation grossly intact.  HEENT: Normocephalic, atraumatic  Skin: Warm and dry, no rashes. Cardiac: Regular rate and rhythm, no murmurs rubs or gallops, no lower extremity edema.  Respiratory: Clear to auscultation bilaterally. Not using accessory muscles, speaking in full sentences. Muscular skeletal: Thumbs with normal range of motion and strength. No swelling induration or redness of the joints.   Impression and Recommendations:    HTN - Well controlled. Continue current regimen. Due for CMP and  lipid panel. Follow-up in 6 months.  IFG - A1c of 5.4 today. Well controlled. Repeat in 6 months.  Thumb pain  - recommend start with x-rays. Will call with results once available. May be a candidate for a topical NSAID or maybe even injections if desired.  BMI 41-encourage regular exercise. He plans on starting this week. Really encourage him to work on portion control and reducing carbohydrate intake.

## 2017-05-19 ENCOUNTER — Ambulatory Visit (INDEPENDENT_AMBULATORY_CARE_PROVIDER_SITE_OTHER): Payer: 59

## 2017-05-19 ENCOUNTER — Encounter: Payer: Self-pay | Admitting: Family Medicine

## 2017-05-19 ENCOUNTER — Ambulatory Visit (INDEPENDENT_AMBULATORY_CARE_PROVIDER_SITE_OTHER): Payer: 59 | Admitting: Family Medicine

## 2017-05-19 VITALS — BP 132/85 | HR 86 | Resp 16 | Wt 303.0 lb

## 2017-05-19 DIAGNOSIS — M79646 Pain in unspecified finger(s): Secondary | ICD-10-CM

## 2017-05-19 DIAGNOSIS — Z6841 Body Mass Index (BMI) 40.0 and over, adult: Secondary | ICD-10-CM | POA: Diagnosis not present

## 2017-05-19 DIAGNOSIS — M25542 Pain in joints of left hand: Secondary | ICD-10-CM | POA: Diagnosis not present

## 2017-05-19 DIAGNOSIS — M25541 Pain in joints of right hand: Secondary | ICD-10-CM | POA: Diagnosis not present

## 2017-05-19 DIAGNOSIS — R7301 Impaired fasting glucose: Secondary | ICD-10-CM

## 2017-05-19 DIAGNOSIS — I10 Essential (primary) hypertension: Secondary | ICD-10-CM

## 2017-05-19 LAB — COMPLETE METABOLIC PANEL WITH GFR
ALT: 26 U/L (ref 9–46)
AST: 19 U/L (ref 10–35)
Albumin: 4.5 g/dL (ref 3.6–5.1)
Alkaline Phosphatase: 95 U/L (ref 40–115)
BUN: 18 mg/dL (ref 7–25)
CALCIUM: 9.3 mg/dL (ref 8.6–10.3)
CHLORIDE: 103 mmol/L (ref 98–110)
CO2: 27 mmol/L (ref 20–31)
Creat: 0.81 mg/dL (ref 0.70–1.33)
Glucose, Bld: 98 mg/dL (ref 65–99)
Potassium: 4.3 mmol/L (ref 3.5–5.3)
Sodium: 141 mmol/L (ref 135–146)
Total Bilirubin: 0.8 mg/dL (ref 0.2–1.2)
Total Protein: 6.7 g/dL (ref 6.1–8.1)

## 2017-05-19 LAB — LIPID PANEL
CHOLESTEROL: 179 mg/dL (ref <200)
HDL: 39 mg/dL — AB (ref >40)
LDL CALC: 102 mg/dL — AB (ref <100)
TRIGLYCERIDES: 191 mg/dL — AB (ref <150)
Total CHOL/HDL Ratio: 4.6 Ratio (ref <5.0)
VLDL: 38 mg/dL — AB (ref <30)

## 2017-05-19 LAB — POCT GLYCOSYLATED HEMOGLOBIN (HGB A1C): Hemoglobin A1C: 5.4

## 2017-05-20 ENCOUNTER — Other Ambulatory Visit: Payer: Self-pay | Admitting: Family Medicine

## 2017-05-20 MED ORDER — ATORVASTATIN CALCIUM 40 MG PO TABS: 40.0000 mg | ORAL_TABLET | Freq: Every day | ORAL | 3 refills | Status: DC

## 2017-05-23 ENCOUNTER — Other Ambulatory Visit: Payer: Self-pay | Admitting: Family Medicine

## 2017-06-03 ENCOUNTER — Encounter: Payer: Self-pay | Admitting: Sports Medicine

## 2017-06-03 ENCOUNTER — Ambulatory Visit (INDEPENDENT_AMBULATORY_CARE_PROVIDER_SITE_OTHER): Payer: 59 | Admitting: Sports Medicine

## 2017-06-03 DIAGNOSIS — M180 Bilateral primary osteoarthritis of first carpometacarpal joints: Secondary | ICD-10-CM | POA: Insufficient documentation

## 2017-06-03 NOTE — Progress Notes (Signed)
   Subjective:    I'm seeing this patient as a consultation for:  Dr. Beatrice Lecher   CC: Bilateral hand pain  HPI: For months this 56 year old male has had pain that he localizes at the base of the thumbs bilaterally, right worse than left, to the point where he gets cramping all the way up his forearm. Pain is moderate, worsening, no trauma, or constitutional symptoms.  Past medical history, Surgical history, Family history not pertinant except as noted below, Social history, Allergies, and medications have been entered into the medical record, reviewed, and no changes needed.   Review of Systems: No headache, visual changes, nausea, vomiting, diarrhea, constipation, dizziness, abdominal pain, skin rash, fevers, chills, night sweats, weight loss, swollen lymph nodes, body aches, joint swelling, muscle aches, chest pain, shortness of breath, mood changes, visual or auditory hallucinations.   Objective:   General: Well Developed, well nourished, and in no acute distress.  Neuro:  Extra-ocular muscles intact, able to move all 4 extremities, sensation grossly intact.  Deep tendon reflexes tested were normal. Psych: Alert and oriented, mood congruent with affect. ENT:  Ears and nose appear unremarkable.  Hearing grossly normal. Neck: Unremarkable overall appearance, trachea midline.  No visible thyroid enlargement. Eyes: Conjunctivae and lids appear unremarkable.  Pupils equal and round. Skin: Warm and dry, no rashes noted.  Cardiovascular: Pulses palpable, no extremity edema. Hands: Tender to palpation of the thumb basal joint with reproduction of pain with terminal abduction of the thumb.  Procedure: Real-time Ultrasound Guided Injection of left thumb basal joint Device: GE Logiq E  Verbal informed consent obtained.  Time-out conducted.  Noted no overlying erythema, induration, or other signs of local infection.  Skin prepped in a sterile fashion.  Local anesthesia: Topical Ethyl  chloride.  With sterile technique and under real time ultrasound guidance:  1/2 mL kenalog 40, 1/2 mL lidocaine injected easily Completed without difficulty  Pain immediately resolved suggesting accurate placement of the medication.  Advised to call if fevers/chills, erythema, induration, drainage, or persistent bleeding.  Images permanently stored and available for review in the ultrasound unit.  Impression: Technically successful ultrasound guided injection.  Procedure: Real-time Ultrasound Guided Injection of right thumb basal joint Device: GE Logiq E  Verbal informed consent obtained.  Time-out conducted.  Noted no overlying erythema, induration, or other signs of local infection.  Skin prepped in a sterile fashion.  Local anesthesia: Topical Ethyl chloride.  With sterile technique and under real time ultrasound guidance:  1/2 mL kenalog 40, 1/2 mL lidocaine injected easily Completed without difficulty  Pain immediately resolved suggesting accurate placement of the medication.  Advised to call if fevers/chills, erythema, induration, drainage, or persistent bleeding.  Images permanently stored and available for review in the ultrasound unit.  Impression: Technically successful ultrasound guided injection.  Impression and Recommendations:   This case required medical decision making of moderate complexity.  Primary osteoarthritis of both first carpometacarpal joints Bilateral x-rays, bilateral injections as above, oral NSAIDs have been ineffective. Return to see me in one month.

## 2017-06-03 NOTE — Assessment & Plan Note (Signed)
Bilateral x-rays, bilateral injections as above, oral NSAIDs have been ineffective. Return to see me in one month.

## 2017-07-01 ENCOUNTER — Ambulatory Visit (INDEPENDENT_AMBULATORY_CARE_PROVIDER_SITE_OTHER): Payer: 59 | Admitting: Sports Medicine

## 2017-07-01 DIAGNOSIS — M180 Bilateral primary osteoarthritis of first carpometacarpal joints: Secondary | ICD-10-CM | POA: Diagnosis not present

## 2017-07-01 NOTE — Assessment & Plan Note (Signed)
Doing well 1 month after bilateral trapeziometacarpal joint injections, return as needed.

## 2017-07-01 NOTE — Progress Notes (Signed)
  Subjective:    CC: Follow-up  HPI: Trapeziometacarpal arthritis: Injected both sides at the last visit, doing extremely well.  Past medical history:  Negative.  See flowsheet/record as well for more information.  Surgical history: Negative.  See flowsheet/record as well for more information.  Family history: Negative.  See flowsheet/record as well for more information.  Social history: Negative.  See flowsheet/record as well for more information.  Allergies, and medications have been entered into the medical record, reviewed, and no changes needed.   Review of Systems: No fevers, chills, night sweats, weight loss, chest pain, or shortness of breath.   Objective:    General: Well Developed, well nourished, and in no acute distress.  Neuro: Alert and oriented x3, extra-ocular muscles intact, sensation grossly intact.  HEENT: Normocephalic, atraumatic, pupils equal round reactive to light, neck supple, no masses, no lymphadenopathy, thyroid nonpalpable.  Skin: Warm and dry, no rashes. Cardiac: Regular rate and rhythm, no murmurs rubs or gallops, no lower extremity edema.  Respiratory: Clear to auscultation bilaterally. Not using accessory muscles, speaking in full sentences.  Impression and Recommendations:    Primary osteoarthritis of both first carpometacarpal joints Doing well 1 month after bilateral trapeziometacarpal joint injections, return as needed.

## 2017-07-02 ENCOUNTER — Other Ambulatory Visit: Payer: Self-pay | Admitting: Family Medicine

## 2017-07-02 DIAGNOSIS — S81001D Unspecified open wound, right knee, subsequent encounter: Secondary | ICD-10-CM

## 2017-07-02 DIAGNOSIS — S91001D Unspecified open wound, right ankle, subsequent encounter: Secondary | ICD-10-CM

## 2017-07-02 DIAGNOSIS — S81801D Unspecified open wound, right lower leg, subsequent encounter: Secondary | ICD-10-CM

## 2017-07-02 DIAGNOSIS — I10 Essential (primary) hypertension: Secondary | ICD-10-CM

## 2017-07-02 DIAGNOSIS — R7301 Impaired fasting glucose: Secondary | ICD-10-CM

## 2017-07-12 ENCOUNTER — Other Ambulatory Visit: Payer: Self-pay | Admitting: Family Medicine

## 2017-08-21 ENCOUNTER — Other Ambulatory Visit: Payer: Self-pay | Admitting: Family Medicine

## 2017-08-22 ENCOUNTER — Encounter: Payer: Self-pay | Admitting: Family Medicine

## 2017-08-25 MED ORDER — MELOXICAM 15 MG PO TABS: 15.0000 mg | ORAL_TABLET | Freq: Every day | ORAL | 0 refills | Status: DC

## 2017-09-17 ENCOUNTER — Emergency Department (INDEPENDENT_AMBULATORY_CARE_PROVIDER_SITE_OTHER)
Admission: EM | Admit: 2017-09-17 | Discharge: 2017-09-17 | Disposition: A | Discharge status: Home / Self Care | Payer: 59 | Source: Home / Self Care | Attending: Emergency Medicine | Admitting: Emergency Medicine

## 2017-09-17 ENCOUNTER — Encounter: Payer: Self-pay | Admitting: Emergency Medicine

## 2017-09-17 DIAGNOSIS — R059 Cough, unspecified: Secondary | ICD-10-CM

## 2017-09-17 DIAGNOSIS — J40 Bronchitis, not specified as acute or chronic: Secondary | ICD-10-CM | POA: Diagnosis not present

## 2017-09-17 DIAGNOSIS — R05 Cough: Secondary | ICD-10-CM

## 2017-09-17 MED ORDER — AZITHROMYCIN 250 MG PO TABS: ORAL_TABLET | ORAL | 0 refills | Status: DC

## 2017-09-17 NOTE — ED Triage Notes (Signed)
Dry cough x10days

## 2017-09-17 NOTE — ED Provider Notes (Addendum)
[facility]    CSN: [id number] Arrival date & time: 09/17/17  1943     History   Chief Complaint Chief Complaint  Patient presents with  . Cough  Patient was in his usual state of health until October 19 when while traveling with his wife to [place] he became ill with headache congestion. This was associated with a severe burning sore throat. Following this he has had a persistent cough. His cough is productive of a yellowish phlegm at times but he has minimal production. He does not tolerate Tessalon Perles.He typically takes DayQuil for his congestion.  HPI Bobby White is a 56 y.o. male.   HPI  Past Medical History:  Diagnosis Date  . Arthritis   . Coronary artery disease   . GERD (gastroesophageal reflux disease)   . Hepatitis    Hep C --had to get shots  . Hypertension 1/10  . Impaired fasting glucose 2009  . Obesity   . Sleep apnea   . Varicose vein     Patient Active Problem List   Diagnosis Date Noted  . Primary osteoarthritis of both first carpometacarpal joints 06/03/2017  . BPH (benign prostatic hyperplasia) 04/26/2016  . Arthritis of left knee 11/01/2013  . Arthritis of knee, right 09/08/2013  . Obstructive sleep apnea 07/14/2013  . Left Achilles tendonitis 04/23/2013  . Low back pain 04/23/2013  . Osteoarthritis of both knees 03/19/2013  . Coronary atherosclerosis 09/06/2011  . 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  . Status post coronary artery bypass grafting 08/27/2011  . HYPERLIPIDEMIA 01/10/2011  . OBESITY, UNSPECIFIED 01/09/2011  . WEIGHT GAIN 01/09/2011  . BACK STRAIN 10/02/2010  . ADJUSTMENT DISORDER WITH DEPRESSED MOOD 03/31/2009  . HYPERTENSION, BENIGN ESSENTIAL 11/21/2008  . VARICOSE VEINS LOWER EXTREMITIES W/OTH COMPS 08/31/2008  . IMPAIRED FASTING GLUCOSE 08/31/2008  . COLONIC POLYPS 04/20/2008  . GERD 03/16/2008    Past Surgical History:  Procedure Laterality Date  . CARDIAC CATHETERIZATION      2012  . CORONARY ARTERY BYPASS GRAFT  07/25/11   3 vessel  . pins in right hand  1990's   4th and 5th digits-- removed 6 weeks afterwards  . TOTAL KNEE ARTHROPLASTY Right 09/06/2013   Dr Mayer Camel  . TOTAL KNEE ARTHROPLASTY Right 09/06/2013   Procedure: TOTAL KNEE ARTHROPLASTY;  Surgeon: Kerin Salen, MD;  Location: [place];  Service: Orthopedics;  Laterality: Right;  . TOTAL KNEE ARTHROPLASTY Left 11/01/2013   DR Mayer Camel  . TOTAL KNEE ARTHROPLASTY Left 11/01/2013   Procedure: TOTAL KNEE ARTHROPLASTY- LEFT;  Surgeon: Kerin Salen, MD;  Location: [place];  Service: Orthopedics;  Laterality: Left;       Home Medications    Prior to Admission medications   Medication Sig Start Date End Date Taking? Authorizing Provider  albuterol (PROVENTIL HFA;VENTOLIN HFA) 108 (90 BASE) MCG/ACT inhaler Inhale 2 puffs into the lungs every 6 (six) hours as needed for wheezing.    [provider]  amLODipine (NORVASC) 5 MG tablet TAKE 1 TABLET DAILY 06/26/15   Hali Marry, MD  aspirin EC 325 MG tablet Take 1 tablet (325 mg total) by mouth 2 (two) times daily. 11/01/13   Leighton Parody, PA-C  atorvastatin (LIPITOR) 40 MG tablet TAKE 1 TABLET DAILY 08/21/17   Hali Marry, MD  azithromycin (ZITHROMAX) 250 MG tablet Take 2 tabs PO x 1 dose, then 1 tab PO QD x 4 days 09/17/17   Darlyne Russian, MD  Cinnamon 500  MG capsule Take 500 mg by mouth 2 (two) times daily.    [provider]  cyclobenzaprine (FLEXERIL) 10 MG tablet Take 1 tablet (10 mg total) by mouth 2 (two) times daily as needed for muscle spasms. 08/01/16   Noe Gens, PA-C  Ginger, Zingiber officinalis, (GINGER ROOT) 250 MG CAPS Take by mouth.    [provider]  GLUCOMANNAN KONJAC ROOT PO Take 1,500 mg by mouth daily.    [provider]  ibuprofen (ADVIL,MOTRIN) 800 MG tablet Take 800 mg by mouth every 8 (eight) hours as needed (for pain).    [provider]  KRILL OIL PO Take 1 capsule by  mouth daily.    [provider]  meloxicam (MOBIC) 15 MG tablet Take 1 tablet (15 mg total) by mouth daily. 08/25/17   Hali Marry, MD  metoprolol succinate (TOPROL-XL) 25 MG 24 hr tablet TAKE 1 TABLET DAILY 07/02/17   Hali Marry, MD  nitroGLYCERIN (NITROSTAT) 0.4 MG SL tablet Place 1 tablet (0.4 mg total) under the tongue every 5 (five) minutes as needed for chest pain. 11/19/16 11/19/17  Hali Marry, MD  omeprazole (PRILOSEC) 40 MG capsule TAKE 1 CAPSULE DAILY 01/13/17   Hali Marry, MD  tamsulosin (FLOMAX) 0.4 MG CAPS capsule TAKE 1 CAPSULE DAILY 07/14/17   Hali Marry, MD  Turmeric 500 MG CAPS Take 1 capsule by mouth daily.    [provider]    Family History Family History  Problem Relation Age of Onset  . Diabetes Mother   . Cancer Father        pancreatic    Social History Social History  Substance Use Topics  . Smoking status: Former Smoker    Packs/day: 1.50    Years: 15.00    Types: Cigarettes    Quit date: 11/18/1996  . Smokeless tobacco: Never Used  . Alcohol use Yes     Comment: socially     Allergies   Poison ivy extract; Ace inhibitors; and Bactrim [sulfamethoxazole-trimethoprim]   Review of Systems Review of Systems  Constitutional: Negative.   HENT: Positive for congestion, sinus pain and sore throat.   Respiratory: Positive for cough.   Cardiovascular: Negative for chest pain.     Physical Exam Triage Vital Signs ED Triage Vitals  Enc Vitals Group     BP 09/17/17 1954 (!) 143/86     Pulse Rate 09/17/17 1954 75     Resp --      Temp 09/17/17 1954 98.6 F (37 C)     Temp Source 09/17/17 1954 Oral     SpO2 09/17/17 1954 97 %     Weight 09/17/17 1954 (!) 309 lb (140.2 kg)     Height 09/17/17 1954 5\' 11"  (1.803 m)     Head Circumference --      Peak Flow --      Pain Score 09/17/17 1955 0     Pain Loc --      Pain Edu? --      Excl. in [place]? --    No data found.   Updated Vital  Signs BP (!) 143/86 (BP Location: Left Arm)   Pulse 75   Temp 98.6 F (37 C) (Oral)   Ht 5\' 11"  (1.803 m)   Wt (!) 309 lb (140.2 kg)   SpO2 97%   BMI 43.10 kg/m   Visual Acuity Right Eye Distance:   Left Eye Distance:   Bilateral Distance:  Right Eye Near:   Left Eye Near:    Bilateral Near:     Physical Exam  Constitutional: He appears well-developed.  HENT:  Head: Normocephalic.  Neck: Normal range of motion.  Cardiovascular: Normal rate.   There is a healed bypass scar  Pulmonary/Chest:  Breath sounds are symmetrical. There is good air exchange. There are no wheezes heard. There are rhonchi present bilaterally. There is no area of dullness.     UC Treatments / Results  Labs (all labs ordered are listed, but only abnormal results are displayed) Labs Reviewed - No data to display  EKG  EKG Interpretation None       Radiology No results found.  Procedures Procedures (including critical care time)  Medications Ordered in UC Medications - No data to display   Initial Impression / Assessment and Plan / UC Course  I have reviewed the triage vital signs and the nursing notes.  Pertinent labs & imaging results that were available during my care of the patient were reviewed by me and considered in my medical decision making (see chart for details). I did go ahead and put the patient on antibiotics because of his history of heart disease and his cough has been minimally productive. He did have some rhonchi sounds on examination. Since his wife is also ill-defined underlying illness is still most likely viral.      Final Clinical Impressions(s) / UC Diagnoses   Final diagnoses:  Bronchitis  Cough    New Prescriptions New Prescriptions   AZITHROMYCIN (ZITHROMAX) 250 MG TABLET    Take 2 tabs PO x 1 dose, then 1 tab PO QD x 4 days     Controlled Substance Prescriptions [place] Controlled Substance Registry consulted? Not Applicable   Darlyne Russian,  MD 09/17/17 2038    Darlyne Russian, MD 09/17/17 2038    Darlyne Russian, MD 09/17/17 2038

## 2017-09-17 NOTE — Discharge Instructions (Signed)
Take DayQuil as needed. Take Mucinex DM twice a day. I have prescribed an antibioticZithromax to take. If no better in 2-3 days please return to clinic for a chest x-ray.

## 2017-09-30 ENCOUNTER — Other Ambulatory Visit: Payer: Self-pay | Admitting: Family Medicine

## 2017-09-30 DIAGNOSIS — I10 Essential (primary) hypertension: Secondary | ICD-10-CM

## 2017-09-30 DIAGNOSIS — S81801D Unspecified open wound, right lower leg, subsequent encounter: Secondary | ICD-10-CM

## 2017-09-30 DIAGNOSIS — S91001D Unspecified open wound, right ankle, subsequent encounter: Secondary | ICD-10-CM

## 2017-09-30 DIAGNOSIS — S81001D Unspecified open wound, right knee, subsequent encounter: Secondary | ICD-10-CM

## 2017-09-30 DIAGNOSIS — R7301 Impaired fasting glucose: Secondary | ICD-10-CM

## 2017-11-19 ENCOUNTER — Ambulatory Visit: Payer: Managed Care, Other (non HMO) | Admitting: Family Medicine

## 2017-11-19 ENCOUNTER — Other Ambulatory Visit: Payer: Self-pay | Admitting: Family Medicine

## 2017-11-19 NOTE — Progress Notes (Deleted)
Subjective:    CC: BP and glucose  HPI: Hypertension- Pt denies chest pain, SOB, dizziness, or heart palpitations.  Taking meds as directed w/o problems.  Denies medication side effects.    Impaired fasting glucose-no increased thirst or urination. No symptoms consistent with hypoglycemia.   Past medical history, Surgical history, Family history not pertinant except as noted below, Social history, Allergies, and medications have been entered into the medical record, reviewed, and corrections made.   Review of Systems: No fevers, chills, night sweats, weight loss, chest pain, or shortness of breath.   Objective:    General: Well Developed, well nourished, and in no acute distress.  Neuro: Alert and oriented x3, extra-ocular muscles intact, sensation grossly intact.  HEENT: Normocephalic, atraumatic  Skin: Warm and dry, no rashes. Cardiac: Regular rate and rhythm, no murmurs rubs or gallops, no lower extremity edema.  Respiratory: Clear to auscultation bilaterally. Not using accessory muscles, speaking in full sentences.   Impression and Recommendations:    HTN -   IFG -

## 2017-11-23 ENCOUNTER — Other Ambulatory Visit: Payer: Self-pay | Admitting: Family Medicine

## 2017-12-01 ENCOUNTER — Other Ambulatory Visit: Payer: Self-pay

## 2017-12-01 MED ORDER — MELOXICAM 15 MG PO TABS: 15.0000 mg | ORAL_TABLET | Freq: Every day | ORAL | 0 refills | Status: DC

## 2017-12-03 ENCOUNTER — Ambulatory Visit (INDEPENDENT_AMBULATORY_CARE_PROVIDER_SITE_OTHER): Payer: BLUE CROSS/BLUE SHIELD | Admitting: Family Medicine

## 2017-12-03 ENCOUNTER — Encounter: Payer: Self-pay | Admitting: Family Medicine

## 2017-12-03 VITALS — BP 134/65 | HR 77 | Ht 71.0 in | Wt 307.0 lb

## 2017-12-03 DIAGNOSIS — I2510 Atherosclerotic heart disease of native coronary artery without angina pectoris: Secondary | ICD-10-CM

## 2017-12-03 DIAGNOSIS — M180 Bilateral primary osteoarthritis of first carpometacarpal joints: Secondary | ICD-10-CM

## 2017-12-03 DIAGNOSIS — Z23 Encounter for immunization: Secondary | ICD-10-CM

## 2017-12-03 DIAGNOSIS — I10 Essential (primary) hypertension: Secondary | ICD-10-CM | POA: Diagnosis not present

## 2017-12-03 DIAGNOSIS — R7301 Impaired fasting glucose: Secondary | ICD-10-CM

## 2017-12-03 LAB — POCT GLYCOSYLATED HEMOGLOBIN (HGB A1C): HEMOGLOBIN A1C: 5.7

## 2017-12-03 LAB — BASIC METABOLIC PANEL WITH GFR
BUN: 19 mg/dL (ref 7–25)
CHLORIDE: 103 mmol/L (ref 98–110)
CO2: 30 mmol/L (ref 20–32)
Calcium: 9.1 mg/dL (ref 8.6–10.3)
Creat: 0.8 mg/dL (ref 0.70–1.33)
GFR, EST AFRICAN AMERICAN: 114 mL/min/{1.73_m2} (ref >=60)
GFR, Est Non African American: 98 mL/min/{1.73_m2} (ref >=60)
Glucose, Bld: 95 mg/dL (ref 65–99)
Potassium: 4.3 mmol/L (ref 3.5–5.3)
Sodium: 141 mmol/L (ref 135–146)

## 2017-12-03 NOTE — Assessment & Plan Note (Addendum)
5 months response to bilateral trapeziometacarpal joint injections, repeated today.  Return as needed.

## 2017-12-03 NOTE — Progress Notes (Signed)
Subjective:    CC: Bilateral hand pain  HPI: This is a pleasant 57 year old male, he comes in with bilateral pain in both hands, x-rays in the past of show trapeziometacarpal joint osteoarthritis, previous injections were 5 months ago, pain is localized, no radiation.  Oral NSAIDs are not effective.  Moderate, persistent.  I reviewed the past medical history, family history, social history, surgical history, and allergies today and no changes were needed.  Please see the problem list section below in epic for further details.  Past Medical History: Past Medical History:  Diagnosis Date  . Arthritis   . Coronary artery disease   . GERD (gastroesophageal reflux disease)   . Hepatitis    Hep C --had to get shots  . Hypertension 1/10  . Impaired fasting glucose 2009  . Obesity   . Sleep apnea   . Varicose vein    Past Surgical History: Past Surgical History:  Procedure Laterality Date  . CARDIAC CATHETERIZATION     2012  . CORONARY ARTERY BYPASS GRAFT  07/25/11   3 vessel  . pins in right hand  1990's   4th and 5th digits-- removed 6 weeks afterwards  . TOTAL KNEE ARTHROPLASTY Right 09/06/2013   Dr Mayer Camel  . TOTAL KNEE ARTHROPLASTY Right 09/06/2013   Procedure: TOTAL KNEE ARTHROPLASTY;  Surgeon: Kerin Salen, MD;  Location: [place];  Service: Orthopedics;  Laterality: Right;  . TOTAL KNEE ARTHROPLASTY Left 11/01/2013   DR Mayer Camel  . TOTAL KNEE ARTHROPLASTY Left 11/01/2013   Procedure: TOTAL KNEE ARTHROPLASTY- LEFT;  Surgeon: Kerin Salen, MD;  Location: [place];  Service: Orthopedics;  Laterality: Left;   Social History: Social History   Socioeconomic History  . Marital status: Married    Spouse name: None  . Number of children: None  . Years of education: None  . Highest education level: None  Social Needs  . Financial resource strain: None  . Food insecurity - worry: None  . Food insecurity - inability: None  . Transportation needs - medical: None  . Transportation  needs - non-medical: None  Occupational History  . None  Tobacco Use  . Smoking status: Former Smoker    Packs/day: 1.50    Years: 15.00    Pack years: 22.50    Types: Cigarettes    Last attempt to quit: 11/18/1996    Years since quitting: 21.0  . Smokeless tobacco: Never Used  Substance and Sexual Activity  . Alcohol use: Yes    Comment: socially  . Drug use: Yes    Types: Marijuana    Comment: last time 2012 after his heart surgery  . Sexual activity: None  Other Topics Concern  . None  Social History Narrative  . None   Family History: Family History  Problem Relation Age of Onset  . Diabetes Mother   . Cancer Father        pancreatic   Allergies: Allergies  Allergen Reactions  . Poison Ivy Extract Itching and Rash  . Ace Inhibitors     REACTION: cough  . Bactrim [Sulfamethoxazole-Trimethoprim]    Medications: See med rec.  Review of Systems: No fevers, chills, night sweats, weight loss, chest pain, or shortness of breath.   Objective:    General: Well Developed, well nourished, and in no acute distress.  Neuro: Alert and oriented x3, extra-ocular muscles intact, sensation grossly intact.  HEENT: Normocephalic, atraumatic, pupils equal round reactive to light, neck supple, no masses, no lymphadenopathy, thyroid nonpalpable.  Skin: Warm and dry, no rashes. Cardiac: Regular rate and rhythm, no murmurs rubs or gallops, no lower extremity edema.  Respiratory: Clear to auscultation bilaterally. Not using accessory muscles, speaking in full sentences. Hands: Tender to palpation at the trapeziometacarpal joints.  Procedure: Real-time Ultrasound Guided Injection of left trapeziometacarpal joint. Device: GE Logiq E  Verbal informed consent obtained.  Time-out conducted.  Noted no overlying erythema, induration, or other signs of local infection.  Skin prepped in a sterile fashion.  Local anesthesia: Topical Ethyl chloride.  With sterile technique and under real  time ultrasound guidance: 1/2 cc kenalog 40, 1/2 cc lidocaine injected easily Completed without difficulty  Pain immediately resolved suggesting accurate placement of the medication.  Advised to call if fevers/chills, erythema, induration, drainage, or persistent bleeding.  Images permanently stored and available for review in the ultrasound unit.  Impression: Technically successful ultrasound guided injection.  Procedure: Real-time Ultrasound Guided Injection of right trapeziometacarpal joint. Device: GE Logiq E  Verbal informed consent obtained.  Time-out conducted.  Noted no overlying erythema, induration, or other signs of local infection.  Skin prepped in a sterile fashion.  Local anesthesia: Topical Ethyl chloride.  With sterile technique and under real time ultrasound guidance: 1/2 cc kenalog 40, 1/2 cc lidocaine injected easily Completed without difficulty  Pain immediately resolved suggesting accurate placement of the medication.  Advised to call if fevers/chills, erythema, induration, drainage, or persistent bleeding.  Images permanently stored and available for review in the ultrasound unit.  Impression: Technically successful ultrasound guided injection.  Impression and Recommendations:    Primary osteoarthritis of both first carpometacarpal joints 5 months response to bilateral trapeziometacarpal joint injections, repeated today.  Return as needed.  ___________________________________________ Gwen Her. Dianah Field, M.D., ABFM., CAQSM. Primary Care and [place] Instructor of [employer] [facility] [employer] Medicine

## 2017-12-03 NOTE — Progress Notes (Signed)
Subjective:    CC: BP, glucose  HPI:  Hypertension- Pt denies chest pain, SOB, dizziness, or heart palpitations.  Taking meds as directed w/o problems.  Denies medication side effects.    Impaired fasting glucose-no increased thirst or urination. No symptoms consistent with hypoglycemia.  He is still taking his cinnamon capsules.  He still continues to have bilateral thumb joint pain at the [facility] joint.  He did receive injections with Dr. Dianah Field back in August. He would like to do so again today.  He is taking turmeric for joint pain now.  Coronary atherosclerosis-he did have a stress test about a month ago which was normal.  He is still taking his Lipitor 40 mg and says he really plans on working on improving his diet and exercising more regularly.  Past medical history, Surgical history, Family history not pertinant except as noted below, Social history, Allergies, and medications have been entered into the medical record, reviewed, and corrections made.   Review of Systems: No fevers, chills, night sweats, weight loss, chest pain, or shortness of breath.   Objective:    General: Well Developed, well nourished, and in no acute distress.  Neuro: Alert and oriented x3, extra-ocular muscles intact, sensation grossly intact.  HEENT: Normocephalic, atraumatic  Skin: Warm and dry, no rashes. Cardiac: Regular rate and rhythm, no murmurs rubs or gallops, no lower extremity edema.  Respiratory: Clear to auscultation bilaterally. Not using accessory muscles, speaking in full sentences.   Impression and Recommendations:    HTN - Well controlled. Continue current regimen. Follow up in  62months.    IFG -hemoglobin A1c up to slightly to 5.7 this time.  Again continue to work on healthy diet and regular exercise in addition to taking cinnamon.  Osteoarthritis of bilateral [facility] joints-we will refer back to [facility] for joint injections.  Coronary atherosclerosis-negative stress test  recently.  Continue statin.  Flu vaccine given today.

## 2017-12-04 NOTE — Progress Notes (Signed)
All labs are normal. 

## 2017-12-19 ENCOUNTER — Other Ambulatory Visit: Payer: Self-pay | Admitting: *Deleted

## 2017-12-19 DIAGNOSIS — S81001D Unspecified open wound, right knee, subsequent encounter: Secondary | ICD-10-CM

## 2017-12-19 DIAGNOSIS — S81801D Unspecified open wound, right lower leg, subsequent encounter: Secondary | ICD-10-CM

## 2017-12-19 DIAGNOSIS — S91001D Unspecified open wound, right ankle, subsequent encounter: Secondary | ICD-10-CM

## 2017-12-19 DIAGNOSIS — R7301 Impaired fasting glucose: Secondary | ICD-10-CM

## 2017-12-19 DIAGNOSIS — I10 Essential (primary) hypertension: Secondary | ICD-10-CM

## 2017-12-19 MED ORDER — METOPROLOL SUCCINATE ER 25 MG PO TB24: 25.0000 mg | ORAL_TABLET | Freq: Every day | ORAL | 1 refills | Status: DC

## 2017-12-29 ENCOUNTER — Other Ambulatory Visit: Payer: Self-pay | Admitting: Family Medicine

## 2017-12-29 DIAGNOSIS — I10 Essential (primary) hypertension: Secondary | ICD-10-CM

## 2017-12-29 DIAGNOSIS — R7301 Impaired fasting glucose: Secondary | ICD-10-CM

## 2017-12-29 DIAGNOSIS — S81801D Unspecified open wound, right lower leg, subsequent encounter: Secondary | ICD-10-CM

## 2017-12-29 DIAGNOSIS — S81001D Unspecified open wound, right knee, subsequent encounter: Secondary | ICD-10-CM

## 2017-12-29 DIAGNOSIS — S91001D Unspecified open wound, right ankle, subsequent encounter: Secondary | ICD-10-CM

## 2018-01-08 ENCOUNTER — Other Ambulatory Visit: Payer: Self-pay | Admitting: Family Medicine

## 2018-01-14 ENCOUNTER — Encounter: Payer: Self-pay | Admitting: Family Medicine

## 2018-01-14 ENCOUNTER — Ambulatory Visit (INDEPENDENT_AMBULATORY_CARE_PROVIDER_SITE_OTHER): Payer: BLUE CROSS/BLUE SHIELD | Admitting: Family Medicine

## 2018-01-14 VITALS — BP 166/81 | HR 65 | Temp 97.8°F | Wt 300.0 lb

## 2018-01-14 DIAGNOSIS — R059 Cough, unspecified: Secondary | ICD-10-CM

## 2018-01-14 DIAGNOSIS — H9201 Otalgia, right ear: Secondary | ICD-10-CM | POA: Diagnosis not present

## 2018-01-14 DIAGNOSIS — R05 Cough: Secondary | ICD-10-CM

## 2018-01-14 DIAGNOSIS — J40 Bronchitis, not specified as acute or chronic: Secondary | ICD-10-CM | POA: Diagnosis not present

## 2018-01-14 MED ORDER — GUAIFENESIN-CODEINE 100-10 MG/5ML PO SOLN: 5.0000 mL | Freq: Four times a day (QID) | ORAL | 0 refills | Status: DC | PRN

## 2018-01-14 MED ORDER — IPRATROPIUM BROMIDE 0.06 % NA SOLN: 2.0000 | NASAL | 6 refills | Status: DC | PRN

## 2018-01-14 MED ORDER — AZITHROMYCIN 250 MG PO TABS: 250.0000 mg | ORAL_TABLET | Freq: Every day | ORAL | 0 refills | Status: DC

## 2018-01-14 MED ORDER — PREDNISONE 10 MG PO TABS
30.0000 mg | ORAL_TABLET | Freq: Every day | ORAL | 0 refills | Status: DC
Start: 2018-01-14 — End: 2018-06-02

## 2018-01-14 NOTE — Patient Instructions (Signed)
Thank you for coming in today. Continue over the counter medicine.  Take prednisone and use the nasal spray.  Use the cough medicine as needed at night.  Call or go to the emergency room if you get worse, have trouble breathing, have chest pains, or palpitations.    Cough, Adult A cough helps to clear your throat and lungs. A cough may last only 2-3 weeks (acute), or it may last longer than 8 weeks (chronic). Many different things can cause a cough. A cough may be a sign of an illness or another medical condition. Follow these instructions at home:  Pay attention to any changes in your cough.  Take medicines only as told by your doctor. ? If you were prescribed an antibiotic medicine, take it as told by your doctor. Do not stop taking it even if you start to feel better. ? Talk with your doctor before you try using a cough medicine.  Drink enough fluid to keep your pee (urine) clear or pale yellow.  If the air is dry, use a cold steam vaporizer or humidifier in your home.  Stay away from things that make you cough at work or at home.  If your cough is worse at night, try using extra pillows to raise your head up higher while you sleep.  Do not smoke, and try not to be around smoke. If you need help quitting, ask your doctor.  Do not have caffeine.  Do not drink alcohol.  Rest as needed. Contact a doctor if:  You have new problems (symptoms).  You cough up yellow fluid (pus).  Your cough does not get better after 2-3 weeks, or your cough gets worse.  Medicine does not help your cough and you are not sleeping well.  You have pain that gets worse or pain that is not helped with medicine.  You have a fever.  You are losing weight and you do not know why.  You have night sweats. Get help right away if:  You cough up blood.  You have trouble breathing.  Your heartbeat is very fast. This information is not intended to replace advice given to you by your health care  provider. Make sure you discuss any questions you have with your health care provider. Document Released: 07/18/2011 Document Revised: 04/11/2016 Document Reviewed: 01/11/2015 Elsevier Interactive Patient Education  [employer].

## 2018-01-14 NOTE — Progress Notes (Signed)
Bobby White is a 57 y.o. male who presents to [facility] [place] today for cough congestion ear pain wheezing.  The patient notes a 2-week history of cough congestion wheezing facial pain and pressure and right-sided ear pain.  The cough is nonproductive but does interfere with sleep and at work sometimes.  He is tried multiple over-the-counter medications which have not been very helpful.  He denies chest pain or vomiting diarrhea or abdominal pain.  No significant shortness of breath.  He is tried an albuterol inhaler which did not help much.  He has a smoking history but stopped smoking 20 years ago.  He denies any personal history of being diagnosed with asthma or COPD.  He notes that he does tend to get recurrent bronchitis episodes.  He notes the current episode is consistent with prior episodes of bronchitis.   Past Medical History:  Diagnosis Date  . Arthritis   . Coronary artery disease   . GERD (gastroesophageal reflux disease)   . Hepatitis    Hep C --had to get shots  . Hypertension 1/10  . Impaired fasting glucose 2009  . Obesity   . Sleep apnea   . Varicose vein    Past Surgical History:  Procedure Laterality Date  . CARDIAC CATHETERIZATION     2012  . CORONARY ARTERY BYPASS GRAFT  07/25/11   3 vessel  . pins in right hand  1990's   4th and 5th digits-- removed 6 weeks afterwards  . TOTAL KNEE ARTHROPLASTY Right 09/06/2013   Dr Mayer Camel  . TOTAL KNEE ARTHROPLASTY Right 09/06/2013   Procedure: TOTAL KNEE ARTHROPLASTY;  Surgeon: Kerin Salen, MD;  Location: [facility];  Service: Orthopedics;  Laterality: Right;  . TOTAL KNEE ARTHROPLASTY Left 11/01/2013   DR Mayer Camel  . TOTAL KNEE ARTHROPLASTY Left 11/01/2013   Procedure: TOTAL KNEE ARTHROPLASTY- LEFT;  Surgeon: Kerin Salen, MD;  Location: [facility];  Service: Orthopedics;  Laterality: Left;   Social History    Tobacco Use  . Smoking status: Former Smoker    Packs/day: 1.50    Years: 15.00    Pack years: 22.50    Types: Cigarettes    Last attempt to quit: 11/18/1996    Years since quitting: 21.1  . Smokeless tobacco: Never Used  Substance Use Topics  . Alcohol use: Yes    Comment: socially   family history includes Cancer in his father; Diabetes in his mother.  ROS as above:  Medications: Current Outpatient Medications  Medication Sig Dispense Refill  . albuterol (PROVENTIL HFA;VENTOLIN HFA) 108 (90 BASE) MCG/ACT inhaler Inhale 2 puffs into the lungs every 6 (six) hours as needed for wheezing.    Marland Kitchen amLODipine (NORVASC) 5 MG tablet TAKE 1 TABLET DAILY 90 tablet 1  . aspirin EC 325 MG tablet Take 1 tablet (325 mg total) by mouth 2 (two) times daily. 30 tablet 0  . atorvastatin (LIPITOR) 40 MG tablet TAKE 1 TABLET DAILY 90 tablet 3  . Cinnamon 500 MG capsule Take 500 mg by mouth 2 (two) times daily.    . cyclobenzaprine (FLEXERIL) 10 MG tablet Take 1 tablet (10 mg total) by mouth 2 (two) times daily as needed for muscle spasms. 20 tablet 0  . Ginger, Zingiber officinalis, (GINGER ROOT) 250 MG CAPS Take by mouth.    Marland Kitchen GLUCOMANNAN KONJAC ROOT PO Take 1,500 mg by mouth daily.    Marland Kitchen ibuprofen (ADVIL,MOTRIN) 800 MG  tablet Take 800 mg by mouth every 8 (eight) hours as needed (for pain).    Marland Kitchen KRILL OIL PO Take 1 capsule by mouth daily.    . meloxicam (MOBIC) 15 MG tablet Take 1 tablet (15 mg total) by mouth daily. 90 tablet 0  . metoprolol succinate (TOPROL-XL) 25 MG 24 hr tablet TAKE 1 TABLET DAILY 90 tablet 1  . omeprazole (PRILOSEC) 40 MG capsule TAKE 1 CAPSULE DAILY 90 capsule 3  . tamsulosin (FLOMAX) 0.4 MG CAPS capsule TAKE 1 CAPSULE DAILY 90 capsule 1  . Turmeric 500 MG CAPS Take 1 capsule by mouth daily.    Marland Kitchen azithromycin (ZITHROMAX) 250 MG tablet Take 1 tablet (250 mg total) by mouth daily. Take first 2 tablets together, then 1 every day until finished. 6 tablet 0  . guaiFENesin-codeine  100-10 MG/5ML syrup Take 5 mLs by mouth every 6 (six) hours as needed for cough. 120 mL 0  . ipratropium (ATROVENT) 0.06 % nasal spray Place 2 sprays into both nostrils every 4 (four) hours as needed for rhinitis. 10 mL 6  . nitroGLYCERIN (NITROSTAT) 0.4 MG SL tablet Place 1 tablet (0.4 mg total) under the tongue every 5 (five) minutes as needed for chest pain. 12 tablet prn  . predniSONE (DELTASONE) 10 MG tablet Take 3 tablets (30 mg total) by mouth daily with breakfast. 15 tablet 0   No current facility-administered medications for this visit.    Allergies  Allergen Reactions  . Poison Ivy Extract Itching and Rash  . Ace Inhibitors     REACTION: cough  . Bactrim [Sulfamethoxazole-Trimethoprim]     Health Maintenance Health Maintenance  Topic Date Due  . HIV Screening  01/[birth year]/1974  . COLONOSCOPY  11/18/2014  . TETANUS/TDAP  04/20/2018  . INFLUENZA VACCINE  Completed  . Hepatitis C Screening  Completed     Exam:  BP (!) 166/81   Pulse 65   Temp 97.8 F (36.6 C) (Oral)   Wt 300 lb (136.1 kg)   SpO2 98%   BMI 41.84 kg/m  Gen: Well NAD HEENT: EOMI,  MMM clear nasal discharge.  Left tympanic membrane is retracted right is normal both are nonerythematous and nontender mastoids.  Posterior pharynx with cobblestoning.  Mild right-sided cervical lymphadenopathy present Lungs: Normal work of breathing.  Coarse breath sounds and rhonchi present bilaterally Heart: RRR no MRG Abd: NABS, Soft. Nondistended, Nontender Exts: Brisk capillary refill, warm and well perfused.   Relatively normal chest x-ray from July 2015 reviewed.  I personally independently reviewed the images.  No bronchitic or emphysema like changes were present at that time.   Assessment and Plan: 57 y.o. male with  Cough congestion and ear pressure and pain are all likely related to viral illness.  The cough is persistent and worsening.  I am concerned that [place] may be developing bronchitis or possibly a COPD  exacerbation.  He has recurrent episodes of bronchitis and a smoking history.  He possibly has some reactive airway disease component.  Plan for symptom control with over-the-counter cough medications, Atrovent nasal spray, and codeine cough syrup at bedtime.  We will treat inflammation in the eustachian tubes as well as his lungs with prednisone.  Will use a printed prescription of azithromycin as backup.  Recommend patient follow-up with PCP for hypertension.  Health maintenance updated  No orders of the defined types were placed in this encounter.  Meds ordered this encounter  Medications  . predniSONE (DELTASONE) 10 MG tablet  Sig: Take 3 tablets (30 mg total) by mouth daily with breakfast.    Dispense:  15 tablet    Refill:  0  . guaiFENesin-codeine 100-10 MG/5ML syrup    Sig: Take 5 mLs by mouth every 6 (six) hours as needed for cough.    Dispense:  120 mL    Refill:  0  . ipratropium (ATROVENT) 0.06 % nasal spray    Sig: Place 2 sprays into both nostrils every 4 (four) hours as needed for rhinitis.    Dispense:  10 mL    Refill:  6  . azithromycin (ZITHROMAX) 250 MG tablet    Sig: Take 1 tablet (250 mg total) by mouth daily. Take first 2 tablets together, then 1 every day until finished.    Dispense:  6 tablet    Refill:  0     Discussed warning signs or symptoms. Please see discharge instructions. Patient expresses understanding.  Patient researched [facility] Controlled Substance Reporting System.

## 2018-01-21 ENCOUNTER — Encounter: Payer: Self-pay | Admitting: Family Medicine

## 2018-02-13 ENCOUNTER — Encounter: Payer: Self-pay | Admitting: Family Medicine

## 2018-02-13 MED ORDER — TAMSULOSIN HCL 0.4 MG PO CAPS: 0.4000 mg | ORAL_CAPSULE | Freq: Every day | ORAL | 1 refills | Status: DC

## 2018-05-03 ENCOUNTER — Other Ambulatory Visit: Payer: Self-pay | Admitting: Family Medicine

## 2018-05-11 DIAGNOSIS — G4733 Obstructive sleep apnea (adult) (pediatric): Secondary | ICD-10-CM | POA: Diagnosis not present

## 2018-05-11 DIAGNOSIS — I10 Essential (primary) hypertension: Secondary | ICD-10-CM | POA: Diagnosis not present

## 2018-05-11 DIAGNOSIS — Z6841 Body Mass Index (BMI) 40.0 and over, adult: Secondary | ICD-10-CM | POA: Diagnosis not present

## 2018-06-02 ENCOUNTER — Encounter: Payer: Self-pay | Admitting: Family Medicine

## 2018-06-02 ENCOUNTER — Ambulatory Visit (INDEPENDENT_AMBULATORY_CARE_PROVIDER_SITE_OTHER): Payer: BLUE CROSS/BLUE SHIELD | Admitting: Family Medicine

## 2018-06-02 VITALS — BP 119/62 | HR 64 | Ht 71.0 in | Wt 288.0 lb

## 2018-06-02 DIAGNOSIS — Z114 Encounter for screening for human immunodeficiency virus [HIV]: Secondary | ICD-10-CM | POA: Diagnosis not present

## 2018-06-02 DIAGNOSIS — I10 Essential (primary) hypertension: Secondary | ICD-10-CM | POA: Diagnosis not present

## 2018-06-02 DIAGNOSIS — Z125 Encounter for screening for malignant neoplasm of prostate: Secondary | ICD-10-CM | POA: Diagnosis not present

## 2018-06-02 DIAGNOSIS — R7301 Impaired fasting glucose: Secondary | ICD-10-CM | POA: Diagnosis not present

## 2018-06-02 DIAGNOSIS — Z23 Encounter for immunization: Secondary | ICD-10-CM

## 2018-06-02 LAB — POCT GLYCOSYLATED HEMOGLOBIN (HGB A1C): HEMOGLOBIN A1C: 5.4 % (ref 4.0–5.6)

## 2018-06-02 MED ORDER — NITROGLYCERIN 0.4 MG SL SUBL: 0.4000 mg | SUBLINGUAL_TABLET | SUBLINGUAL | 99 refills | Status: DC | PRN

## 2018-06-02 NOTE — Progress Notes (Signed)
Subjective:    CC: BP and glucose  HPI:  Impaired fasting glucose-no increased thirst or urination. No symptoms consistent with hypoglycemia. Has lost 22 lbs. has been working out of the gym 2 to 3 days/week.  He has been taking some supplements as well.  Hypertension- Pt denies chest pain, SOB, dizziness, or heart palpitations.  Taking meds as directed w/o problems.  Denies medication side effects.    F/U hyperlipidemia -  Doing well on statin. No S.E.   Past medical history, Surgical history, Family history not pertinant except as noted below, Social history, Allergies, and medications have been entered into the medical record, reviewed, and corrections made.   Review of Systems: No fevers, chills, night sweats, weight loss, chest pain, or shortness of breath.   Objective:    General: Well Developed, well nourished, and in no acute distress.  Neuro: Alert and oriented x3, extra-ocular muscles intact, sensation grossly intact.  HEENT: Normocephalic, atraumatic  Skin: Warm and dry, no rashes. Cardiac: Regular rate and rhythm, no murmurs rubs or gallops, no lower extremity edema.  Respiratory: Clear to auscultation bilaterally. Not using accessory muscles, speaking in full sentences.   Impression and Recommendations:    IFG - A1c of 5.4. Well controlled. Continue current regimen. Follow up in  6-12 months.    HTN - Well controlled. Continue current regimen. Follow up in  67months.    BMI 40 - doing a great job with weight loss and exercise!!!!!   Hyperlipidemia-due to recheck lipid panel.  Tdap given today.   Due for screening PSA.

## 2018-06-02 NOTE — Addendum Note (Signed)
Addended by: Teddy Spike on: 06/02/2018 09:28 AM   Modules accepted: Orders

## 2018-06-03 DIAGNOSIS — I10 Essential (primary) hypertension: Secondary | ICD-10-CM | POA: Diagnosis not present

## 2018-06-03 DIAGNOSIS — Z125 Encounter for screening for malignant neoplasm of prostate: Secondary | ICD-10-CM | POA: Diagnosis not present

## 2018-06-03 DIAGNOSIS — Z114 Encounter for screening for human immunodeficiency virus [HIV]: Secondary | ICD-10-CM | POA: Diagnosis not present

## 2018-06-04 LAB — COMPLETE METABOLIC PANEL WITH GFR
AG Ratio: 2.1 (calc) (ref 1.0–2.5)
ALBUMIN MSPROF: 4.2 g/dL (ref 3.6–5.1)
ALT: 22 U/L (ref 9–46)
AST: 21 U/L (ref 10–35)
Alkaline phosphatase (APISO): 91 U/L (ref 40–115)
BUN/Creatinine Ratio: 29 (calc) — ABNORMAL HIGH (ref 6–22)
BUN: 26 mg/dL — ABNORMAL HIGH (ref 7–25)
CALCIUM: 9.1 mg/dL (ref 8.6–10.3)
CO2: 28 mmol/L (ref 20–32)
Chloride: 106 mmol/L (ref 98–110)
Creat: 0.89 mg/dL (ref 0.70–1.33)
GFR, EST AFRICAN AMERICAN: 108 mL/min/{1.73_m2} (ref >=60)
GFR, EST NON AFRICAN AMERICAN: 94 mL/min/{1.73_m2} (ref >=60)
GLOBULIN: 2 g/dL (ref 1.9–3.7)
Glucose, Bld: 98 mg/dL (ref 65–99)
Potassium: 4.6 mmol/L (ref 3.5–5.3)
SODIUM: 141 mmol/L (ref 135–146)
TOTAL PROTEIN: 6.2 g/dL (ref 6.1–8.1)
Total Bilirubin: 0.8 mg/dL (ref 0.2–1.2)

## 2018-06-04 LAB — LIPID PANEL
CHOLESTEROL: 106 mg/dL (ref <200)
HDL: 37 mg/dL — AB (ref >40)
LDL Cholesterol (Calc): 52 mg/dL (calc)
Non-HDL Cholesterol (Calc): 69 mg/dL (calc) (ref <130)
Total CHOL/HDL Ratio: 2.9 (calc) (ref <5.0)
Triglycerides: 83 mg/dL (ref <150)

## 2018-06-04 LAB — PSA: PSA: 1 ng/mL (ref <=4.0)

## 2018-06-04 LAB — HIV ANTIBODY (ROUTINE TESTING W REFLEX): HIV 1&2 Ab, 4th Generation: NONREACTIVE

## 2018-07-17 DIAGNOSIS — I2510 Atherosclerotic heart disease of native coronary artery without angina pectoris: Secondary | ICD-10-CM | POA: Diagnosis not present

## 2018-07-17 DIAGNOSIS — Z951 Presence of aortocoronary bypass graft: Secondary | ICD-10-CM | POA: Diagnosis not present

## 2018-07-17 DIAGNOSIS — E7800 Pure hypercholesterolemia, unspecified: Secondary | ICD-10-CM | POA: Diagnosis not present

## 2018-07-17 DIAGNOSIS — I10 Essential (primary) hypertension: Secondary | ICD-10-CM | POA: Diagnosis not present

## 2018-07-29 ENCOUNTER — Telehealth: Payer: Self-pay

## 2018-07-29 ENCOUNTER — Other Ambulatory Visit: Payer: Self-pay

## 2018-07-29 MED ORDER — TAMSULOSIN HCL 0.4 MG PO CAPS
0.8000 mg | ORAL_CAPSULE | Freq: Every day | ORAL | 1 refills | Status: DC
Start: 2018-07-29 — End: 2018-07-29

## 2018-07-29 MED ORDER — TAMSULOSIN HCL 0.4 MG PO CAPS
0.8000 mg | ORAL_CAPSULE | Freq: Every day | ORAL | 0 refills | Status: DC
Start: 2018-07-29 — End: 2019-01-27

## 2018-07-29 NOTE — Telephone Encounter (Signed)
Pt called stating that he has been on Tamsulosin 1 QD for a while. Recently, pt is having more issues as far as having to get up frequently during the night to urinate. Pt has increased to two capsules daily and feels it really helps.   Wanting to know if he can have a new RX written for his to take 2 QD

## 2018-07-29 NOTE — Telephone Encounter (Signed)
Sounds good.  Do not go above 2 capsules though.  Prescription placed but not ordered.  Please sign to whichever pharmacy he would prefer.  There are a couple different mail orders in the system.

## 2018-07-29 NOTE — Telephone Encounter (Signed)
Pt called- he spoke with his wife and turns out he is supposed to be using [facility] for mail order.   I have sent the RX to the correct pharmacy and I have taken all other pharmacies off pharmacy.   Pt has also requested a 30 day supply be sent to pharmacy to hold him over until mail order arrives.

## 2018-07-29 NOTE — Telephone Encounter (Signed)
[facility], [facility] sent

## 2018-08-06 DIAGNOSIS — G4733 Obstructive sleep apnea (adult) (pediatric): Secondary | ICD-10-CM | POA: Diagnosis not present

## 2018-08-17 ENCOUNTER — Encounter: Payer: Self-pay | Admitting: Sports Medicine

## 2018-08-17 ENCOUNTER — Ambulatory Visit (INDEPENDENT_AMBULATORY_CARE_PROVIDER_SITE_OTHER): Payer: BLUE CROSS/BLUE SHIELD | Admitting: Sports Medicine

## 2018-08-17 VITALS — BP 134/80 | HR 62 | Temp 97.6°F | Wt 288.0 lb

## 2018-08-17 DIAGNOSIS — Z23 Encounter for immunization: Secondary | ICD-10-CM

## 2018-08-17 DIAGNOSIS — M180 Bilateral primary osteoarthritis of first carpometacarpal joints: Secondary | ICD-10-CM | POA: Diagnosis not present

## 2018-08-17 NOTE — Progress Notes (Signed)
Subjective:    CC: Bilateral hand pain  HPI: This is a pleasant 57 year old male, he has trapeziometacarpal joint arthritis, worsening of pain over the last couple of weeks.  Previous injection was in January of this year.  Recurrence of pain, moderate, persistent, localized without radiation, both hands.  I reviewed the past medical history, family history, social history, surgical history, and allergies today and no changes were needed.  Please see the problem list section below in epic for further details.  Past Medical History: Past Medical History:  Diagnosis Date  . Arthritis   . Coronary artery disease   . GERD (gastroesophageal reflux disease)   . Hepatitis    Hep C --had to get shots  . Hypertension 1/10  . Impaired fasting glucose 2009  . Obesity   . Sleep apnea   . Varicose vein    Past Surgical History: Past Surgical History:  Procedure Laterality Date  . CARDIAC CATHETERIZATION     2012  . CORONARY ARTERY BYPASS GRAFT  07/25/11   3 vessel  . pins in right hand  1990's   4th and 5th digits-- removed 6 weeks afterwards  . TOTAL KNEE ARTHROPLASTY Right 09/06/2013   Dr Mayer Camel  . TOTAL KNEE ARTHROPLASTY Right 09/06/2013   Procedure: TOTAL KNEE ARTHROPLASTY;  Surgeon: Kerin Salen, MD;  Location: [facility];  Service: Orthopedics;  Laterality: Right;  . TOTAL KNEE ARTHROPLASTY Left 11/01/2013   DR Mayer Camel  . TOTAL KNEE ARTHROPLASTY Left 11/01/2013   Procedure: TOTAL KNEE ARTHROPLASTY- LEFT;  Surgeon: Kerin Salen, MD;  Location: [facility];  Service: Orthopedics;  Laterality: Left;   Social History: Social History   Socioeconomic History  . Marital status: Married    Spouse name: Not on file  . Number of children: Not on file  . Years of education: Not on file  . Highest education level: Not on file  Occupational History  . Not on file  Social Needs  . Financial resource strain: Not on file  . Food insecurity:    Worry: Not on file    Inability: Not on file  .  Transportation needs:    Medical: Not on file    Non-medical: Not on file  Tobacco Use  . Smoking status: Former Smoker    Packs/day: 1.50    Years: 15.00    Pack years: 22.50    Types: Cigarettes    Last attempt to quit: 11/18/1996    Years since quitting: 21.7  . Smokeless tobacco: Never Used  Substance and Sexual Activity  . Alcohol use: Yes    Comment: socially  . Drug use: Yes    Types: Marijuana    Comment: last time 2012 after his heart surgery  . Sexual activity: Not on file  Lifestyle  . Physical activity:    Days per week: Not on file    Minutes per session: Not on file  . Stress: Not on file  Relationships  . Social connections:    Talks on phone: Not on file    Gets together: Not on file    Attends religious service: Not on file    Active member of club or organization: Not on file    Attends meetings of clubs or organizations: Not on file    Relationship status: Not on file  Other Topics Concern  . Not on file  Social History Narrative  . Not on file   Family History: Family History  Problem Relation Age of Onset  .  Diabetes Mother   . Cancer Father        pancreatic   Allergies: Allergies  Allergen Reactions  . Poison Ivy Extract Itching and Rash  . Ace Inhibitors     REACTION: cough  . Bactrim [Sulfamethoxazole-Trimethoprim]    Medications: See med rec.  Review of Systems: No fevers, chills, night sweats, weight loss, chest pain, or shortness of breath.   Objective:    General: Well Developed, well nourished, and in no acute distress.  Neuro: Alert and oriented x3, extra-ocular muscles intact, sensation grossly intact.  HEENT: Normocephalic, atraumatic, pupils equal round reactive to light, neck supple, no masses, no lymphadenopathy, thyroid nonpalpable.  Skin: Warm and dry, no rashes. Cardiac: Regular rate and rhythm, no murmurs rubs or gallops, no lower extremity edema.  Respiratory: Clear to auscultation bilaterally. Not using accessory  muscles, speaking in full sentences.  Procedure: Real-time Ultrasound Guided Injection of left first [facility] Device: GE Logiq E  Verbal informed consent obtained.  Time-out conducted.  Noted no overlying erythema, induration, or other signs of local infection.  Skin prepped in a sterile fashion.  Local anesthesia: Topical Ethyl chloride.  With sterile technique and under real time ultrasound guidance: 1/2 cc kenalog 40, 1/2 cc lidocaine injected easily Completed without difficulty  Pain immediately resolved suggesting accurate placement of the medication.  Advised to call if fevers/chills, erythema, induration, drainage, or persistent bleeding.  Images permanently stored and available for review in the ultrasound unit.  Impression: Technically successful ultrasound guided injection.  Procedure: Real-time Ultrasound Guided Injection of right first [facility] Device: GE Logiq E  Verbal informed consent obtained.  Time-out conducted.  Noted no overlying erythema, induration, or other signs of local infection.  Skin prepped in a sterile fashion.  Local anesthesia: Topical Ethyl chloride.  With sterile technique and under real time ultrasound guidance: 1/2 cc kenalog 40, 1/2 cc lidocaine injected easily Completed without difficulty  Pain immediately resolved suggesting accurate placement of the medication.  Advised to call if fevers/chills, erythema, induration, drainage, or persistent bleeding.  Images permanently stored and available for review in the ultrasound unit.  Impression: Technically successful ultrasound guided injection.  Impression and Recommendations:    Primary osteoarthritis of both first carpometacarpal joints Bilateral carpal metacarpal injections as above, previous injection was in January 2019. ___________________________________________ Gwen Her. Dianah Field, M.D., ABFM., CAQSM. Primary Care and [place] Instructor of  [employer]

## 2018-08-17 NOTE — Assessment & Plan Note (Signed)
Bilateral carpal metacarpal injections as above, previous injection was in January 2019.

## 2018-08-31 ENCOUNTER — Encounter: Payer: Self-pay | Admitting: Sports Medicine

## 2018-08-31 ENCOUNTER — Ambulatory Visit (INDEPENDENT_AMBULATORY_CARE_PROVIDER_SITE_OTHER): Payer: BLUE CROSS/BLUE SHIELD | Admitting: Sports Medicine

## 2018-08-31 DIAGNOSIS — M170 Bilateral primary osteoarthritis of knee: Secondary | ICD-10-CM | POA: Diagnosis not present

## 2018-08-31 NOTE — Assessment & Plan Note (Signed)
New set of orthotics

## 2018-08-31 NOTE — Progress Notes (Signed)
    Patient was fitted for a : standard, cushioned, semi-rigid orthotic. The orthotic was heated and afterward the patient stood on the orthotic blank positioned on the orthotic stand. The patient was positioned in subtalar neutral position and 10 degrees of ankle dorsiflexion in a weight bearing stance. After completion of molding, a stable base was applied to the orthotic blank. The blank was ground to a stable position for weight bearing. Size: 13 Base: White EVA Additional Posting and Padding: None The patient ambulated these, and they were very comfortable.  I spent 40 minutes with this patient, greater than 50% was face-to-face time counseling regarding the below diagnosis.  ___________________________________________ Thomas J. Thekkekandam, M.D., ABFM., CAQSM. Primary Care and Sports Medicine [facility]  Adjunct Instructor of Family Medicine  [employer]   

## 2018-09-10 ENCOUNTER — Other Ambulatory Visit: Payer: Self-pay | Admitting: Family Medicine

## 2018-09-10 DIAGNOSIS — S81001D Unspecified open wound, right knee, subsequent encounter: Secondary | ICD-10-CM

## 2018-09-10 DIAGNOSIS — S81801D Unspecified open wound, right lower leg, subsequent encounter: Secondary | ICD-10-CM

## 2018-09-10 DIAGNOSIS — S91001D Unspecified open wound, right ankle, subsequent encounter: Secondary | ICD-10-CM

## 2018-09-10 DIAGNOSIS — I10 Essential (primary) hypertension: Secondary | ICD-10-CM

## 2018-09-10 DIAGNOSIS — R7301 Impaired fasting glucose: Secondary | ICD-10-CM

## 2018-09-15 ENCOUNTER — Telehealth: Payer: Self-pay

## 2018-09-15 DIAGNOSIS — Z98818 Other dental procedure status: Secondary | ICD-10-CM

## 2018-09-15 NOTE — Telephone Encounter (Signed)
In need of Amoxicillin for his dental work tomorrow morning   Last written 03-17-17  RX pended, please send if appropriate and route back so I can contact pt.  Thanks!

## 2018-09-16 MED ORDER — AMOXICILLIN 500 MG PO CAPS: ORAL_CAPSULE | ORAL | 0 refills | Status: DC

## 2018-09-16 NOTE — Telephone Encounter (Signed)
Rx sent 

## 2018-09-17 NOTE — Telephone Encounter (Signed)
Pt advised.

## 2018-09-30 DIAGNOSIS — Z96651 Presence of right artificial knee joint: Secondary | ICD-10-CM | POA: Diagnosis not present

## 2018-09-30 DIAGNOSIS — M1711 Unilateral primary osteoarthritis, right knee: Secondary | ICD-10-CM | POA: Diagnosis not present

## 2018-09-30 DIAGNOSIS — Z96652 Presence of left artificial knee joint: Secondary | ICD-10-CM | POA: Diagnosis not present

## 2018-09-30 DIAGNOSIS — M1712 Unilateral primary osteoarthritis, left knee: Secondary | ICD-10-CM | POA: Diagnosis not present

## 2018-09-30 DIAGNOSIS — Z09 Encounter for follow-up examination after completed treatment for conditions other than malignant neoplasm: Secondary | ICD-10-CM | POA: Diagnosis not present

## 2018-11-06 ENCOUNTER — Other Ambulatory Visit: Payer: Self-pay | Admitting: Family Medicine

## 2018-11-24 DIAGNOSIS — D225 Melanocytic nevi of trunk: Secondary | ICD-10-CM | POA: Diagnosis not present

## 2018-11-24 DIAGNOSIS — L720 Epidermal cyst: Secondary | ICD-10-CM | POA: Diagnosis not present

## 2018-11-24 DIAGNOSIS — D485 Neoplasm of uncertain behavior of skin: Secondary | ICD-10-CM | POA: Diagnosis not present

## 2018-11-26 DIAGNOSIS — L720 Epidermal cyst: Secondary | ICD-10-CM | POA: Diagnosis not present

## 2018-11-27 DIAGNOSIS — L720 Epidermal cyst: Secondary | ICD-10-CM | POA: Diagnosis not present

## 2018-12-03 ENCOUNTER — Ambulatory Visit (INDEPENDENT_AMBULATORY_CARE_PROVIDER_SITE_OTHER): Payer: BLUE CROSS/BLUE SHIELD | Admitting: Family Medicine

## 2018-12-03 ENCOUNTER — Encounter: Payer: Self-pay | Admitting: Family Medicine

## 2018-12-03 VITALS — BP 129/68 | HR 77 | Ht 71.0 in | Wt 289.0 lb

## 2018-12-03 DIAGNOSIS — R7301 Impaired fasting glucose: Secondary | ICD-10-CM | POA: Diagnosis not present

## 2018-12-03 DIAGNOSIS — R6882 Decreased libido: Secondary | ICD-10-CM | POA: Diagnosis not present

## 2018-12-03 DIAGNOSIS — M25511 Pain in right shoulder: Secondary | ICD-10-CM

## 2018-12-03 DIAGNOSIS — Z0000 Encounter for general adult medical examination without abnormal findings: Secondary | ICD-10-CM | POA: Diagnosis not present

## 2018-12-03 MED ORDER — SILDENAFIL CITRATE 20 MG PO TABS: 40.0000 mg | ORAL_TABLET | Freq: Every day | ORAL | 1 refills | Status: DC | PRN

## 2018-12-03 NOTE — Progress Notes (Signed)
CPE - Established Patient Office Visit  Subjective:  Patient ID: Bobby White, male    DOB: 1961-04-21  Age: 58 y.o. MRN: [id number]  CC:  Chief Complaint  Patient presents with  . Annual Exam    HPI Bobby White presents for CPE.  He is not exercising regularly but says he is planning on getting back into the gym.  C/O of some right shoulder pain.  He says is been bothering him for several weeks.  He feels like getting back in the gym would actually help him.  He says it is painful when he reaches past 90 degrees but he is able to do so.  He denies any specific injury or trauma.  He has been taking an NSAID.  No heat or ice.  He does a lot of heavy lifting at work and that seems to aggravate it.  His wife has been on hormone therapy and has lost a lot of weight.  And her libido has increased significantly.  He is finding it challenging to keep up with her and wants to be able to.  He is having hard time recovering after an erection. Also wants to increase his libido.    Past Medical History:  Diagnosis Date  . Arthritis   . Coronary artery disease   . GERD (gastroesophageal reflux disease)   . Hepatitis    Hep C --had to get shots  . Hypertension 1/10  . Impaired fasting glucose 2009  . Obesity   . Sleep apnea   . Varicose vein     Past Surgical History:  Procedure Laterality Date  . CARDIAC CATHETERIZATION     2012  . CORONARY ARTERY BYPASS GRAFT  07/25/11   3 vessel  . pins in right hand  1990's   4th and 5th digits-- removed 6 weeks afterwards  . TOTAL KNEE ARTHROPLASTY Right 09/06/2013   Dr Mayer Camel  . TOTAL KNEE ARTHROPLASTY Right 09/06/2013   Procedure: TOTAL KNEE ARTHROPLASTY;  Surgeon: Kerin Salen, MD;  Location: [facility];  Service: Orthopedics;  Laterality: Right;  . TOTAL KNEE ARTHROPLASTY Left 11/01/2013   DR Mayer Camel  . TOTAL KNEE ARTHROPLASTY Left 11/01/2013   Procedure: TOTAL KNEE ARTHROPLASTY- LEFT;  Surgeon: Kerin Salen, MD;  Location: [facility];   Service: Orthopedics;  Laterality: Left;    Family History  Problem Relation Age of Onset  . Diabetes Mother   . Cancer Father        pancreatic    Social History   Socioeconomic History  . Marital status: Married    Spouse name: Not on file  . Number of children: Not on file  . Years of education: Not on file  . Highest education level: Not on file  Occupational History  . Not on file  Social Needs  . Financial resource strain: Not on file  . Food insecurity:    Worry: Not on file    Inability: Not on file  . Transportation needs:    Medical: Not on file    Non-medical: Not on file  Tobacco Use  . Smoking status: Former Smoker    Packs/day: 1.50    Years: 15.00    Pack years: 22.50    Types: Cigarettes    Last attempt to quit: 11/18/1996    Years since quitting: 22.0  . Smokeless tobacco: Never Used  Substance and Sexual Activity  . Alcohol use: Yes    Comment: socially  . Drug use: Yes  Types: Marijuana    Comment: last time 2012 after his heart surgery  . Sexual activity: Not on file  Lifestyle  . Physical activity:    Days per week: Not on file    Minutes per session: Not on file  . Stress: Not on file  Relationships  . Social connections:    Talks on phone: Not on file    Gets together: Not on file    Attends religious service: Not on file    Active member of club or organization: Not on file    Attends meetings of clubs or organizations: Not on file    Relationship status: Not on file  . Intimate partner violence:    Fear of current or ex partner: Not on file    Emotionally abused: Not on file    Physically abused: Not on file    Forced sexual activity: Not on file  Other Topics Concern  . Not on file  Social History Narrative  . Not on file    Outpatient Medications Prior to Visit  Medication Sig Dispense Refill  . clindamycin (CLINDAGEL) 1 % gel Apply 1 application topically 2 (two) times daily.    Marland Kitchen albuterol (PROVENTIL HFA;VENTOLIN HFA)  108 (90 BASE) MCG/ACT inhaler Inhale 2 puffs into the lungs every 6 (six) hours as needed for wheezing.    Marland Kitchen amLODipine (NORVASC) 5 MG tablet TAKE 1 TABLET DAILY 90 tablet 1  . aspirin EC 325 MG tablet Take 1 tablet (325 mg total) by mouth 2 (two) times daily. 30 tablet 0  . atorvastatin (LIPITOR) 40 MG tablet TAKE 1 TABLET DAILY 90 tablet 3  . b complex vitamins tablet Take 4 tablets by mouth daily.    . Cinnamon 500 MG capsule Take 500 mg by mouth 2 (two) times daily.    Marland Kitchen doxycycline (VIBRA-TABS) 100 MG tablet Take 100 mg by mouth 2 (two) times daily.    . Ginger, Zingiber officinalis, (GINGER ROOT) 250 MG CAPS Take by mouth.    Marland Kitchen GLUCOMANNAN KONJAC ROOT PO Take 1,500 mg by mouth daily.    Marland Kitchen KRILL OIL PO Take 1 capsule by mouth daily.    . meloxicam (MOBIC) 15 MG tablet TAKE 1 TABLET BY MOUTH DAILY 90 tablet 0  . metoprolol succinate (TOPROL-XL) 25 MG 24 hr tablet TAKE 1 TABLET BY MOUTH DAILY GENERIC EQUIVALENT FOR TOPROX- XL 90 tablet 1  . Multiple Vitamins-Minerals (MULTIVITAMIN WITH MINERALS) tablet Take 1 tablet by mouth daily.    . nitroGLYCERIN (NITROSTAT) 0.4 MG SL tablet Place 1 tablet (0.4 mg total) under the tongue every 5 (five) minutes as needed for chest pain. 12 tablet prn  . Omega-3 Fatty Acids (FISH OIL) 1000 MG CAPS Take 1,000 mg by mouth daily.    Marland Kitchen omeprazole (PRILOSEC) 40 MG capsule TAKE 1 CAPSULE DAILY 90 capsule 3  . tamsulosin (FLOMAX) 0.4 MG CAPS capsule Take 2 capsules (0.8 mg total) by mouth daily. 60 capsule 0  . Turmeric 500 MG CAPS Take 1 capsule by mouth daily.    Marland Kitchen amoxicillin (AMOXIL) 500 MG capsule 4 capsules 1 hour prior to dental procedure 4 capsule 0  . cyclobenzaprine (FLEXERIL) 10 MG tablet Take 1 tablet (10 mg total) by mouth 2 (two) times daily as needed for muscle spasms. 20 tablet 0  . ibuprofen (ADVIL,MOTRIN) 800 MG tablet Take 800 mg by mouth every 8 (eight) hours as needed (for pain).    Marland Kitchen ipratropium (ATROVENT) 0.06 % nasal spray Place  2 sprays  into both nostrils every 4 (four) hours as needed for rhinitis. 10 mL 6   No facility-administered medications prior to visit.     Allergies  Allergen Reactions  . Poison Ivy Extract Itching and Rash  . Ace Inhibitors     REACTION: cough  . Bactrim [Sulfamethoxazole-Trimethoprim]     ROS Review of Systems    Objective:    Physical Exam  Constitutional: He is oriented to person, place, and time. He appears well-developed and well-nourished.  HENT:  Head: Normocephalic and atraumatic.  Right Ear: External ear normal.  Left Ear: External ear normal.  Nose: Nose normal.  Mouth/Throat: Oropharynx is clear and moist.  Eyes: Pupils are equal, round, and reactive to light. Conjunctivae and EOM are normal.  Neck: Normal range of motion. Neck supple. No thyromegaly present.  Cardiovascular: Normal rate, regular rhythm, normal heart sounds and intact distal pulses.  Pulmonary/Chest: Effort normal and breath sounds normal.  Abdominal: Soft. Bowel sounds are normal. He exhibits no distension and no mass. There is no abdominal tenderness. There is no rebound and no guarding.  Musculoskeletal: Normal range of motion.     Comments: Right shoulder with normal range of motion in extension but he had pain moving through the 90 degree position.  Tender over the [facility] joint area.  No pain with internal/external rotation.  Strength is 5 out of 5 at the shoulder elbow and wrist.  Positive empty can test on the right.  Some pain with liftoff test.  Pain with crossover test.  Lymphadenopathy:    He has no cervical adenopathy.  Neurological: He is alert and oriented to person, place, and time. He has normal reflexes.  Skin: Skin is warm and dry.  Psychiatric: He has a normal mood and affect. His behavior is normal. Judgment and thought content normal.    BP 129/68   Pulse 77   Ht 5\' 11"  (1.803 m)   Wt 289 lb (131.1 kg)   SpO2 94%   BMI 40.31 kg/m  Wt Readings from Last 3 Encounters:  12/03/18 289  lb (131.1 kg)  08/31/18 286 lb (129.7 kg)  08/17/18 288 lb (130.6 kg)     Health Maintenance Due  Topic Date Due  . COLONOSCOPY  11/18/2014    There are no preventive care reminders to display for this patient.  Lab Results  Component Value Date   TSH 1.665 04/24/2012   Lab Results  Component Value Date   WBC 6.3 04/11/2015   HGB 14.6 04/11/2015   HCT 42.9 04/11/2015   MCV 83.3 04/11/2015   PLT 216 04/11/2015   Lab Results  Component Value Date   NA 141 06/03/2018   K 4.6 06/03/2018   CO2 28 06/03/2018   GLUCOSE 98 06/03/2018   BUN 26 (H) 06/03/2018   CREATININE 0.89 06/03/2018   BILITOT 0.8 06/03/2018   ALKPHOS 95 05/19/2017   AST 21 06/03/2018   ALT 22 06/03/2018   PROT 6.2 06/03/2018   ALBUMIN 4.5 05/19/2017   CALCIUM 9.1 06/03/2018   Lab Results  Component Value Date   CHOL 106 06/03/2018   Lab Results  Component Value Date   HDL 37 (L) 06/03/2018   Lab Results  Component Value Date   LDLCALC 52 06/03/2018   Lab Results  Component Value Date   TRIG 83 06/03/2018   Lab Results  Component Value Date   CHOLHDL 2.9 06/03/2018   Lab Results  Component Value Date   HGBA1C  5.4 06/02/2018      Assessment & Plan:   Problem List Items Addressed This Visit      Endocrine   IMPAIRED FASTING GLUCOSE   Relevant Orders   BASIC METABOLIC PANEL WITH GFR   Testosterone Total,Free,Bio, Males    Other Visit Diagnoses    Routine general medical examination at a health care facility    -  Primary   Relevant Orders   BASIC METABOLIC PANEL WITH GFR   Testosterone Total,Free,Bio, Males   Acute pain of right shoulder       Decreased libido without sexual dysfunction         CPE - Keep up a regular exercise program and make sure you are eating a healthy diet Try to eat 4 servings of dairy a day, or if you are lactose intolerant take a calcium with vitamin D daily.  Your vaccines are up to date. PSA is up to date.  Colonoscopy 2016 done in [facility]   Will get copy put into our system.  Health Maintenance  Topic Date Due  . COLONOSCOPY  11/18/2014  . TETANUS/TDAP  06/02/2028  . INFLUENZA VACCINE  Completed  . Hepatitis C Screening  Completed  . HIV Screening  Completed      Acute right shoulder pain-suspect rotator cuff injury probably supraspinatus is impingement.  Recommend physical therapy.  He is willing to try some home exercises and wants to try getting back into the gym.  Warned him about doing any overhead weight lifting.Marland Kitchen  He had a positive empty can test.  If not improving then we can refer him to formal PT and consider imaging if not improving.  To use anti-inflammatory and recommend a trial of heat or ice.  Low libido-we discussed options.  We can certainly check check testosterone levels.  Also encouraged him to get back into the gym and start working out and trying to lose some weight.  If he is able to decrease the fat on the body this also decreases the estrogen levels which help the testosterone levels.  In addition we discussed a trial of ADD medication.  Meds ordered this encounter  Medications  . sildenafil (REVATIO) 20 MG tablet    Sig: Take 2-5 tablets (40-100 mg total) by mouth daily as needed.    Dispense:  60 tablet    Refill:  1    Follow-up: Return in about 6 months (around 06/03/2019) for BP and fasting labs.  Beatrice Lecher, MD

## 2018-12-03 NOTE — Patient Instructions (Signed)
Health Maintenance, Male A healthy lifestyle and preventive care is important for your health and wellness. Ask your health care provider about what schedule of regular examinations is right for you. What should I know about weight and diet? Eat a Healthy Diet  Eat plenty of vegetables, fruits, whole grains, low-fat dairy products, and lean protein.  Do not eat a lot of foods high in solid fats, added sugars, or salt.  Maintain a Healthy Weight Regular exercise can help you achieve or maintain a healthy weight. You should:  Do at least 150 minutes of exercise each week. The exercise should increase your heart rate and make you sweat (moderate-intensity exercise).  Do strength-training exercises at least twice a week. Watch Your Levels of Cholesterol and Blood Lipids  Have your blood tested for lipids and cholesterol every 5 years starting at 60 years of age. If you are at high risk for heart disease, you should start having your blood tested when you are 60 years old. You may need to have your cholesterol levels checked more often if: ? Your lipid or cholesterol levels are high. ? You are older than 60 years of age. ? You are at high risk for heart disease. What should I know about cancer screening? Many types of cancers can be detected early and may often be prevented. Lung Cancer  You should be screened every year for lung cancer if: ? You are a current smoker who has smoked for at least 30 years. ? You are a former smoker who has quit within the past 15 years.  Talk to your health care provider about your screening options, when you should start screening, and how often you should be screened. Colorectal Cancer  Routine colorectal cancer screening usually begins at 60 years of age and should be repeated every 5-10 years until you are 60 years old. You may need to be screened more often if early forms of precancerous polyps or small growths are found. Your health care provider may  recommend screening at an earlier age if you have risk factors for colon cancer.  Your health care provider may recommend using home test kits to check for hidden blood in the stool.  A small camera at the end of a tube can be used to examine your colon (sigmoidoscopy or colonoscopy). This checks for the earliest forms of colorectal cancer. Prostate and Testicular Cancer  Depending on your age and overall health, your health care provider may do certain tests to screen for prostate and testicular cancer.  Talk to your health care provider about any symptoms or concerns you have about testicular or prostate cancer. Skin Cancer  Check your skin from head to toe regularly.  Tell your health care provider about any new moles or changes in moles, especially if: ? There is a change in a mole's size, shape, or color. ? You have a mole that is larger than a pencil eraser.  Always use sunscreen. Apply sunscreen liberally and repeat throughout the day.  Protect yourself by wearing long sleeves, pants, a wide-brimmed hat, and sunglasses when outside. What should I know about heart disease, diabetes, and high blood pressure?  If you are 18-39 years of age, have your blood pressure checked every 3-5 years. If you are 40 years of age or older, have your blood pressure checked every year. You should have your blood pressure measured twice-once when you are at a hospital or clinic, and once when you are not at a hospital   or clinic. Record the average of the two measurements. To check your blood pressure when you are not at a hospital or clinic, you can use: ? An automated blood pressure machine at a pharmacy. ? A home blood pressure monitor.  Talk to your health care provider about your target blood pressure.  If you are between 51-38 years old, ask your health care provider if you should take aspirin to prevent heart disease.  Have regular diabetes screenings by checking your fasting blood sugar  level. ? If you are at a normal weight and have a low risk for diabetes, have this test once every three years after the age of 60. ? If you are overweight and have a high risk for diabetes, consider being tested at a younger age or more often.  A one-time screening for abdominal aortic aneurysm (AAA) by ultrasound is recommended for men aged 47-75 years who are current or former smokers. What should I know about preventing infection? Hepatitis B If you have a higher risk for hepatitis B, you should be screened for this virus. Talk with your health care provider to find out if you are at risk for hepatitis B infection. Hepatitis C Blood testing is recommended for:  Everyone born from [birth year] through 1965.  Anyone with known risk factors for hepatitis C. Sexually Transmitted Diseases (STDs)  You should be screened each year for STDs including gonorrhea and chlamydia if: ? You are sexually active and are younger than 61 years of age. ? You are older than 60 years of age and your health care provider tells you that you are at risk for this type of infection. ? Your sexual activity has changed since you were last screened and you are at an increased risk for chlamydia or gonorrhea. Ask your health care provider if you are at risk.  Talk with your health care provider about whether you are at high risk of being infected with HIV. Your health care provider may recommend a prescription medicine to help prevent HIV infection. What else can I do?  Schedule regular health, dental, and eye exams.  Stay current with your vaccines (immunizations).  Do not use any tobacco products, such as cigarettes, chewing tobacco, and e-cigarettes. If you need help quitting, ask your health care provider.  Limit alcohol intake to no more than 2 drinks per day. One drink equals 12 ounces of beer, 5 ounces of wine, or 1 ounces of hard liquor.  Do not use street drugs.  Do not share needles.  Ask your health  care provider for help if you need support or information about quitting drugs.  Tell your health care provider if you often feel depressed.  Tell your health care provider if you have ever been abused or do not feel safe at home. This information is not intended to replace advice given to you by your health care provider. Make sure you discuss any questions you have with your health care provider. Document Released: 05/02/2008 Document Revised: 07/03/2016 Document Reviewed: 08/08/2015 Elsevier Interactive Patient Education  2019 [employer] Maintenance, Male A healthy lifestyle and preventive care is important for your health and wellness. Ask your health care provider about what schedule of regular examinations is right for you. What should I know about weight and diet? Eat a Healthy Diet  Eat plenty of vegetables, fruits, whole grains, low-fat dairy products, and lean protein.  Do not eat a lot of foods high in solid fats, added sugars, or  salt.  Maintain a Healthy Weight Regular exercise can help you achieve or maintain a healthy weight. You should:  Do at least 150 minutes of exercise each week. The exercise should increase your heart rate and make you sweat (moderate-intensity exercise).  Do strength-training exercises at least twice a week. Watch Your Levels of Cholesterol and Blood Lipids  Have your blood tested for lipids and cholesterol every 5 years starting at 60 years of age. If you are at high risk for heart disease, you should start having your blood tested when you are 60 years old. You may need to have your cholesterol levels checked more often if: ? Your lipid or cholesterol levels are high. ? You are older than 60 years of age. ? You are at high risk for heart disease. What should I know about cancer screening? Many types of cancers can be detected early and may often be prevented. Lung Cancer  You should be screened every year for lung cancer  if: ? You are a current smoker who has smoked for at least 30 years. ? You are a former smoker who has quit within the past 15 years.  Talk to your health care provider about your screening options, when you should start screening, and how often you should be screened. Colorectal Cancer  Routine colorectal cancer screening usually begins at 60 years of age and should be repeated every 5-10 years until you are 60 years old. You may need to be screened more often if early forms of precancerous polyps or small growths are found. Your health care provider may recommend screening at an earlier age if you have risk factors for colon cancer.  Your health care provider may recommend using home test kits to check for hidden blood in the stool.  A small camera at the end of a tube can be used to examine your colon (sigmoidoscopy or colonoscopy). This checks for the earliest forms of colorectal cancer. Prostate and Testicular Cancer  Depending on your age and overall health, your health care provider may do certain tests to screen for prostate and testicular cancer.  Talk to your health care provider about any symptoms or concerns you have about testicular or prostate cancer. Skin Cancer  Check your skin from head to toe regularly.  Tell your health care provider about any new moles or changes in moles, especially if: ? There is a change in a mole's size, shape, or color. ? You have a mole that is larger than a pencil eraser.  Always use sunscreen. Apply sunscreen liberally and repeat throughout the day.  Protect yourself by wearing long sleeves, pants, a wide-brimmed hat, and sunglasses when outside. What should I know about heart disease, diabetes, and high blood pressure?  If you are 40-80 years of age, have your blood pressure checked every 3-5 years. If you are 48 years of age or older, have your blood pressure checked every year. You should have your blood pressure measured twice-once when  you are at a hospital or clinic, and once when you are not at a hospital or clinic. Record the average of the two measurements. To check your blood pressure when you are not at a hospital or clinic, you can use: ? An automated blood pressure machine at a pharmacy. ? A home blood pressure monitor.  Talk to your health care provider about your target blood pressure.  If you are between 43-55 years old, ask your health care provider if you should take aspirin  to prevent heart disease.  Have regular diabetes screenings by checking your fasting blood sugar level. ? If you are at a normal weight and have a low risk for diabetes, have this test once every three years after the age of 51. ? If you are overweight and have a high risk for diabetes, consider being tested at a younger age or more often.  A one-time screening for abdominal aortic aneurysm (AAA) by ultrasound is recommended for men aged 66-75 years who are current or former smokers. What should I know about preventing infection? Hepatitis B If you have a higher risk for hepatitis B, you should be screened for this virus. Talk with your health care provider to find out if you are at risk for hepatitis B infection. Hepatitis C Blood testing is recommended for:  Everyone born from [birth year] through 1967.  Anyone with known risk factors for hepatitis C. Sexually Transmitted Diseases (STDs)  You should be screened each year for STDs including gonorrhea and chlamydia if: ? You are sexually active and are younger than 60 years of age. ? You are older than 60 years of age and your health care provider tells you that you are at risk for this type of infection. ? Your sexual activity has changed since you were last screened and you are at an increased risk for chlamydia or gonorrhea. Ask your health care provider if you are at risk.  Talk with your health care provider about whether you are at high risk of being infected with HIV. Your health care  provider may recommend a prescription medicine to help prevent HIV infection. What else can I do?  Schedule regular health, dental, and eye exams.  Stay current with your vaccines (immunizations).  Do not use any tobacco products, such as cigarettes, chewing tobacco, and e-cigarettes. If you need help quitting, ask your health care provider.  Limit alcohol intake to no more than 2 drinks per day. One drink equals 12 ounces of beer, 5 ounces of wine, or 1 ounces of hard liquor.  Do not use street drugs.  Do not share needles.  Ask your health care provider for help if you need support or information about quitting drugs.  Tell your health care provider if you often feel depressed.  Tell your health care provider if you have ever been abused or do not feel safe at home. This information is not intended to replace advice given to you by your health care provider. Make sure you discuss any questions you have with your health care provider. Document Released: 05/02/2008 Document Revised: 07/03/2016 Document Reviewed: 08/08/2015 Elsevier Interactive Patient Education  2019 [employer].

## 2018-12-04 ENCOUNTER — Encounter: Payer: Self-pay | Admitting: Family Medicine

## 2018-12-04 LAB — TESTOSTERONE TOTAL,FREE,BIO, MALES
Albumin: 4.2 g/dL (ref 3.6–5.1)
SEX HORMONE BINDING: 40 nmol/L (ref 22–77)
TESTOSTERONE: 275 ng/dL (ref 250–827)
Testosterone, Bioavailable: 57.7 ng/dL — ABNORMAL LOW (ref >=110.0)
Testosterone, Free: 30 pg/mL — ABNORMAL LOW (ref 46.0–224.0)

## 2018-12-04 LAB — BASIC METABOLIC PANEL WITH GFR
BUN/Creatinine Ratio: 32 (calc) — ABNORMAL HIGH (ref 6–22)
BUN: 21 mg/dL (ref 7–25)
CALCIUM: 9.3 mg/dL (ref 8.6–10.3)
CO2: 27 mmol/L (ref 20–32)
Chloride: 106 mmol/L (ref 98–110)
Creat: 0.66 mg/dL — ABNORMAL LOW (ref 0.70–1.33)
GFR, Est African American: 123 mL/min/{1.73_m2} (ref >=60)
GFR, Est Non African American: 106 mL/min/{1.73_m2} (ref >=60)
Glucose, Bld: 101 mg/dL — ABNORMAL HIGH (ref 65–99)
Potassium: 4.2 mmol/L (ref 3.5–5.3)
Sodium: 140 mmol/L (ref 135–146)

## 2018-12-22 DIAGNOSIS — E291 Testicular hypofunction: Secondary | ICD-10-CM | POA: Diagnosis not present

## 2018-12-29 DIAGNOSIS — N529 Male erectile dysfunction, unspecified: Secondary | ICD-10-CM | POA: Diagnosis not present

## 2018-12-29 DIAGNOSIS — R5383 Other fatigue: Secondary | ICD-10-CM | POA: Diagnosis not present

## 2018-12-29 DIAGNOSIS — E291 Testicular hypofunction: Secondary | ICD-10-CM | POA: Diagnosis not present

## 2018-12-29 DIAGNOSIS — R6882 Decreased libido: Secondary | ICD-10-CM | POA: Diagnosis not present

## 2019-01-25 ENCOUNTER — Other Ambulatory Visit: Payer: Self-pay | Admitting: Family Medicine

## 2019-01-26 DIAGNOSIS — E291 Testicular hypofunction: Secondary | ICD-10-CM | POA: Diagnosis not present

## 2019-01-27 ENCOUNTER — Other Ambulatory Visit: Payer: Self-pay | Admitting: *Deleted

## 2019-01-27 DIAGNOSIS — S91001D Unspecified open wound, right ankle, subsequent encounter: Secondary | ICD-10-CM

## 2019-01-27 DIAGNOSIS — I10 Essential (primary) hypertension: Secondary | ICD-10-CM

## 2019-01-27 DIAGNOSIS — S81001D Unspecified open wound, right knee, subsequent encounter: Secondary | ICD-10-CM

## 2019-01-27 DIAGNOSIS — R7301 Impaired fasting glucose: Secondary | ICD-10-CM

## 2019-01-27 DIAGNOSIS — S81801D Unspecified open wound, right lower leg, subsequent encounter: Secondary | ICD-10-CM

## 2019-01-27 MED ORDER — METOPROLOL SUCCINATE ER 25 MG PO TB24: ORAL_TABLET | ORAL | 1 refills | Status: DC

## 2019-01-27 MED ORDER — TAMSULOSIN HCL 0.4 MG PO CAPS: 0.8000 mg | ORAL_CAPSULE | Freq: Every day | ORAL | 3 refills | Status: DC

## 2019-01-27 MED ORDER — MELOXICAM 15 MG PO TABS: 15.0000 mg | ORAL_TABLET | Freq: Every day | ORAL | 1 refills | Status: DC

## 2019-02-02 DIAGNOSIS — R5383 Other fatigue: Secondary | ICD-10-CM | POA: Diagnosis not present

## 2019-02-02 DIAGNOSIS — Z6839 Body mass index (BMI) 39.0-39.9, adult: Secondary | ICD-10-CM | POA: Diagnosis not present

## 2019-02-02 DIAGNOSIS — N529 Male erectile dysfunction, unspecified: Secondary | ICD-10-CM | POA: Diagnosis not present

## 2019-02-02 DIAGNOSIS — R6882 Decreased libido: Secondary | ICD-10-CM | POA: Diagnosis not present

## 2019-02-04 ENCOUNTER — Ambulatory Visit (INDEPENDENT_AMBULATORY_CARE_PROVIDER_SITE_OTHER): Payer: BLUE CROSS/BLUE SHIELD | Admitting: Sports Medicine

## 2019-02-04 ENCOUNTER — Other Ambulatory Visit: Payer: Self-pay

## 2019-02-04 ENCOUNTER — Encounter: Payer: Self-pay | Admitting: Sports Medicine

## 2019-02-04 DIAGNOSIS — M180 Bilateral primary osteoarthritis of first carpometacarpal joints: Secondary | ICD-10-CM

## 2019-02-04 DIAGNOSIS — Z9889 Other specified postprocedural states: Secondary | ICD-10-CM | POA: Insufficient documentation

## 2019-02-04 DIAGNOSIS — M75101 Unspecified rotator cuff tear or rupture of right shoulder, not specified as traumatic: Secondary | ICD-10-CM

## 2019-02-04 MED ORDER — DICLOFENAC SODIUM 75 MG PO TBEC: 75.0000 mg | DELAYED_RELEASE_TABLET | Freq: Two times a day (BID) | ORAL | 3 refills | Status: DC

## 2019-02-04 NOTE — Addendum Note (Signed)
Addended by: Silverio Decamp on: 02/04/2019 08:51 AM   Modules accepted: Orders

## 2019-02-04 NOTE — Assessment & Plan Note (Signed)
Rehab exercises given. Return in a month, injections if no better.

## 2019-02-04 NOTE — Progress Notes (Addendum)
Subjective:    CC: Bilateral hand pain  HPI: This is a pleasant 58 year old male, for the past few weeks has had worsening pain in the base of both thumbs, moderate, worsening, localized without radiation.  His last set of injections were approximately 6 months ago.  In addition he is noted pain for the last few months in his right shoulder, localized over the deltoid, waking him from sleep and worse with overhead activities.  No trauma.  I reviewed the past medical history, family history, social history, surgical history, and allergies today and no changes were needed.  Please see the problem list section below in epic for further details.  Past Medical History: Past Medical History:  Diagnosis Date  . Arthritis   . Coronary artery disease   . GERD (gastroesophageal reflux disease)   . Hepatitis    Hep C --had to get shots  . Hypertension 1/10  . Impaired fasting glucose 2009  . Obesity   . Sleep apnea   . Varicose vein    Past Surgical History: Past Surgical History:  Procedure Laterality Date  . CARDIAC CATHETERIZATION     2012  . CORONARY ARTERY BYPASS GRAFT  07/25/11   3 vessel  . pins in right hand  1990's   4th and 5th digits-- removed 6 weeks afterwards  . TOTAL KNEE ARTHROPLASTY Right 09/06/2013   Dr Mayer Camel  . TOTAL KNEE ARTHROPLASTY Right 09/06/2013   Procedure: TOTAL KNEE ARTHROPLASTY;  Surgeon: Kerin Salen, MD;  Location: [place];  Service: Orthopedics;  Laterality: Right;  . TOTAL KNEE ARTHROPLASTY Left 11/01/2013   DR Mayer Camel  . TOTAL KNEE ARTHROPLASTY Left 11/01/2013   Procedure: TOTAL KNEE ARTHROPLASTY- LEFT;  Surgeon: Kerin Salen, MD;  Location: [place];  Service: Orthopedics;  Laterality: Left;   Social History: Social History   Socioeconomic History  . Marital status: Married    Spouse name: Not on file  . Number of children: Not on file  . Years of education: Not on file  . Highest education level: Not on file  Occupational History  . Not on file   Social Needs  . Financial resource strain: Not on file  . Food insecurity:    Worry: Not on file    Inability: Not on file  . Transportation needs:    Medical: Not on file    Non-medical: Not on file  Tobacco Use  . Smoking status: Former Smoker    Packs/day: 1.50    Years: 15.00    Pack years: 22.50    Types: Cigarettes    Last attempt to quit: 11/18/1996    Years since quitting: 22.2  . Smokeless tobacco: Never Used  Substance and Sexual Activity  . Alcohol use: Yes    Comment: socially  . Drug use: Yes    Types: Marijuana    Comment: last time 2012 after his heart surgery  . Sexual activity: Not on file  Lifestyle  . Physical activity:    Days per week: Not on file    Minutes per session: Not on file  . Stress: Not on file  Relationships  . Social connections:    Talks on phone: Not on file    Gets together: Not on file    Attends religious service: Not on file    Active member of club or organization: Not on file    Attends meetings of clubs or organizations: Not on file    Relationship status: Not on file  Other Topics Concern  . Not on file  Social History Narrative  . Not on file   Family History: Family History  Problem Relation Age of Onset  . Diabetes Mother   . Cancer Father        pancreatic   Allergies: Allergies  Allergen Reactions  . Poison Ivy Extract Itching and Rash  . Ace Inhibitors     REACTION: cough  . Bactrim [Sulfamethoxazole-Trimethoprim]    Medications: See med rec.  Review of Systems: No fevers, chills, night sweats, weight loss, chest pain, or shortness of breath.   Objective:    General: Well Developed, well nourished, and in no acute distress.  Neuro: Alert and oriented x3, extra-ocular muscles intact, sensation grossly intact.  HEENT: Normocephalic, atraumatic, pupils equal round reactive to light, neck supple, no masses, no lymphadenopathy, thyroid nonpalpable.  Skin: Warm and dry, no rashes. Cardiac: Regular rate and  rhythm, no murmurs rubs or gallops, no lower extremity edema.  Respiratory: Clear to auscultation bilaterally. Not using accessory muscles, speaking in full sentences. Right shoulder: Inspection reveals no abnormalities, atrophy or asymmetry. Palpation is normal with no tenderness over AC joint or bicipital groove. ROM is full in all planes. Rotator cuff strength normal throughout. Positive Neer and Hawkin's tests, empty can. Speeds and Yergason's tests normal. No labral pathology noted with negative Obrien's, negative crank, negative clunk, and good stability. Normal scapular function observed. No painful arc and no drop arm sign. No apprehension sign  Procedure: Real-time Ultrasound Guided injection of the left first [facility] Device: GE Logiq E  Verbal informed consent obtained.  Time-out conducted.  Noted no overlying erythema, induration, or other signs of local infection.  Skin prepped in a sterile fashion.  Local anesthesia: Topical Ethyl chloride.  With sterile technique and under real time ultrasound guidance:  1/2 cc Kenalog 40, 1/2 cc lidocaine injected easily Completed without difficulty  Pain immediately resolved suggesting accurate placement of the medication.  Advised to call if fevers/chills, erythema, induration, drainage, or persistent bleeding.  Images permanently stored and available for review in the ultrasound unit.  Impression: Technically successful ultrasound guided injection.  Procedure: Real-time Ultrasound Guided injection of the right first [facility] Device: GE Logiq E  Verbal informed consent obtained.  Time-out conducted.  Noted no overlying erythema, induration, or other signs of local infection.  Skin prepped in a sterile fashion.  Local anesthesia: Topical Ethyl chloride.  With sterile technique and under real time ultrasound guidance:  1/2 cc Kenalog 40, 1/2 cc lidocaine injected easily Completed without difficulty  Pain immediately resolved suggesting  accurate placement of the medication.  Advised to call if fevers/chills, erythema, induration, drainage, or persistent bleeding.  Images permanently stored and available for review in the ultrasound unit.  Impression: Technically successful ultrasound guided injection.  Impression and Recommendations:    Primary osteoarthritis of both first carpometacarpal joints 65-month response to previous injection, repeat bilateral first [place] injection today. He has been on Mobic for a while. Switching to Voltaren.  Rotator cuff syndrome, right Rehab exercises given. Return in a month, injections if no better.   ___________________________________________ Gwen Her. Dianah Field, M.D., ABFM., CAQSM. Primary Care and Sports Medicine [facility]  Adjunct Professor of [employer] [facility] [employer]

## 2019-02-04 NOTE — Assessment & Plan Note (Addendum)
73-month response to previous injection, repeat bilateral first [place] injection today. He has been on Mobic for a while. Switching to Voltaren.

## 2019-03-18 ENCOUNTER — Ambulatory Visit: Payer: BLUE CROSS/BLUE SHIELD | Admitting: Sports Medicine

## 2019-03-30 DIAGNOSIS — G4733 Obstructive sleep apnea (adult) (pediatric): Secondary | ICD-10-CM | POA: Diagnosis not present

## 2019-05-05 DIAGNOSIS — E291 Testicular hypofunction: Secondary | ICD-10-CM | POA: Diagnosis not present

## 2019-05-05 DIAGNOSIS — R6882 Decreased libido: Secondary | ICD-10-CM | POA: Diagnosis not present

## 2019-05-05 DIAGNOSIS — R5383 Other fatigue: Secondary | ICD-10-CM | POA: Diagnosis not present

## 2019-05-05 LAB — CBC AND DIFFERENTIAL
Hemoglobin: 14.8 (ref 13.5–17.5)
Platelets: 220 (ref 150–399)
WBC: 6.5

## 2019-05-05 LAB — TSH: TSH: 1.5 (ref 0.41–5.90)

## 2019-05-05 LAB — PSA: PSA: 1.1

## 2019-05-11 DIAGNOSIS — R5383 Other fatigue: Secondary | ICD-10-CM | POA: Diagnosis not present

## 2019-05-11 DIAGNOSIS — E6609 Other obesity due to excess calories: Secondary | ICD-10-CM | POA: Diagnosis not present

## 2019-05-11 DIAGNOSIS — E291 Testicular hypofunction: Secondary | ICD-10-CM | POA: Diagnosis not present

## 2019-05-11 DIAGNOSIS — Z6839 Body mass index (BMI) 39.0-39.9, adult: Secondary | ICD-10-CM | POA: Diagnosis not present

## 2019-05-11 DIAGNOSIS — R6882 Decreased libido: Secondary | ICD-10-CM | POA: Diagnosis not present

## 2019-05-11 DIAGNOSIS — G4733 Obstructive sleep apnea (adult) (pediatric): Secondary | ICD-10-CM | POA: Diagnosis not present

## 2019-06-08 DIAGNOSIS — R6882 Decreased libido: Secondary | ICD-10-CM | POA: Diagnosis not present

## 2019-06-08 DIAGNOSIS — E291 Testicular hypofunction: Secondary | ICD-10-CM | POA: Diagnosis not present

## 2019-06-08 DIAGNOSIS — R5383 Other fatigue: Secondary | ICD-10-CM | POA: Diagnosis not present

## 2019-06-10 ENCOUNTER — Encounter: Payer: Self-pay | Admitting: Family Medicine

## 2019-06-10 ENCOUNTER — Other Ambulatory Visit: Payer: Self-pay

## 2019-06-10 ENCOUNTER — Telehealth: Payer: Self-pay | Admitting: Family Medicine

## 2019-06-10 ENCOUNTER — Ambulatory Visit (INDEPENDENT_AMBULATORY_CARE_PROVIDER_SITE_OTHER): Payer: BC Managed Care – PPO | Admitting: Family Medicine

## 2019-06-10 VITALS — BP 121/67 | HR 71 | Ht 71.0 in | Wt 274.0 lb

## 2019-06-10 DIAGNOSIS — I10 Essential (primary) hypertension: Secondary | ICD-10-CM

## 2019-06-10 DIAGNOSIS — Z125 Encounter for screening for malignant neoplasm of prostate: Secondary | ICD-10-CM

## 2019-06-10 DIAGNOSIS — R7301 Impaired fasting glucose: Secondary | ICD-10-CM | POA: Diagnosis not present

## 2019-06-10 DIAGNOSIS — Z951 Presence of aortocoronary bypass graft: Secondary | ICD-10-CM | POA: Diagnosis not present

## 2019-06-10 DIAGNOSIS — H5789 Other specified disorders of eye and adnexa: Secondary | ICD-10-CM

## 2019-06-10 DIAGNOSIS — E785 Hyperlipidemia, unspecified: Secondary | ICD-10-CM

## 2019-06-10 MED ORDER — ERYTHROMYCIN 5 MG/GM OP OINT: 1.0000 "application " | TOPICAL_OINTMENT | Freq: Three times a day (TID) | OPHTHALMIC | 0 refills | Status: DC

## 2019-06-10 NOTE — Assessment & Plan Note (Addendum)
Will check A1C . Stable, looks great.  12 mo f/u Lab Results  Component Value Date   HGBA1C 5.4 06/02/2018

## 2019-06-10 NOTE — Telephone Encounter (Signed)
Please call patient and let him know that I apologize I meant to look in his eye.  I would like to put him on some drops since he is noticing some crusting in the morning.  If is not improving over the next couple days then please give me a call back.  Again I apologize.  I know he had mentioned it during the visit and I forgot to take a look.

## 2019-06-10 NOTE — Assessment & Plan Note (Signed)
Keep appointment in August with Dr. Prince Rome..  Continue statin beta-blocker and aspirin.  Has been asymptomatic.

## 2019-06-10 NOTE — Assessment & Plan Note (Signed)
Well controlled. Continue current regimen. Follow up in  6 months.  

## 2019-06-10 NOTE — Progress Notes (Addendum)
Established Patient Office Visit  Subjective:  Patient ID: Bobby White, male    DOB: 12-23-1960  Age: 58 y.o. MRN: [id number]  CC:  Chief Complaint  Patient presents with  . Hypertension    HPI Bobby White presents for   Hypertension- Pt denies chest pain, SOB, dizziness, or heart palpitations.  Taking meds as directed w/o problems.  Denies medication side effects.     F/U CAD s/p bypass -follows yearly with Dr. Prince Rome.  Currently on a statin and a beta-blocker as well as aspirin.  Hyperlipidemia-tolerating statin well without any side effects or problems.  Impaired fasting glucose-no increased thirst or urination. No symptoms consistent with hypoglycemia.  He is going to [place] and he is on a synthetic testosterone. They have been checking his PSA.  He did bring in some lab work that they have done.  He has been on it for a little over 6 months at this point.  They are putting the pellets underneath his skin.  He does feel like it has helped with his weight loss attempts.  He also wanted to let me know that he was washing his dog and thinks he got tea tree oil in his left eye.  It has been red and irritated and crusting.  But he is also not sure if it may be dirt or etc.  Past Medical History:  Diagnosis Date  . Arthritis   . Coronary artery disease   . GERD (gastroesophageal reflux disease)   . Hepatitis    Hep C --had to get shots  . Hypertension 1/10  . Impaired fasting glucose 2009  . Obesity   . Sleep apnea   . Varicose vein     Past Surgical History:  Procedure Laterality Date  . CARDIAC CATHETERIZATION     2012  . CORONARY ARTERY BYPASS GRAFT  07/25/11   3 vessel  . pins in right hand  1990's   4th and 5th digits-- removed 6 weeks afterwards  . TOTAL KNEE ARTHROPLASTY Right 09/06/2013   Dr Mayer Camel  . TOTAL KNEE ARTHROPLASTY Right 09/06/2013   Procedure: TOTAL KNEE ARTHROPLASTY;  Surgeon: Kerin Salen, MD;  Location: [place];  Service: Orthopedics;   Laterality: Right;  . TOTAL KNEE ARTHROPLASTY Left 11/01/2013   DR Mayer Camel  . TOTAL KNEE ARTHROPLASTY Left 11/01/2013   Procedure: TOTAL KNEE ARTHROPLASTY- LEFT;  Surgeon: Kerin Salen, MD;  Location: [place];  Service: Orthopedics;  Laterality: Left;    Family History  Problem Relation Age of Onset  . Diabetes Mother   . Cancer Father        pancreatic    Social History   Socioeconomic History  . Marital status: Married    Spouse name: Not on file  . Number of children: Not on file  . Years of education: Not on file  . Highest education level: Not on file  Occupational History  . Not on file  Social Needs  . Financial resource strain: Not on file  . Food insecurity    Worry: Not on file    Inability: Not on file  . Transportation needs    Medical: Not on file    Non-medical: Not on file  Tobacco Use  . Smoking status: Former Smoker    Packs/day: 1.50    Years: 15.00    Pack years: 22.50    Types: Cigarettes    Quit date: 11/18/1996    Years since quitting: 22.5  . Smokeless  tobacco: Never Used  Substance and Sexual Activity  . Alcohol use: Yes    Comment: socially  . Drug use: Yes    Types: Marijuana    Comment: last time 2012 after his heart surgery  . Sexual activity: Not on file  Lifestyle  . Physical activity    Days per week: Not on file    Minutes per session: Not on file  . Stress: Not on file  Relationships  . Social Herbalist on phone: Not on file    Gets together: Not on file    Attends religious service: Not on file    Active member of club or organization: Not on file    Attends meetings of clubs or organizations: Not on file    Relationship status: Not on file  . Intimate partner violence    Fear of current or ex partner: Not on file    Emotionally abused: Not on file    Physically abused: Not on file    Forced sexual activity: Not on file  Other Topics Concern  . Not on file  Social History Narrative  . Not on file     Outpatient Medications Prior to Visit  Medication Sig Dispense Refill  . albuterol (PROVENTIL HFA;VENTOLIN HFA) 108 (90 BASE) MCG/ACT inhaler Inhale 2 puffs into the lungs every 6 (six) hours as needed for wheezing.    Marland Kitchen amLODipine (NORVASC) 5 MG tablet TAKE 1 TABLET DAILY 90 tablet 1  . anastrozole (ARIMIDEX) 1 MG tablet Take 1 tablet by mouth every 14 (fourteen) days.    Marland Kitchen aspirin EC 325 MG tablet Take 1 tablet (325 mg total) by mouth 2 (two) times daily. 30 tablet 0  . atorvastatin (LIPITOR) 40 MG tablet TAKE 1 TABLET BY MOUTH DAILY 90 tablet 3  . b complex vitamins tablet Take 4 tablets by mouth daily.    . Cinnamon 500 MG capsule Take 500 mg by mouth 2 (two) times daily.    . diclofenac (VOLTAREN) 75 MG EC tablet Take 1 tablet (75 mg total) by mouth 2 (two) times daily. 60 tablet 3  . Ginger, Zingiber officinalis, (GINGER ROOT) 250 MG CAPS Take by mouth.    Marland Kitchen GLUCOMANNAN KONJAC ROOT PO Take 1,500 mg by mouth daily.    Marland Kitchen KRILL OIL PO Take 1 capsule by mouth daily.    . metoprolol succinate (TOPROL-XL) 25 MG 24 hr tablet TAKE 1 TABLET BY MOUTH DAILY GENERIC EQUIVALENT FOR TOPROX- XL 90 tablet 1  . Multiple Vitamins-Minerals (MULTIVITAMIN WITH MINERALS) tablet Take 1 tablet by mouth daily.    . Omega-3 Fatty Acids (FISH OIL) 1000 MG CAPS Take 1,000 mg by mouth daily.    Marland Kitchen omeprazole (PRILOSEC) 40 MG capsule TAKE 1 CAPSULE DAILY 90 capsule 3  . sildenafil (REVATIO) 20 MG tablet Take 2-5 tablets (40-100 mg total) by mouth daily as needed. 60 tablet 1  . tamsulosin (FLOMAX) 0.4 MG CAPS capsule Take 2 capsules (0.8 mg total) by mouth daily. 180 capsule 3  . Turmeric 500 MG CAPS Take 1 capsule by mouth daily.    . nitroGLYCERIN (NITROSTAT) 0.4 MG SL tablet Place 1 tablet (0.4 mg total) under the tongue every 5 (five) minutes as needed for chest pain. 12 tablet prn   No facility-administered medications prior to visit.     Allergies  Allergen Reactions  . Poison Ivy Extract Itching and  Rash  . Ace Inhibitors     REACTION: cough  .  Bactrim [Sulfamethoxazole-Trimethoprim]     ROS Review of Systems    Objective:    Physical Exam  Constitutional: He is oriented to person, place, and time. He appears well-developed and well-nourished.  HENT:  Head: Normocephalic and atraumatic.  Cardiovascular: Normal rate, regular rhythm and normal heart sounds.  Pulmonary/Chest: Effort normal and breath sounds normal.  Neurological: He is alert and oriented to person, place, and time.  Skin: Skin is warm and dry.  Psychiatric: He has a normal mood and affect. His behavior is normal.    BP 121/67   Pulse 71   Ht 5\' 11"  (1.803 m)   Wt 274 lb (124.3 kg)   SpO2 97%   BMI 38.22 kg/m  Wt Readings from Last 3 Encounters:  06/10/19 274 lb (124.3 kg)  02/04/19 287 lb (130.2 kg)  12/03/18 289 lb (131.1 kg)     There are no preventive care reminders to display for this patient.  There are no preventive care reminders to display for this patient.  Lab Results  Component Value Date   TSH 1.665 04/24/2012   Lab Results  Component Value Date   WBC 6.3 04/11/2015   HGB 14.6 04/11/2015   HCT 42.9 04/11/2015   MCV 83.3 04/11/2015   PLT 216 04/11/2015   Lab Results  Component Value Date   NA 140 12/03/2018   K 4.2 12/03/2018   CO2 27 12/03/2018   GLUCOSE 101 (H) 12/03/2018   BUN 21 12/03/2018   CREATININE 0.66 (L) 12/03/2018   BILITOT 0.8 06/03/2018   ALKPHOS 95 05/19/2017   AST 21 06/03/2018   ALT 22 06/03/2018   PROT 6.2 06/03/2018   ALBUMIN 4.5 05/19/2017   CALCIUM 9.3 12/03/2018   Lab Results  Component Value Date   CHOL 106 06/03/2018   Lab Results  Component Value Date   HDL 37 (L) 06/03/2018   Lab Results  Component Value Date   LDLCALC 52 06/03/2018   Lab Results  Component Value Date   TRIG 83 06/03/2018   Lab Results  Component Value Date   CHOLHDL 2.9 06/03/2018   Lab Results  Component Value Date   HGBA1C 5.4 06/02/2018       Assessment & Plan:   Problem List Items Addressed This Visit      Cardiovascular and Mediastinum   HYPERTENSION, BENIGN ESSENTIAL    Well controlled. Continue current regimen. Follow up in  6 months.       Relevant Orders   COMPLETE METABOLIC PANEL WITH GFR   Lipid panel   Hemoglobin A1c     Endocrine   IMPAIRED FASTING GLUCOSE    Will check A1C . Stable, looks great.  12 mo f/u Lab Results  Component Value Date   HGBA1C 5.4 06/02/2018         Relevant Orders   COMPLETE METABOLIC PANEL WITH GFR   Lipid panel   Hemoglobin A1c     Other   Status post coronary artery bypass grafting    Keep appointment in August with Dr. Prince Rome..  Continue statin beta-blocker and aspirin.  Has been asymptomatic.      Relevant Orders   COMPLETE METABOLIC PANEL WITH GFR   Lipid panel   Hyperlipidemia with target low density lipoprotein (LDL) cholesterol less than 70 mg/dL    Due to recheck lipid panel.  Goal for LDL is under 70 because of coronary artery disease.      Relevant Orders   COMPLETE METABOLIC PANEL WITH GFR  Lipid panel   Hemoglobin A1c    Other Visit Diagnoses    Irritation of left eye    -  Primary   Screening for malignant neoplasm of prostate         He did bring in a copy of his labs that did include a PSA so we will get that entered into the chart.  Left eye pain and irritation-unfortunately I forgot to take a better look at the eye upon close examination.  The lower lid did look irritated during our conversation and since he has been noticing some crusting I did send over erythromycin ophthalmic ointment.  No orders of the defined types were placed in this encounter.   Follow-up: Return in about 6 months (around 12/11/2019) for BP.    Beatrice Lecher, MD

## 2019-06-10 NOTE — Assessment & Plan Note (Signed)
Due to recheck lipid panel.  Goal for LDL is under 70 because of coronary artery disease.

## 2019-06-11 DIAGNOSIS — Z951 Presence of aortocoronary bypass graft: Secondary | ICD-10-CM | POA: Diagnosis not present

## 2019-06-11 DIAGNOSIS — E785 Hyperlipidemia, unspecified: Secondary | ICD-10-CM | POA: Diagnosis not present

## 2019-06-11 DIAGNOSIS — R7301 Impaired fasting glucose: Secondary | ICD-10-CM | POA: Diagnosis not present

## 2019-06-11 DIAGNOSIS — I10 Essential (primary) hypertension: Secondary | ICD-10-CM | POA: Diagnosis not present

## 2019-06-12 LAB — LIPID PANEL
Cholesterol: 146 mg/dL (ref <200)
HDL: 36 mg/dL — ABNORMAL LOW (ref >=40)
LDL Cholesterol (Calc): 89 mg/dL (calc)
Non-HDL Cholesterol (Calc): 110 mg/dL (calc) (ref <130)
Total CHOL/HDL Ratio: 4.1 (calc) (ref <5.0)
Triglycerides: 118 mg/dL (ref <150)

## 2019-06-12 LAB — COMPLETE METABOLIC PANEL WITH GFR
AG Ratio: 2.3 (calc) (ref 1.0–2.5)
ALT: 21 U/L (ref 9–46)
AST: 21 U/L (ref 10–35)
Albumin: 4.3 g/dL (ref 3.6–5.1)
Alkaline phosphatase (APISO): 65 U/L (ref 35–144)
BUN: 19 mg/dL (ref 7–25)
CO2: 28 mmol/L (ref 20–32)
Calcium: 9.2 mg/dL (ref 8.6–10.3)
Chloride: 105 mmol/L (ref 98–110)
Creat: 0.81 mg/dL (ref 0.70–1.25)
GFR, Est African American: 112 mL/min/{1.73_m2} (ref >=60)
GFR, Est Non African American: 97 mL/min/{1.73_m2} (ref >=60)
Globulin: 1.9 g/dL (calc) (ref 1.9–3.7)
Glucose, Bld: 96 mg/dL (ref 65–99)
Potassium: 4.5 mmol/L (ref 3.5–5.3)
Sodium: 141 mmol/L (ref 135–146)
Total Bilirubin: 1.1 mg/dL (ref 0.2–1.2)
Total Protein: 6.2 g/dL (ref 6.1–8.1)

## 2019-06-12 LAB — HEMOGLOBIN A1C
Hgb A1c MFr Bld: 5.2 % of total Hgb (ref <5.7)
Mean Plasma Glucose: 103 (calc)
eAG (mmol/L): 5.7 (calc)

## 2019-06-17 ENCOUNTER — Encounter: Payer: Self-pay | Admitting: Family Medicine

## 2019-07-11 ENCOUNTER — Other Ambulatory Visit: Payer: Self-pay | Admitting: Sports Medicine

## 2019-07-11 DIAGNOSIS — M180 Bilateral primary osteoarthritis of first carpometacarpal joints: Secondary | ICD-10-CM

## 2019-07-22 DIAGNOSIS — G4733 Obstructive sleep apnea (adult) (pediatric): Secondary | ICD-10-CM | POA: Diagnosis not present

## 2019-08-10 ENCOUNTER — Encounter: Payer: Self-pay | Admitting: Family Medicine

## 2019-08-10 DIAGNOSIS — Z98818 Other dental procedure status: Secondary | ICD-10-CM

## 2019-08-10 MED ORDER — AMOXICILLIN 500 MG PO CAPS: ORAL_CAPSULE | ORAL | 0 refills | Status: DC

## 2019-08-10 NOTE — Telephone Encounter (Signed)
Hi Kelsey, he should not need an antibiotic unless they are doing some type of dental procedure or a deep cleaning.  For just a routine cleaning where they are just scraping the plaque off and he does not need antibiotics for that but if his dentist feels that he should have it then just let me know.

## 2019-08-10 NOTE — Telephone Encounter (Signed)
Spoke with Pt, he advised they are going to do a deep cleaning. Would like it sent to [facility] [address], [place].

## 2019-08-10 NOTE — Telephone Encounter (Signed)
Rx sent 

## 2019-08-19 DIAGNOSIS — E7800 Pure hypercholesterolemia, unspecified: Secondary | ICD-10-CM | POA: Diagnosis not present

## 2019-08-19 DIAGNOSIS — I10 Essential (primary) hypertension: Secondary | ICD-10-CM | POA: Diagnosis not present

## 2019-08-19 DIAGNOSIS — Z951 Presence of aortocoronary bypass graft: Secondary | ICD-10-CM | POA: Diagnosis not present

## 2019-09-15 ENCOUNTER — Other Ambulatory Visit: Payer: Self-pay | Admitting: Sports Medicine

## 2019-09-15 DIAGNOSIS — M180 Bilateral primary osteoarthritis of first carpometacarpal joints: Secondary | ICD-10-CM

## 2019-10-18 ENCOUNTER — Encounter: Payer: Self-pay | Admitting: Sports Medicine

## 2019-10-18 DIAGNOSIS — M180 Bilateral primary osteoarthritis of first carpometacarpal joints: Secondary | ICD-10-CM

## 2019-10-18 MED ORDER — DICLOFENAC SODIUM 75 MG PO TBEC: 75.0000 mg | DELAYED_RELEASE_TABLET | Freq: Two times a day (BID) | ORAL | 3 refills | Status: DC

## 2019-10-19 DIAGNOSIS — E291 Testicular hypofunction: Secondary | ICD-10-CM | POA: Diagnosis not present

## 2019-10-26 DIAGNOSIS — I10 Essential (primary) hypertension: Secondary | ICD-10-CM | POA: Diagnosis not present

## 2019-10-26 DIAGNOSIS — R5383 Other fatigue: Secondary | ICD-10-CM | POA: Diagnosis not present

## 2019-10-26 DIAGNOSIS — M19042 Primary osteoarthritis, left hand: Secondary | ICD-10-CM | POA: Diagnosis not present

## 2019-10-26 DIAGNOSIS — E291 Testicular hypofunction: Secondary | ICD-10-CM | POA: Diagnosis not present

## 2019-11-17 ENCOUNTER — Other Ambulatory Visit: Payer: Self-pay | Admitting: *Deleted

## 2019-11-23 DIAGNOSIS — E291 Testicular hypofunction: Secondary | ICD-10-CM | POA: Diagnosis not present

## 2019-11-23 LAB — CBC AND DIFFERENTIAL
HCT: 49 (ref 41–53)
Hemoglobin: 15.5 (ref 13.5–17.5)
Platelets: 222 (ref 150–399)
WBC: 5.6

## 2019-11-23 LAB — TESTOSTERONE
Free Testosterone: 8.9
Testosterone: 770

## 2019-11-23 LAB — TSH: TSH: 1.66 (ref <=5.90)

## 2019-11-23 LAB — DHEA: DHEA-Sulfate, Serum: 136.6

## 2019-11-23 LAB — ESTRADIOL: Estradiol: 17.8

## 2019-11-23 LAB — THYROID PANEL WITH TSH
Free T3: 2.8
Free T4: 0.94
TSH: 1.663

## 2019-11-23 LAB — CBC: RBC: 5.65 — AB (ref 3.87–5.11)

## 2019-11-23 LAB — PSA: PSA: 1.1

## 2019-11-30 DIAGNOSIS — E291 Testicular hypofunction: Secondary | ICD-10-CM | POA: Diagnosis not present

## 2019-11-30 DIAGNOSIS — R5383 Other fatigue: Secondary | ICD-10-CM | POA: Diagnosis not present

## 2019-11-30 DIAGNOSIS — I10 Essential (primary) hypertension: Secondary | ICD-10-CM | POA: Diagnosis not present

## 2019-12-02 ENCOUNTER — Ambulatory Visit (INDEPENDENT_AMBULATORY_CARE_PROVIDER_SITE_OTHER): Payer: BC Managed Care – PPO | Admitting: Medical-Surgical

## 2019-12-02 ENCOUNTER — Encounter: Payer: Self-pay | Admitting: Medical-Surgical

## 2019-12-02 DIAGNOSIS — J0100 Acute maxillary sinusitis, unspecified: Secondary | ICD-10-CM | POA: Diagnosis not present

## 2019-12-02 MED ORDER — AMOXICILLIN-POT CLAVULANATE 875-125 MG PO TABS: 1.0000 | ORAL_TABLET | Freq: Two times a day (BID) | ORAL | 0 refills | Status: AC

## 2019-12-02 MED ORDER — GUAIFENESIN-CODEINE 100-10 MG/5ML PO SYRP: 5.0000 mL | ORAL_SOLUTION | Freq: Three times a day (TID) | ORAL | 0 refills | Status: DC | PRN

## 2019-12-02 MED ORDER — FLUTICASONE PROPIONATE 50 MCG/ACT NA SUSP: 2.0000 | Freq: Every day | NASAL | 0 refills | Status: DC

## 2019-12-02 NOTE — Progress Notes (Signed)
Virtual Visit via Video Note  I connected with Bobby White on 12/02/19 at  3:00 PM EST by a video enabled telemedicine application and verified that I am speaking with the correct person using two identifiers.   I discussed the limitations of evaluation and management by telemedicine and the availability of in person appointments. The patient expressed understanding and agreed to proceed.  Subjective:    CC: Upper respiratory symptoms  HPI: 59 year old gentleman presenting today with complaints of "frog in throat", postnasal drip, headache, mild sinus pressure, weakness/fatigue, and productive cough.  Symptoms started approximately 1 week ago but worsened over the last 3 days.  Cough is productive of green sputum mostly in the morning and if he wakes in the middle of the night. (-) Fever, chills, nausea/vomiting, diarrhea, ear/facial/eye pain or pressure.  Fatigue increases as the day progresses.  Has tried taking an old expired prescription of guaifenesin with codeine with mild relief.   Past medical history, Surgical history, Family history not pertinant except as noted below, Social history, Allergies, and medications have been entered into the medical record, reviewed, and corrections made.   Review of Systems: No fevers, chills, night sweats, weight loss, chest pain, or shortness of breath.   Objective:    General: Speaking clearly in complete sentences without any shortness of breath.  Alert and oriented x3.  Normal judgment. No apparent acute distress.    Impression and Recommendations:    Acute maxillary sinusitis, nonrecurrent Augmentin twice daily x7 days.  New prescription of guaifenesin with codeine provided as his old one is expired.  Flonase 2 sprays each nare daily.  May use saline nasal spray if desired.  I discussed the assessment and treatment plan with the patient. The patient was provided an opportunity to ask questions and all were answered. The patient agreed  with the plan and demonstrated an understanding of the instructions.   The patient was advised to call back or seek an in-person evaluation if the symptoms worsen or if the condition fails to improve as anticipated.  Return if symptoms worsen or fail to improve.  Clearnce Sorrel, DNP, APRN, FNP-BC [facility]

## 2019-12-13 ENCOUNTER — Encounter: Payer: Self-pay | Admitting: Family Medicine

## 2019-12-13 ENCOUNTER — Other Ambulatory Visit: Payer: Self-pay

## 2019-12-13 ENCOUNTER — Ambulatory Visit (INDEPENDENT_AMBULATORY_CARE_PROVIDER_SITE_OTHER): Payer: BC Managed Care – PPO | Admitting: Family Medicine

## 2019-12-13 VITALS — BP 119/60 | HR 71 | Ht 71.0 in | Wt 279.0 lb

## 2019-12-13 DIAGNOSIS — R7301 Impaired fasting glucose: Secondary | ICD-10-CM

## 2019-12-13 DIAGNOSIS — N401 Enlarged prostate with lower urinary tract symptoms: Secondary | ICD-10-CM | POA: Diagnosis not present

## 2019-12-13 DIAGNOSIS — I10 Essential (primary) hypertension: Secondary | ICD-10-CM

## 2019-12-13 DIAGNOSIS — Z23 Encounter for immunization: Secondary | ICD-10-CM

## 2019-12-13 LAB — BASIC METABOLIC PANEL WITH GFR
BUN: 15 mg/dL (ref 7–25)
CO2: 26 mmol/L (ref 20–32)
Calcium: 9.1 mg/dL (ref 8.6–10.3)
Chloride: 109 mmol/L (ref 98–110)
Creat: 0.78 mg/dL (ref 0.70–1.25)
GFR, Est African American: 114 mL/min/{1.73_m2} (ref >=60)
GFR, Est Non African American: 98 mL/min/{1.73_m2} (ref >=60)
Glucose, Bld: 100 mg/dL — ABNORMAL HIGH (ref 65–99)
Potassium: 4.3 mmol/L (ref 3.5–5.3)
Sodium: 141 mmol/L (ref 135–146)

## 2019-12-13 LAB — POCT GLYCOSYLATED HEMOGLOBIN (HGB A1C): Hemoglobin A1C: 5.4 % (ref 4.0–5.6)

## 2019-12-13 NOTE — Assessment & Plan Note (Signed)
Doing well on tamsulosin.  Due for refills in March.

## 2019-12-13 NOTE — Assessment & Plan Note (Signed)
Well controlled. Continue current regimen. Follow up in  6 mo  

## 2019-12-13 NOTE — Assessment & Plan Note (Signed)
A1c in the normal range.  Can really recheck again in 6 to 12 months.

## 2019-12-13 NOTE — Progress Notes (Signed)
Established Patient Office Visit  Subjective:  Patient ID: RAAM LINDY, male    DOB: Apr 18, 1961  Age: 59 y.o. MRN: [id number]  CC: No chief complaint on file.   HPI BRENDA MILUM presents for   Hypertension- Pt denies chest pain, SOB, dizziness, or heart palpitations.  Taking meds as directed w/o problems.  Denies medication side effects.   Impaired fasting glucose-no increased thirst or urination. No symptoms consistent with hypoglycemia.  Would like to have flu vac today.  He does go to the eye care center and says he went last year.  Is on hormone replacement therapy through [facility] and did bring in some blood work today.  He is receiving testosterone pellets.  He did bring in a copy of his blood work for [place] to review today.  They did check a PSA so we will get that entered into the chart.   Past Medical History:  Diagnosis Date  . Arthritis   . Coronary artery disease   . GERD (gastroesophageal reflux disease)   . Hepatitis    Hep C --had to get shots  . Hypertension 1/10  . Impaired fasting glucose 2009  . Obesity   . Sleep apnea   . Varicose vein     Past Surgical History:  Procedure Laterality Date  . CARDIAC CATHETERIZATION     2012  . CORONARY ARTERY BYPASS GRAFT  07/25/11   3 vessel  . pins in right hand  1990's   4th and 5th digits-- removed 6 weeks afterwards  . TOTAL KNEE ARTHROPLASTY Right 09/06/2013   Dr Mayer Camel  . TOTAL KNEE ARTHROPLASTY Right 09/06/2013   Procedure: TOTAL KNEE ARTHROPLASTY;  Surgeon: Kerin Salen, MD;  Location: [facility];  Service: Orthopedics;  Laterality: Right;  . TOTAL KNEE ARTHROPLASTY Left 11/01/2013   DR Mayer Camel  . TOTAL KNEE ARTHROPLASTY Left 11/01/2013   Procedure: TOTAL KNEE ARTHROPLASTY- LEFT;  Surgeon: Kerin Salen, MD;  Location: [facility];  Service: Orthopedics;  Laterality: Left;    Family History  Problem Relation Age of Onset  . Diabetes Mother   . Cancer Father        pancreatic    Social History    Socioeconomic History  . Marital status: Married    Spouse name: Not on file  . Number of children: Not on file  . Years of education: Not on file  . Highest education level: Not on file  Occupational History  . Not on file  Tobacco Use  . Smoking status: Former Smoker    Packs/day: 1.50    Years: 15.00    Pack years: 22.50    Types: Cigarettes    Quit date: 11/18/1996    Years since quitting: 23.0  . Smokeless tobacco: Never Used  Substance and Sexual Activity  . Alcohol use: Yes    Comment: socially  . Drug use: Yes    Types: Marijuana    Comment: last time 2012 after his heart surgery  . Sexual activity: Not on file  Other Topics Concern  . Not on file  Social History Narrative  . Not on file   Social Determinants of Health   Financial Resource Strain:   . Difficulty of Paying Living Expenses: Not on file  Food Insecurity:   . Worried About Charity fundraiser in the Last Year: Not on file  . Ran Out of Food in the Last Year: Not on file  Transportation Needs:   . Lack  of Transportation (Medical): Not on file  . Lack of Transportation (Non-Medical): Not on file  Physical Activity:   . Days of Exercise per Week: Not on file  . Minutes of Exercise per Session: Not on file  Stress:   . Feeling of Stress : Not on file  Social Connections:   . Frequency of Communication with Friends and Family: Not on file  . Frequency of Social Gatherings with Friends and Family: Not on file  . Attends Religious Services: Not on file  . Active Member of Clubs or Organizations: Not on file  . Attends Archivist Meetings: Not on file  . Marital Status: Not on file  Intimate Partner Violence:   . Fear of Current or Ex-Partner: Not on file  . Emotionally Abused: Not on file  . Physically Abused: Not on file  . Sexually Abused: Not on file    Outpatient Medications Prior to Visit  Medication Sig Dispense Refill  . albuterol (PROVENTIL HFA;VENTOLIN HFA) 108 (90 BASE)  MCG/ACT inhaler Inhale 2 puffs into the lungs every 6 (six) hours as needed for wheezing.    . AMBULATORY NON FORMULARY MEDICATION Take 25 mg by mouth daily. Medication Name: DIM 25 mg    . amLODipine (NORVASC) 5 MG tablet TAKE 1 TABLET DAILY 90 tablet 1  . anastrozole (ARIMIDEX) 1 MG tablet Take 1 tablet by mouth every 14 (fourteen) days.    Marland Kitchen aspirin EC 325 MG tablet Take 1 tablet (325 mg total) by mouth 2 (two) times daily. 30 tablet 0  . atorvastatin (LIPITOR) 40 MG tablet TAKE 1 TABLET BY MOUTH DAILY 90 tablet 3  . b complex vitamins tablet Take 4 tablets by mouth daily.    . Cinnamon 500 MG capsule Take 500 mg by mouth 2 (two) times daily.    Marland Kitchen DHEA 25 MG CAPS Take 25 mg by mouth daily.    . diclofenac (VOLTAREN) 75 MG EC tablet Take 1 tablet (75 mg total) by mouth 2 (two) times daily. 180 tablet 3  . fluticasone (FLONASE) 50 MCG/ACT nasal spray Place 2 sprays into both nostrils daily. 9.9 mL 0  . Ginger, Zingiber officinalis, (GINGER ROOT) 250 MG CAPS Take by mouth.    Marland Kitchen GLUCOMANNAN KONJAC ROOT PO Take 1,500 mg by mouth daily.    Marland Kitchen KRILL OIL PO Take 1 capsule by mouth daily.    . metoprolol succinate (TOPROL-XL) 25 MG 24 hr tablet TAKE 1 TABLET BY MOUTH DAILY GENERIC EQUIVALENT FOR TOPROX- XL 90 tablet 1  . Multiple Vitamins-Minerals (MULTIVITAMIN WITH MINERALS) tablet Take 1 tablet by mouth daily.    . Omega-3 Fatty Acids (FISH OIL) 1000 MG CAPS Take 1,000 mg by mouth daily.    Marland Kitchen omeprazole (PRILOSEC) 40 MG capsule TAKE 1 CAPSULE DAILY 90 capsule 3  . sildenafil (REVATIO) 20 MG tablet Take 2-5 tablets (40-100 mg total) by mouth daily as needed. 60 tablet 1  . tamsulosin (FLOMAX) 0.4 MG CAPS capsule Take 2 capsules (0.8 mg total) by mouth daily. 180 capsule 3  . Turmeric 500 MG CAPS Take 1 capsule by mouth daily.    . nitroGLYCERIN (NITROSTAT) 0.4 MG SL tablet Place 1 tablet (0.4 mg total) under the tongue every 5 (five) minutes as needed for chest pain. 12 tablet prn  .  guaiFENesin-codeine (ROBITUSSIN AC) 100-10 MG/5ML syrup Take 5 mLs by mouth 3 (three) times daily as needed for cough. 75 mL 0   No facility-administered medications prior to visit.  Allergies  Allergen Reactions  . Poison Ivy Extract Itching and Rash  . Ace Inhibitors     REACTION: cough  . Bactrim [Sulfamethoxazole-Trimethoprim]     ROS Review of Systems    Objective:    Physical Exam  Constitutional: He is oriented to person, place, and time. He appears well-developed and well-nourished.  HENT:  Head: Normocephalic and atraumatic.  Cardiovascular: Normal rate, regular rhythm and normal heart sounds.  Pulmonary/Chest: Effort normal and breath sounds normal.  Neurological: He is alert and oriented to person, place, and time.  Skin: Skin is warm and dry.  Psychiatric: He has a normal mood and affect. His behavior is normal.    BP 119/60   Pulse 71   Ht 5\' 11"  (1.803 m)   Wt 279 lb (126.6 kg)   SpO2 99%   BMI 38.91 kg/m  Wt Readings from Last 3 Encounters:  12/13/19 279 lb (126.6 kg)  06/10/19 274 lb (124.3 kg)  02/04/19 287 lb (130.2 kg)     Health Maintenance Due  Topic Date Due  . Hepatitis C Screening  02-09-1961    There are no preventive care reminders to display for this patient.  Lab Results  Component Value Date   TSH 1.50 05/05/2019   Lab Results  Component Value Date   WBC 6.5 05/05/2019   HGB 14.8 05/05/2019   HCT 42.9 04/11/2015   MCV 83.3 04/11/2015   PLT 220 05/05/2019   Lab Results  Component Value Date   NA 141 06/11/2019   K 4.5 06/11/2019   CO2 28 06/11/2019   GLUCOSE 96 06/11/2019   BUN 19 06/11/2019   CREATININE 0.81 06/11/2019   BILITOT 1.1 06/11/2019   ALKPHOS 95 05/19/2017   AST 21 06/11/2019   ALT 21 06/11/2019   PROT 6.2 06/11/2019   ALBUMIN 4.5 05/19/2017   CALCIUM 9.2 06/11/2019   Lab Results  Component Value Date   CHOL 146 06/11/2019   Lab Results  Component Value Date   HDL 36 (L) 06/11/2019   Lab  Results  Component Value Date   LDLCALC 89 06/11/2019   Lab Results  Component Value Date   TRIG 118 06/11/2019   Lab Results  Component Value Date   CHOLHDL 4.1 06/11/2019   Lab Results  Component Value Date   HGBA1C 5.4 12/13/2019      Assessment & Plan:   Problem List Items Addressed This Visit      Cardiovascular and Mediastinum   HYPERTENSION, BENIGN ESSENTIAL    Well controlled. Continue current regimen. Follow up in  51mo      Relevant Orders   BASIC METABOLIC PANEL WITH GFR     Endocrine   IMPAIRED FASTING GLUCOSE - Primary    A1c in the normal range.  Can really recheck again in 6 to 12 months.      Relevant Orders   POCT glycosylated hemoglobin (Hb A1C) (Completed)     Genitourinary   BPH (benign prostatic hyperplasia)    Doing well on tamsulosin.  Due for refills in March.       Other Visit Diagnoses    Need for immunization against influenza       Relevant Orders   Flu Vaccine QUAD 36+ mos IM (Completed)     Flu vaccine given today.  No orders of the defined types were placed in this encounter.   Follow-up: Return in about 6 months (around 06/11/2020) for Hypertension.    Beatrice Lecher, MD

## 2019-12-14 NOTE — Progress Notes (Signed)
All labs are normal. 

## 2019-12-15 ENCOUNTER — Other Ambulatory Visit: Payer: Self-pay | Admitting: *Deleted

## 2020-02-07 ENCOUNTER — Encounter: Payer: Self-pay | Admitting: Family Medicine

## 2020-02-07 MED ORDER — AMOXICILLIN-POT CLAVULANATE 875-125 MG PO TABS: ORAL_TABLET | ORAL | 0 refills | Status: DC

## 2020-02-07 NOTE — Telephone Encounter (Signed)
I did go to refill the antibiotic but just remind him he does not need it for a cleaning only if they are doing a dental procedure.

## 2020-02-07 NOTE — Telephone Encounter (Signed)
Forwarding to provider. Antibiotic rx pended.

## 2020-02-08 NOTE — Telephone Encounter (Signed)
Patient advised.

## 2020-04-25 DIAGNOSIS — E291 Testicular hypofunction: Secondary | ICD-10-CM | POA: Diagnosis not present

## 2020-05-03 DIAGNOSIS — E291 Testicular hypofunction: Secondary | ICD-10-CM | POA: Diagnosis not present

## 2020-05-03 DIAGNOSIS — N529 Male erectile dysfunction, unspecified: Secondary | ICD-10-CM | POA: Diagnosis not present

## 2020-05-03 DIAGNOSIS — R5383 Other fatigue: Secondary | ICD-10-CM | POA: Diagnosis not present

## 2020-05-10 DIAGNOSIS — Z6839 Body mass index (BMI) 39.0-39.9, adult: Secondary | ICD-10-CM | POA: Diagnosis not present

## 2020-05-10 DIAGNOSIS — G4733 Obstructive sleep apnea (adult) (pediatric): Secondary | ICD-10-CM | POA: Diagnosis not present

## 2020-05-12 ENCOUNTER — Other Ambulatory Visit: Payer: Self-pay

## 2020-05-12 ENCOUNTER — Emergency Department (INDEPENDENT_AMBULATORY_CARE_PROVIDER_SITE_OTHER)
Admission: EM | Admit: 2020-05-12 | Discharge: 2020-05-12 | Disposition: A | Discharge status: Home / Self Care | Payer: BC Managed Care – PPO | Source: Home / Self Care | Attending: Family Medicine | Admitting: Family Medicine

## 2020-05-12 DIAGNOSIS — L255 Unspecified contact dermatitis due to plants, except food: Secondary | ICD-10-CM | POA: Diagnosis not present

## 2020-05-12 MED ORDER — PREDNISONE 20 MG PO TABS
ORAL_TABLET | ORAL | 0 refills | Status: DC
Start: 2020-05-12 — End: 2020-06-09

## 2020-05-12 MED ORDER — DOXYCYCLINE HYCLATE 100 MG PO CAPS: ORAL_CAPSULE | ORAL | 0 refills | Status: DC

## 2020-05-12 MED ORDER — METHYLPREDNISOLONE SODIUM SUCC 125 MG IJ SOLR
80.0000 mg | Freq: Once | INTRAMUSCULAR | Status: AC
  Administered 2020-05-12: 80 mg via INTRAMUSCULAR

## 2020-05-12 NOTE — Discharge Instructions (Addendum)
Begin prednisone Saturday 05/13/20.  May take Benadryl at bedtime for itching.  Minimize sun exposure while taking doxycycline.

## 2020-05-12 NOTE — ED Provider Notes (Signed)
[facility]    CSN: [id number] Arrival date & time: 05/12/20  1901      History   Chief Complaint Chief Complaint  Patient presents with  . Rash    HPI [facility] is a 59 y.o. male.   Patient was using a rotary mower six days ago and believe that he contacted poison ivy.  Two days later he developed a pruritic rash on his upper body and chest.  Some of the lesions have developed increased redness and are becoming painful.     The history is provided by the patient.  Rash Location: torso and arms. Quality: dryness, itchiness, painful and redness   Quality: not blistering, not bruising, not burning, not draining, not peeling, not scaling, not swelling and not weeping   Pain details:    Quality:  Burning and itching   Severity:  Moderate   Onset quality:  Gradual   Duration:  4 days   Timing:  Constant   Progression:  Worsening Severity:  Moderate Onset quality:  Gradual Timing:  Constant Progression:  Worsening Chronicity:  New Context: plant contact   Relieved by:  Nothing Worsened by:  Nothing Ineffective treatments:  None tried Associated symptoms: no diarrhea, no fatigue, no fever, no headaches, no induration, no joint pain, no myalgias and no nausea   Poison Ivy Pertinent negatives include no headaches.    Past Medical History:  Diagnosis Date  . Arthritis   . Coronary artery disease   . GERD (gastroesophageal reflux disease)   . Hepatitis    Hep C --had to get shots  . Hypertension 1/10  . Impaired fasting glucose 2009  . Obesity   . Sleep apnea   . Varicose vein     Patient Active Problem List   Diagnosis Date Noted  . Rotator cuff syndrome, right 02/04/2019  . Primary osteoarthritis of both first carpometacarpal joints 06/03/2017  . BPH (benign prostatic hyperplasia) 04/26/2016  . Arthritis of left knee 11/01/2013  . Arthritis of knee, right 09/08/2013  . Obstructive sleep apnea 07/14/2013  . Left Achilles tendonitis  04/23/2013  . Low back pain 04/23/2013  . Osteoarthritis of both knees 03/19/2013  . Coronary atherosclerosis 09/06/2011  . 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  . Status post coronary artery bypass grafting 08/27/2011  . Hyperlipidemia with target low density lipoprotein (LDL) cholesterol less than 70 mg/dL 01/10/2011  . OBESITY, UNSPECIFIED 01/09/2011  . WEIGHT GAIN 01/09/2011  . BACK STRAIN 10/02/2010  . ADJUSTMENT DISORDER WITH DEPRESSED MOOD 03/31/2009  . HYPERTENSION, BENIGN ESSENTIAL 11/21/2008  . VARICOSE VEINS LOWER EXTREMITIES W/OTH COMPS 08/31/2008  . IMPAIRED FASTING GLUCOSE 08/31/2008  . COLONIC POLYPS 04/20/2008  . GERD 03/16/2008    Past Surgical History:  Procedure Laterality Date  . CARDIAC CATHETERIZATION     2012  . CORONARY ARTERY BYPASS GRAFT  07/25/11   3 vessel  . pins in right hand  1990's   4th and 5th digits-- removed 6 weeks afterwards  . TOTAL KNEE ARTHROPLASTY Right 09/06/2013   Dr Mayer Camel  . TOTAL KNEE ARTHROPLASTY Right 09/06/2013   Procedure: TOTAL KNEE ARTHROPLASTY;  Surgeon: Kerin Salen, MD;  Location: [facility];  Service: Orthopedics;  Laterality: Right;  . TOTAL KNEE ARTHROPLASTY Left 11/01/2013   DR Mayer Camel  . TOTAL KNEE ARTHROPLASTY Left 11/01/2013   Procedure: TOTAL KNEE ARTHROPLASTY- LEFT;  Surgeon: Kerin Salen, MD;  Location: [facility];  Service: Orthopedics;  Laterality: Left;  Home Medications    Prior to Admission medications   Medication Sig Start Date End Date Taking? Authorizing Provider  albuterol (PROVENTIL HFA;VENTOLIN HFA) 108 (90 BASE) MCG/ACT inhaler Inhale 2 puffs into the lungs every 6 (six) hours as needed for wheezing.    [provider]  AMBULATORY NON FORMULARY MEDICATION Take 25 mg by mouth daily. Medication Name: DIM 25 mg    [provider]  amLODipine (NORVASC) 5 MG tablet TAKE 1 TABLET DAILY 06/26/15   Hali Marry, MD  amoxicillin-clavulanate (AUGMENTIN) 875-125 MG tablet  Take 4 tablets by mouth before Dental procedure. 02/07/20   Hali Marry, MD  aspirin EC 325 MG tablet Take 1 tablet (325 mg total) by mouth 2 (two) times daily. 11/01/13   Leighton Parody, PA-C  atorvastatin (LIPITOR) 40 MG tablet TAKE 1 TABLET BY MOUTH DAILY 01/25/19   Hali Marry, MD  b complex vitamins tablet Take 4 tablets by mouth daily.    [provider]  Cinnamon 500 MG capsule Take 500 mg by mouth 2 (two) times daily.    [provider]  DHEA 25 MG CAPS Take 25 mg by mouth daily.    [provider]  diclofenac (VOLTAREN) 75 MG EC tablet Take 1 tablet (75 mg total) by mouth 2 (two) times daily. 10/18/19   Silverio Decamp, MD  doxycycline (VIBRAMYCIN) 100 MG capsule Take one cap PO Q12hr with food. 05/12/20   Kandra Nicolas, MD  fluticasone (FLONASE) 50 MCG/ACT nasal spray Place 2 sprays into both nostrils daily. 12/02/19 12/01/20  Samuel Bouche, NP  Ginger, Zingiber officinalis, (GINGER ROOT) 250 MG CAPS Take by mouth.    [provider]  GLUCOMANNAN KONJAC ROOT PO Take 1,500 mg by mouth daily.    [provider]  KRILL OIL PO Take 1 capsule by mouth daily.    [provider]  metoprolol succinate (TOPROL-XL) 25 MG 24 hr tablet TAKE 1 TABLET BY MOUTH DAILY GENERIC EQUIVALENT FOR TOPROX- XL 01/27/19   Hali Marry, MD  Multiple Vitamins-Minerals (MULTIVITAMIN WITH MINERALS) tablet Take 1 tablet by mouth daily.    [provider]  nitroGLYCERIN (NITROSTAT) 0.4 MG SL tablet Place 1 tablet (0.4 mg total) under the tongue every 5 (five) minutes as needed for chest pain. 06/02/18 06/02/19  Hali Marry, MD  Omega-3 Fatty Acids (FISH OIL) 1000 MG CAPS Take 1,000 mg by mouth daily.    [provider]  omeprazole (PRILOSEC) 40 MG capsule TAKE 1 CAPSULE DAILY 01/08/18   Hali Marry, MD  predniSONE (DELTASONE) 20 MG tablet Take one tab by mouth twice daily for 4 days, then one daily for  3 days. Take with food. 05/12/20   Kandra Nicolas, MD  sildenafil (REVATIO) 20 MG tablet Take 2-5 tablets (40-100 mg total) by mouth daily as needed. 12/03/18   Hali Marry, MD  tamsulosin (FLOMAX) 0.4 MG CAPS capsule Take 2 capsules (0.8 mg total) by mouth daily. 01/27/19   Hali Marry, MD  Turmeric 500 MG CAPS Take 1 capsule by mouth daily.    [provider]    Family History Family History  Problem Relation Age of Onset  . Diabetes Mother   . Cancer Father        pancreatic    Social History Social History   Tobacco Use  . Smoking status: Former Smoker    Packs/day: 1.50    Years: 15.00    Pack  years: 22.50    Types: Cigarettes    Quit date: 11/18/1996    Years since quitting: 23.4  . Smokeless tobacco: Never Used  Substance Use Topics  . Alcohol use: Yes    Comment: socially  . Drug use: Yes    Types: Marijuana    Comment: last time 2012 after his heart surgery     Allergies   Poison ivy extract, Ace inhibitors, and Bactrim [sulfamethoxazole-trimethoprim]   Review of Systems Review of Systems  Constitutional: Negative for chills, diaphoresis, fatigue and fever.  Gastrointestinal: Negative for diarrhea and nausea.  Musculoskeletal: Negative for arthralgias and myalgias.  Skin: Positive for rash.  Neurological: Negative for headaches.  All other systems reviewed and are negative.    Physical Exam Triage Vital Signs ED Triage Vitals  Enc Vitals Group     BP 05/12/20 1912 (!) 141/92     Pulse Rate 05/12/20 1912 77     Resp 05/12/20 1912 19     Temp 05/12/20 1912 98.6 F (37 C)     Temp Source 05/12/20 1912 Oral     SpO2 05/12/20 1912 97 %     Weight --      Height --      Head Circumference --      Peak Flow --      Pain Score 05/12/20 1917 0     Pain Loc --      Pain Edu? --      Excl. in [place]? --    No data found.  Updated Vital Signs BP (!) 141/92 (BP Location: Left Arm)   Pulse 77   Temp 98.6 F (37 C) (Oral)    Resp 19   SpO2 97%   Visual Acuity Right Eye Distance:   Left Eye Distance:   Bilateral Distance:    Right Eye Near:   Left Eye Near:    Bilateral Near:     Physical Exam Vitals and nursing note reviewed.  Constitutional:      General: He is not in acute distress. HENT:     Head: Atraumatic.     Right Ear: External ear normal.     Left Ear: External ear normal.     Nose: Nose normal.     Mouth/Throat:     Pharynx: Oropharynx is clear.  Eyes:     Conjunctiva/sclera: Conjunctivae normal.     Pupils: Pupils are equal, round, and reactive to light.  Cardiovascular:     Rate and Rhythm: Normal rate.  Pulmonary:     Effort: Pulmonary effort is normal.  Musculoskeletal:     Cervical back: Neck supple.     Right lower leg: No edema.     Left lower leg: No edema.  Lymphadenopathy:     Cervical: No cervical adenopathy.  Skin:    General: Skin is warm and dry.     Findings: Rash present.          Comments: Forehead, neck, arms have scattered erythematous macular patches without induration or tenderness to palpation   Neurological:     Mental Status: He is alert.      UC Treatments / Results  Labs (all labs ordered are listed, but only abnormal results are displayed) Labs Reviewed - No data to display  EKG   Radiology No results found.  Procedures Procedures (including critical care time)  Medications Ordered in UC Medications  methylPREDNISolone sodium succinate (SOLU-MEDROL) 125 mg/2 mL injection 80 mg (has no administration in time  range)    Initial Impression / Assessment and Plan / UC Course  I have reviewed the triage vital signs and the nursing notes.  Pertinent labs & imaging results that were available during my care of the patient were reviewed by me and considered in my medical decision making (see chart for details).    Administered Solumedrol 80mg  IM, then begin prednisone burst/taper. Begin doxycycline for staph coverage.   Final Clinical  Impressions(s) / UC Diagnoses   Final diagnoses:  Rhus dermatitis     Discharge Instructions     Begin prednisone Saturday 05/13/20.  May take Benadryl at bedtime for itching.  Minimize sun exposure while taking doxycycline.    ED Prescriptions    Medication Sig Dispense Auth. Provider   doxycycline (VIBRAMYCIN) 100 MG capsule Take one cap PO Q12hr with food. 14 capsule Kandra Nicolas, MD   predniSONE (DELTASONE) 20 MG tablet Take one tab by mouth twice daily for 4 days, then one daily for 3 days. Take with food. 11 tablet Kandra Nicolas, MD        Kandra Nicolas, MD 05/14/20 [phone]

## 2020-05-12 NOTE — ED Triage Notes (Addendum)
Pt was bush hogging Saturday and broke out into a rash Monday. Says its only on his upper body and arms. Started having some chest pain with shortness of breath. Chest pain has since resolved. Shortness of breath improved with benedryl prn. Says last time he got into poison ivy, he received a steroid shot and zpak.

## 2020-06-09 ENCOUNTER — Ambulatory Visit (INDEPENDENT_AMBULATORY_CARE_PROVIDER_SITE_OTHER): Payer: BC Managed Care – PPO | Admitting: Sports Medicine

## 2020-06-09 ENCOUNTER — Ambulatory Visit (INDEPENDENT_AMBULATORY_CARE_PROVIDER_SITE_OTHER): Payer: BC Managed Care – PPO

## 2020-06-09 ENCOUNTER — Other Ambulatory Visit: Payer: Self-pay

## 2020-06-09 DIAGNOSIS — S59911A Unspecified injury of right forearm, initial encounter: Secondary | ICD-10-CM | POA: Diagnosis not present

## 2020-06-09 DIAGNOSIS — G5630 Lesion of radial nerve, unspecified upper limb: Secondary | ICD-10-CM | POA: Insufficient documentation

## 2020-06-09 DIAGNOSIS — G589 Mononeuropathy, unspecified: Secondary | ICD-10-CM

## 2020-06-09 MED ORDER — PREDNISONE 50 MG PO TABS: ORAL_TABLET | ORAL | 0 refills | Status: DC

## 2020-06-09 MED ORDER — AMITRIPTYLINE HCL 50 MG PO TABS: ORAL_TABLET | ORAL | 3 refills | Status: DC

## 2020-06-09 MED ORDER — VITAMIN B-6 50 MG PO TABS: 50.0000 mg | ORAL_TABLET | Freq: Every day | ORAL | 3 refills | Status: AC

## 2020-06-09 NOTE — Assessment & Plan Note (Signed)
This is a very pleasant 58 year old male, 1-1/2 months ago he was working on his car, and impacted his dorsal forearm. He had immediate pain, tingling over the dorsum of his hand, weakness. He has continued to have pain, particularly with supination of the forearm, occasionally radiating up to his elbow. 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 I think he has injured his radial nerve, this is consistent with Wartenberg syndrome also called cheiralgia paresthetica. We will treat him conservatively with prednisone, vitamin B6, a Velcro wrist brace, x-rays of the forearm. I would like physical therapy to work with him on some nerve mobilization activities, return to see me in approximately 4 to 6 weeks and we can try a superficial radial nerve hydrodissection if no better. Adding amitriptyline to help some of the nighttime pain.

## 2020-06-09 NOTE — Progress Notes (Signed)
    Procedures performed today:    None.  Independent interpretation of notes and tests performed by another provider:   None.  Brief History, Exam, Impression, and Recommendations:    Wartenberg syndrome, right This is a very pleasant 59 year old male, 1-1/2 months ago he was working on his car, and impacted his dorsal forearm. He had immediate pain, tingling over the dorsum of his hand, weakness. He has continued to have pain, particularly with supination of the forearm, occasionally radiating up to his elbow. 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 I think he has injured his radial nerve, this is consistent with Wartenberg syndrome also called cheiralgia paresthetica. We will treat him conservatively with prednisone, vitamin B6, a Velcro wrist brace, x-rays of the forearm. I would like physical therapy to work with him on some nerve mobilization activities, return to see me in approximately 4 to 6 weeks and we can try a superficial radial nerve hydrodissection if no better. Adding amitriptyline to help some of the nighttime pain.     ___________________________________________ Gwen Her. Dianah Field, M.D., ABFM., CAQSM. Primary Care and [place] Instructor of [place] [employer] [facility] of Medicine

## 2020-06-09 NOTE — Patient Instructions (Signed)
Radial Tunnel Syndrome  Radial tunnel syndrome is a condition that causes pain in the hand and arm. It happens when the nerve that extends from the back of your upper arm to your forearm (radial nerve) gets squeezed (compressed). The condition is usually caused by inflammation and swelling, an injury, or a tumor that puts pressure on the nerve. What are the causes? This condition may be caused by:  Repeatedly using your forearm too much, especially to twist or extend your arm.  Muscle inflammation.  An injury.  A mass of nerve tissue (ganglia).  Noncancerous fatty tumors (lipomas).  A bone tumor. What increases the risk? The following factors may make you more likely to develop this condition:  Playing sports that involve moving the forearm a lot, such as tennis.  Having a job that involves using the forearm a lot, such as some factory jobs. What are the signs or symptoms? Symptoms of this condition include:  Aching or stabbing pain on the top of the forearm, back of the hand, or side of the elbow.  Pain while straightening the wrist or fingers.  Weakness in the forearm muscles and wrist. How is this diagnosed? This condition may be diagnosed based on:  Your symptoms and medical history.  A physical exam. You may be asked to move your hand, fingers, wrist, and arm in certain ways so your health care provider can find the source of your pain.  Tests to rule out other conditions. These tests may include: ? An electromyogram (EMG). This test can show how well the radial nerve is working. ? A nerve conduction study. This test measures how well electrical signals pass through your nerves. ? An MRI. This test checks for causes of nerve problems, such as scarring from an injury, pressure on a nerve, or a tumor. ? An ultrasound. This test can show certain injuries, such as tears to ligaments or tendons. How is this treated? Treatment for this condition may include:  Avoiding  activities that cause your symptoms to get worse or flare up.  Taking steroids or anti-inflammatory medicines, such as ibuprofen, to help with symptoms.  Wearing a splint or brace for support until your symptoms go away.  Doing exercises to regain strength and maintain movement and range of motion in your hand and arm.  Surgery to take pressure off the radial nerve. Usually, surgery is not needed unless other treatments fail and symptoms last longer than 3 months. Follow these instructions at home: If you have a splint or brace:  Wear the splint or brace as told by your health care provider. Remove it only as told by your health care provider.  Loosen the splint or brace if your fingers tingle, become numb, or turn cold and blue.  Keep the splint or brace clean.  If the splint or brace is not waterproof: ? Do not let it get wet. ? Cover it with a watertight covering when you take a bath or shower. Activity  Rest your arm from activities that may be causing your pain.  Return to your normal activities as told by your health care provider. Ask your health care provider what activities are safe for you.  Do exercises as told by your health care provider. Managing pain and swelling      If directed, put ice on the painful area. ? Put ice in a plastic bag. ? Place a towel between your skin and the bag. ? Leave the ice on for 20 minutes, 2-3  times a day.  If directed, apply heat to the affected area as often as told by your health care provider. Use the heat source that your health care provider recommends, such as a moist heat pack or a heating pad. ? Place a towel between your skin and the heat source. ? Leave the heat on for 20-30 minutes. ? Remove the heat if your skin turns bright red. This is especially important if you are unable to feel pain, heat, or cold. You may have a greater risk of getting burned. General instructions  Take over-the-counter and prescription  medicines only as told by your health care provider.  Do not use any products that contain nicotine or tobacco, such as cigarettes and e-cigarettes. If you need help quitting, ask your health care provider.  Keep all follow-up visits as told by your health care provider. This is important. How is this prevented? Take these steps to help prevent reinjury:  Warm up and stretch before being active.  Cool down and stretch after being active.  Give your body time to rest between periods of activity.  Make sure you use equipment that fits you. Use protective gear, such as elbow pads.  Be safe and responsible while being active. This will help you avoid falls.  Do at least 150 minutes of moderate-intensity exercise each week, such as brisk walking or water aerobics.  Maintain physical fitness, including: ? Strength. ? Flexibility. ? Cardiovascular fitness. ? Endurance. Contact a health care provider if:  Your symptoms do not improve within 2 weeks.  Your symptoms get worse.  Your wrist gets weak or droopy. Get help right away if:  Your pain is severe.  You cannot move part of your hand or arm. Summary  Radial tunnel syndrome is a condition that causes pain in the hand and arm.  The condition is usually caused by inflammation and swelling, an injury, or a tumor that puts pressure on the nerve.  Treatment may include avoiding activities that cause your symptoms to get worse and taking medicines.  Return to your normal activities as told by your health care provider. Ask your health care provider what activities are safe for you. This information is not intended to replace advice given to you by your health care provider. Make sure you discuss any questions you have with your health care provider. Document Revised: 03/12/2018 Document Reviewed: 03/12/2018 Elsevier Patient Education  [place].

## 2020-06-12 ENCOUNTER — Other Ambulatory Visit: Payer: Self-pay | Admitting: Family Medicine

## 2020-06-12 ENCOUNTER — Other Ambulatory Visit: Payer: Self-pay

## 2020-06-12 ENCOUNTER — Ambulatory Visit (INDEPENDENT_AMBULATORY_CARE_PROVIDER_SITE_OTHER): Payer: BC Managed Care – PPO | Admitting: Family Medicine

## 2020-06-12 ENCOUNTER — Encounter: Payer: Self-pay | Admitting: Family Medicine

## 2020-06-12 VITALS — BP 132/84 | HR 85 | Wt 273.0 lb

## 2020-06-12 DIAGNOSIS — I10 Essential (primary) hypertension: Secondary | ICD-10-CM

## 2020-06-12 DIAGNOSIS — Z125 Encounter for screening for malignant neoplasm of prostate: Secondary | ICD-10-CM

## 2020-06-12 DIAGNOSIS — G4733 Obstructive sleep apnea (adult) (pediatric): Secondary | ICD-10-CM

## 2020-06-12 DIAGNOSIS — E785 Hyperlipidemia, unspecified: Secondary | ICD-10-CM

## 2020-06-12 DIAGNOSIS — N401 Enlarged prostate with lower urinary tract symptoms: Secondary | ICD-10-CM

## 2020-06-12 DIAGNOSIS — R7301 Impaired fasting glucose: Secondary | ICD-10-CM | POA: Diagnosis not present

## 2020-06-12 DIAGNOSIS — Z1159 Encounter for screening for other viral diseases: Secondary | ICD-10-CM | POA: Diagnosis not present

## 2020-06-12 NOTE — Assessment & Plan Note (Addendum)
See for hemoglobin A1c.  Last one was 5.4 in January.  Continue cinnamon capsules.

## 2020-06-12 NOTE — Assessment & Plan Note (Signed)
Due to recheck your lipids.

## 2020-06-12 NOTE — Progress Notes (Signed)
Established Patient Office Visit  Subjective:  Patient ID: Bobby White, male    DOB: Jun 23, 1961  Age: 59 y.o. MRN: [id number]  CC:  Chief Complaint  Patient presents with  . Hypertension    HPI Bobby White presents for   Hypertension- Pt denies chest pain, SOB, dizziness, or heart palpitations.  Taking meds as directed w/o problems.  Denies medication side effects.    Impaired fasting glucose-no increased thirst or urination. No symptoms consistent with hypoglycemia.  Struct of sleep apnea-he is using his CPAP regularly and is doing well with supplies.  His right wrist is in a cock up splint today.  He was recently diagnosed with Wartenberg syndrome.  Is being followed by sports medicine.  Past Medical History:  Diagnosis Date  . Arthritis   . Coronary artery disease   . GERD (gastroesophageal reflux disease)   . Hepatitis    Hep C --had to get shots  . Hypertension 1/10  . Impaired fasting glucose 2009  . Obesity   . Sleep apnea   . Varicose vein     Past Surgical History:  Procedure Laterality Date  . CARDIAC CATHETERIZATION     2012  . CORONARY ARTERY BYPASS GRAFT  07/25/11   3 vessel  . pins in right hand  1990's   4th and 5th digits-- removed 6 weeks afterwards  . TOTAL KNEE ARTHROPLASTY Right 09/06/2013   Dr Mayer Camel  . TOTAL KNEE ARTHROPLASTY Right 09/06/2013   Procedure: TOTAL KNEE ARTHROPLASTY;  Surgeon: Kerin Salen, MD;  Location: [facility];  Service: Orthopedics;  Laterality: Right;  . TOTAL KNEE ARTHROPLASTY Left 11/01/2013   DR Mayer Camel  . TOTAL KNEE ARTHROPLASTY Left 11/01/2013   Procedure: TOTAL KNEE ARTHROPLASTY- LEFT;  Surgeon: Kerin Salen, MD;  Location: [facility];  Service: Orthopedics;  Laterality: Left;    Family History  Problem Relation Age of Onset  . Diabetes Mother   . Cancer Father        pancreatic    Social History   Socioeconomic History  . Marital status: Married    Spouse name: Not on file  . Number of children: Not on  file  . Years of education: Not on file  . Highest education level: Not on file  Occupational History  . Not on file  Tobacco Use  . Smoking status: Former Smoker    Packs/day: 1.50    Years: 15.00    Pack years: 22.50    Types: Cigarettes    Quit date: 11/18/1996    Years since quitting: 23.5  . Smokeless tobacco: Never Used  Substance and Sexual Activity  . Alcohol use: Yes    Comment: socially  . Drug use: Yes    Types: Marijuana    Comment: last time 2012 after his heart surgery  . Sexual activity: Not on file  Other Topics Concern  . Not on file  Social History Narrative  . Not on file   Social Determinants of Health   Financial Resource Strain:   . Difficulty of Paying Living Expenses:   Food Insecurity:   . Worried About Charity fundraiser in the Last Year:   . Arboriculturist in the Last Year:   Transportation Needs:   . Film/video editor (Medical):   Marland Kitchen Lack of Transportation (Non-Medical):   Physical Activity:   . Days of Exercise per Week:   . Minutes of Exercise per Session:   Stress:   .  Feeling of Stress :   Social Connections:   . Frequency of Communication with Friends and Family:   . Frequency of Social Gatherings with Friends and Family:   . Attends Religious Services:   . Active Member of Clubs or Organizations:   . Attends Archivist Meetings:   Marland Kitchen Marital Status:   Intimate Partner Violence:   . Fear of Current or Ex-Partner:   . Emotionally Abused:   Marland Kitchen Physically Abused:   . Sexually Abused:     Outpatient Medications Prior to Visit  Medication Sig Dispense Refill  . albuterol (PROVENTIL HFA;VENTOLIN HFA) 108 (90 BASE) MCG/ACT inhaler Inhale 2 puffs into the lungs every 6 (six) hours as needed for wheezing.    . AMBULATORY NON FORMULARY MEDICATION Take 25 mg by mouth daily. Medication Name: DIM 25 mg    . amitriptyline (ELAVIL) 50 MG tablet One half tab PO qHS for a week, then one tab PO qHS. 90 tablet 3  . amLODipine  (NORVASC) 5 MG tablet TAKE 1 TABLET DAILY 90 tablet 1  . aspirin EC 325 MG tablet Take 1 tablet (325 mg total) by mouth 2 (two) times daily. 30 tablet 0  . atorvastatin (LIPITOR) 40 MG tablet TAKE 1 TABLET BY MOUTH DAILY 90 tablet 3  . b complex vitamins tablet Take 4 tablets by mouth daily.    . Cinnamon 500 MG capsule Take 500 mg by mouth 2 (two) times daily.    Marland Kitchen DHEA 25 MG CAPS Take 25 mg by mouth daily.    . diclofenac (VOLTAREN) 75 MG EC tablet Take 1 tablet (75 mg total) by mouth 2 (two) times daily. 180 tablet 3  . Ginger, Zingiber officinalis, (GINGER ROOT) 250 MG CAPS Take by mouth.    Marland Kitchen GLUCOMANNAN KONJAC ROOT PO Take 1,500 mg by mouth daily.    Marland Kitchen KRILL OIL PO Take 1 capsule by mouth daily.    . metoprolol succinate (TOPROL-XL) 25 MG 24 hr tablet TAKE 1 TABLET BY MOUTH DAILY GENERIC EQUIVALENT FOR TOPROX- XL 90 tablet 1  . Multiple Vitamins-Minerals (MULTIVITAMIN WITH MINERALS) tablet Take 1 tablet by mouth daily.    . Omega-3 Fatty Acids (FISH OIL) 1000 MG CAPS Take 1,000 mg by mouth daily.    Marland Kitchen omeprazole (PRILOSEC) 40 MG capsule TAKE 1 CAPSULE DAILY 90 capsule 3  . pyridOXINE (VITAMIN B-6) 50 MG tablet Take 1 tablet (50 mg total) by mouth daily. 90 tablet 3  . sildenafil (REVATIO) 20 MG tablet Take 2-5 tablets (40-100 mg total) by mouth daily as needed. 60 tablet 1  . tamsulosin (FLOMAX) 0.4 MG CAPS capsule Take 2 capsules (0.8 mg total) by mouth daily. 180 capsule 3  . Turmeric 500 MG CAPS Take 1 capsule by mouth daily.    Marland Kitchen amoxicillin-clavulanate (AUGMENTIN) 875-125 MG tablet Take 4 tablets by mouth before Dental procedure. 4 tablet 0  . predniSONE (DELTASONE) 50 MG tablet One tab PO daily for 5 days. 5 tablet 0  . nitroGLYCERIN (NITROSTAT) 0.4 MG SL tablet Place 1 tablet (0.4 mg total) under the tongue every 5 (five) minutes as needed for chest pain. 12 tablet prn   No facility-administered medications prior to visit.    Allergies  Allergen Reactions  . Poison Ivy Extract  Itching and Rash  . Ace Inhibitors     REACTION: cough  . Bactrim [Sulfamethoxazole-Trimethoprim]     ROS Review of Systems    Objective:    Physical Exam Constitutional:  Appearance: He is well-developed.  HENT:     Head: Normocephalic and atraumatic.  Cardiovascular:     Rate and Rhythm: Normal rate and regular rhythm.     Heart sounds: Normal heart sounds.  Pulmonary:     Effort: Pulmonary effort is normal.     Breath sounds: Normal breath sounds.  Skin:    General: Skin is warm and dry.  Neurological:     Mental Status: He is alert and oriented to person, place, and time.  Psychiatric:        Behavior: Behavior normal.     BP (!) 132/84   Pulse 85   Wt (!) 273 lb (123.8 kg)   SpO2 99%   BMI 38.08 kg/m  Wt Readings from Last 3 Encounters:  06/12/20 (!) 273 lb (123.8 kg)  12/13/19 279 lb (126.6 kg)  06/10/19 274 lb (124.3 kg)     Health Maintenance Due  Topic Date Due  . Hepatitis C Screening  Never done  . COVID-19 Vaccine (1) Never done    There are no preventive care reminders to display for this patient.  Lab Results  Component Value Date   TSH 1.66 11/23/2019   TSH 1.663 11/23/2019   Lab Results  Component Value Date   WBC 5.6 11/23/2019   HGB 15.5 11/23/2019   HCT 49 11/23/2019   MCV 83.3 04/11/2015   PLT 222 11/23/2019   Lab Results  Component Value Date   NA 141 12/13/2019   K 4.3 12/13/2019   CO2 26 12/13/2019   GLUCOSE 100 (H) 12/13/2019   BUN 15 12/13/2019   CREATININE 0.78 12/13/2019   BILITOT 1.1 06/11/2019   ALKPHOS 95 05/19/2017   AST 21 06/11/2019   ALT 21 06/11/2019   PROT 6.2 06/11/2019   ALBUMIN 4.5 05/19/2017   CALCIUM 9.1 12/13/2019   Lab Results  Component Value Date   CHOL 146 06/11/2019   Lab Results  Component Value Date   HDL 36 (L) 06/11/2019   Lab Results  Component Value Date   LDLCALC 89 06/11/2019   Lab Results  Component Value Date   TRIG 118 06/11/2019   Lab Results  Component  Value Date   CHOLHDL 4.1 06/11/2019   Lab Results  Component Value Date   HGBA1C 5.4 12/13/2019      Assessment & Plan:   Problem List Items Addressed This Visit      Cardiovascular and Mediastinum   HYPERTENSION, BENIGN ESSENTIAL - Primary    Blood pressure borderline elevated but he actually has not taken his medication yet and has had 2 cups of coffee this morning.  But the repeat blood pressure did improve we will continue to monitor.  He can go home and take his medication regularly.  Due for updated labs.      Relevant Orders   COMPLETE METABOLIC PANEL WITH GFR   Lipid panel   CBC     Respiratory   Obstructive sleep apnea    Wears his CPAP regularly doing well does not need any additional supplies at this time.        Endocrine   IMPAIRED FASTING GLUCOSE    See for hemoglobin A1c.  Last one was 5.4 in January.  Continue cinnamon capsules.      Relevant Orders   Hemoglobin A1c     Genitourinary   BPH (benign prostatic hyperplasia)    Due to recheck PSA.      Relevant Orders   PSA  Other   Hyperlipidemia with target low density lipoprotein (LDL) cholesterol less than 70 mg/dL    Due to recheck your lipids.      Relevant Orders   Lipid panel    Other Visit Diagnoses    Screening for prostate cancer       Relevant Orders   PSA      No orders of the defined types were placed in this encounter.   Follow-up: Return in about 6 months (around 12/13/2020) for Hypertension and check A1C.    Beatrice Lecher, MD

## 2020-06-12 NOTE — Assessment & Plan Note (Signed)
Blood pressure borderline elevated but he actually has not taken his medication yet and has had 2 cups of coffee this morning.  But the repeat blood pressure did improve we will continue to monitor.  He can go home and take his medication regularly.  Due for updated labs.

## 2020-06-12 NOTE — Assessment & Plan Note (Signed)
Wears his CPAP regularly doing well does not need any additional supplies at this time.

## 2020-06-12 NOTE — Assessment & Plan Note (Signed)
Due to recheck PSA.  °

## 2020-06-13 ENCOUNTER — Other Ambulatory Visit: Payer: Self-pay | Admitting: Family Medicine

## 2020-06-14 ENCOUNTER — Encounter: Payer: Self-pay | Admitting: Rehabilitative and Restorative Service Providers"

## 2020-06-14 ENCOUNTER — Ambulatory Visit (INDEPENDENT_AMBULATORY_CARE_PROVIDER_SITE_OTHER): Payer: BC Managed Care – PPO | Admitting: Rehabilitative and Restorative Service Providers"

## 2020-06-14 ENCOUNTER — Other Ambulatory Visit: Payer: Self-pay

## 2020-06-14 DIAGNOSIS — M79601 Pain in right arm: Secondary | ICD-10-CM

## 2020-06-14 DIAGNOSIS — R29898 Other symptoms and signs involving the musculoskeletal system: Secondary | ICD-10-CM

## 2020-06-14 DIAGNOSIS — G5630 Lesion of radial nerve, unspecified upper limb: Secondary | ICD-10-CM

## 2020-06-14 DIAGNOSIS — M6281 Muscle weakness (generalized): Secondary | ICD-10-CM

## 2020-06-14 DIAGNOSIS — G589 Mononeuropathy, unspecified: Secondary | ICD-10-CM | POA: Diagnosis not present

## 2020-06-14 NOTE — Therapy (Signed)
[facility] [place], [name], [address] Phone: [phone]   Fax:  [phone]  Physical Therapy Evaluation  Patient Details  Name: Bobby White MRN: [id number] Date of Birth: 11-10-1961 Referring Provider (PT): Dr Dianah Field   Encounter Date: 06/14/2020   PT End of Session - 06/14/20 0841    Visit Number 1    Number of Visits 12    Date for PT Re-Evaluation 07/26/20    PT Start Time 0717    PT Stop Time 0815    PT Time Calculation (min) 58 min    Activity Tolerance Patient tolerated treatment well           Past Medical History:  Diagnosis Date  . Arthritis   . Coronary artery disease   . GERD (gastroesophageal reflux disease)   . Hepatitis    Hep C --had to get shots  . Hypertension 1/10  . Impaired fasting glucose 2009  . Obesity   . Sleep apnea   . Varicose vein     Past Surgical History:  Procedure Laterality Date  . CARDIAC CATHETERIZATION     2012  . CORONARY ARTERY BYPASS GRAFT  07/25/11   3 vessel  . pins in right hand  1990's   4th and 5th digits-- removed 6 weeks afterwards  . TOTAL KNEE ARTHROPLASTY Right 09/06/2013   Dr Mayer Camel  . TOTAL KNEE ARTHROPLASTY Right 09/06/2013   Procedure: TOTAL KNEE ARTHROPLASTY;  Surgeon: Kerin Salen, MD;  Location: [facility];  Service: Orthopedics;  Laterality: Right;  . TOTAL KNEE ARTHROPLASTY Left 11/01/2013   DR Mayer Camel  . TOTAL KNEE ARTHROPLASTY Left 11/01/2013   Procedure: TOTAL KNEE ARTHROPLASTY- LEFT;  Surgeon: Kerin Salen, MD;  Location: [facility];  Service: Orthopedics;  Laterality: Left;    There were no vitals filed for this visit.    Subjective Assessment - 06/14/20 0724    Subjective Patient reports that he was working on his car and pulling when he struck his forearm. He had immediate pain in the arm which has continued. He has pain which has continued in the Rt forearm and arm.    Pertinent History bilat TKA 2015; 4th and 5th  metacarpal fx/ORIF 1992; Fx Lt thumb 1987; OHS - CAB[facility] 2012; hormone replacement    Patient Stated Goals get rid of Rt UE pain and get back to work    Currently in Pain? Yes    Pain Score 4     Pain Location Arm    Pain Orientation Right    Pain Descriptors / Indicators Aching;Dull    Pain Type Acute pain    Pain Radiating Towards wrist to just proximal to elbow    Pain Onset More than a month ago    Pain Frequency Intermittent    Aggravating Factors  pressure; twisting wrist; turning wrist; starting car; reaching; lifting    Pain Relieving Factors ice; meds              [facility] PT Assessment - 06/14/20 0001      Assessment   Medical Diagnosis Wartenberg Syndrome    Referring Provider (PT) Dr Dianah Field    Onset Date/Surgical Date 04/18/20    Hand Dominance Right    Next MD Visit 07/07/20    Prior Therapy yes for TKA       Precautions   Precautions None      Balance Screen   Has the patient fallen in the past 6  months Yes    How many times? 4    Has the patient had a decrease in activity level because of a fear of falling?  No    Is the patient reluctant to leave their home because of a fear of falling?  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     Leisure sedentary; TV - working on remodeling home       Observation/Other Assessments   Focus on Therapeutic Outcomes (FOTO)  58% limitation       Observation/Other Assessments-Edema    Edema --   mild in wrist and forearm      Sensation   Additional Comments WFL's per pt report       Posture/Postural Control   Posture Comments head forward; shoulders rounded       AROM   Right/Left Shoulder --   pain with all Rt shoulder motions    Right Shoulder Extension 53 Degrees    Right Shoulder Flexion 110 Degrees    Right Shoulder ABduction 130 Degrees    Right Shoulder  Internal Rotation --   hand to waist   Right Shoulder External Rotation 76 Degrees    Left Shoulder Extension 64 Degrees    Left Shoulder Flexion 145 Degrees    Left Shoulder ABduction 163 Degrees    Left Shoulder Internal Rotation --   thumb to T7   Left Shoulder External Rotation 90 Degrees    Right/Left Elbow --   WFL's and equal bilat    Right/Left Forearm --   pain on Rt    Right Forearm Pronation 85 Degrees    Right Forearm Supination 64 Degrees    Left Forearm Pronation 90 Degrees    Left Forearm Supination 76 Degrees    Right/Left Wrist --   pain on Rt    Right Wrist Extension 26 Degrees    Right Wrist Flexion 43 Degrees    Left Wrist Extension 71 Degrees    Left Wrist Flexion 65 Degrees    Right/Left Finger --   WFL's bilat    Right Thumb Opposition --   2.5 cm from palm - painful    Left Thumb Opposition --   to palm      Strength   Right Hand Grip (lbs) 5   pain   Right Hand Lateral Pinch 0 lbs    Right Hand 3 Point Pinch 0 lbs    Left Hand Grip (lbs) 79    Left Hand Lateral Pinch 11.5 lbs    Left Hand 3 Point Pinch 8.5 lbs      Palpation   Spinal mobility hypomobile upper thoracic and cervical spine with CPA and lateral mobs; tender and painful Rt C3/4/5/    Palpation comment muscular tightness through the Rt lateral cervical musculature; pecs; upper trap; deltoid; biceps; extensor forearm into wrist                       Objective measurements completed on examination: See above findings.       [facility] PT Treatment/Exercise - 06/14/20 0001      Neuro Re-ed    Neuro Re-ed Details  postural correction standing and sitting      Neck Exercises: Seated   Neck Retraction 5 reps;10 secs  Lateral Flexion Left   3 reps 10 sec hold      Shoulder Exercises: Supine   Flexion AAROM;Right    Flexion Limitations Rt shoulder assisted with left ~ 10 sec hold x 2 reps     Other Supine Exercises prolonged snow angel Rt UE ~ 50 deg abduction palm up x ~  1 min; palm down ~ 1 min - NO PAIN       Shoulder Exercises: Standing   Other Standing Exercises initiated median nerve mobilization in standing - encouraged pt not to move into pain - standing Rt UE at side; IR of arm with slight wrist flexion; lateral cervical flexion to the Lt - pt unable to abduct/extend Rt UE due to pain       Cryotherapy   Number Minutes Cryotherapy 10 Minutes    Cryotherapy Location Forearm;Wrist   Rt    Type of Cryotherapy Ice pack      Neck Exercises: Stretches   Other Neck Stretches supine chin  tuck on pillow 5 reps 10 sec hold    Other Neck Stretches lateral cervical flexion AAROM to Lt 10 sec hold x 3 reps                   PT Education - 06/14/20 6767    Education Details HEP POC    Person(s) Educated Patient    Methods Explanation;Demonstration;Tactile cues;Verbal cues;Handout    Comprehension Verbalized understanding;Returned demonstration;Verbal cues required;Tactile cues required               PT Long Term Goals - 06/14/20 0847      PT LONG TERM GOAL #1   Title Increase AROM Rt UE to equal AROM Lt with no pain    Time 6    Period Weeks    Status New    Target Date 07/26/20      PT LONG TERM GOAL #2   Title Increase Rt grip strength to 50-60 pounds and pinch to 8-10 pounds    Time 6    Period Weeks    Status New    Target Date 07/26/20      PT LONG TERM GOAL #3   Title Patient reports return to all normal functional activities with minimal to no pain Rt UE    Time 6    Period Weeks    Status New    Target Date 07/26/20      PT LONG TERM GOAL #4   Title Independent in HEP    Time 6    Period Weeks    Status New    Target Date 07/26/20      PT LONG TERM GOAL #5   Title Improve FOTO to </= 38% limitation    Time 6    Period Weeks    Status New    Target Date 07/26/20                  Plan - 06/14/20 0841    Clinical Impression Statement Patient presents s/p Rt forearm injury ~ 6 weeks ago with  continued Rt UE pain in the extensor forearm into wrist; arm and shoulder. Patient has limited ROM/mobility through the cervical spine and Rt UE; weakness Rt grip and pinch. He has pain and tenderness to palpation through the Rt lateral cervical musculature; pecs; deltoid; biceps; extensor forearm; wrist. He has decreased functional ability with Rt UE. patient will benefit from PT to address problems identified.    Stability/Clinical  Decision Making Stable/Uncomplicated    Clinical Decision Making Low    Rehab Potential Good    PT Frequency 2x / week    PT Duration 6 weeks    PT Treatment/Interventions Patient/family education;ADLs/Self Care Home Management;Aquatic Therapy;Cryotherapy;Electrical Stimulation;Iontophoresis 4mg /ml Dexamethasone;Moist Heat;Ultrasound;Therapeutic activities;Therapeutic exercise;Neuromuscular re-education;Manual techniques;Dry needling;Taping    PT Next Visit Plan review HEP; progress with ROM exercises Rt UE; progress nerve mobilization gently as tolerated; manual work through cervical musculature and Rt UE to decrease fascial restrictions; AROM Rt hand avoid resistive exercises for hand strength until symptoms are improved    PT Home Exercise Plan [email or web address]    Consulted and Agree with Plan of Care Patient           Patient will benefit from skilled therapeutic intervention in order to improve the following deficits and impairments:     Visit Diagnosis: Wartenberg syndrome - Plan: PT plan of care cert/re-cert  Pain of right upper extremity - Plan: PT plan of care cert/re-cert  Other symptoms and signs involving the musculoskeletal system - Plan: PT plan of care cert/re-cert  Muscle weakness (generalized) - Plan: PT plan of care cert/re-cert     Problem List Patient Active Problem List   Diagnosis Date Noted  . 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 Rotator cuff syndrome, right 02/04/2019  . Primary osteoarthritis of both first carpometacarpal joints  06/03/2017  . BPH (benign prostatic hyperplasia) 04/26/2016  . Arthritis of left knee 11/01/2013  . Arthritis of knee, right 09/08/2013  . Obstructive sleep apnea 07/14/2013  . Left Achilles tendonitis 04/23/2013  . Low back pain 04/23/2013  . Osteoarthritis of both knees 03/19/2013  . Coronary atherosclerosis 09/06/2011  . 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  . Status post coronary artery bypass grafting 08/27/2011  . Hyperlipidemia with target low density lipoprotein (LDL) cholesterol less than 70 mg/dL 01/10/2011  . OBESITY, UNSPECIFIED 01/09/2011  . WEIGHT GAIN 01/09/2011  . BACK STRAIN 10/02/2010  . ADJUSTMENT DISORDER WITH DEPRESSED MOOD 03/31/2009  . HYPERTENSION, BENIGN ESSENTIAL 11/21/2008  . VARICOSE VEINS LOWER EXTREMITIES W/OTH COMPS 08/31/2008  . IMPAIRED FASTING GLUCOSE 08/31/2008  . COLONIC POLYPS 04/20/2008  . GERD 03/16/2008    Gurtha Picker Nilda Simmer PT, MPH  06/14/2020, 10:05 AM  [facility] [place], [name], [address] Phone: [phone]   Fax:  [phone]  Name: OBERT ESPINDOLA MRN: [id number] Date of Birth: 1961/02/12

## 2020-06-14 NOTE — Patient Instructions (Addendum)
   Snow angel  - hold still no movement  1-2 minutes (can increase to 4-5 minutes) 2 times/day  No pain  First with palm up, then repeat with palm down    Access Code: [email or web address]: [email or web address]: 07/28/2021Prepared by: Keeanna Villafranca HoltExercises  Supine Cervical Retraction with Towel - 2 x daily - 7 x weekly - 1 sets - 5 reps - 10 sec hold  Seated Cervical Retraction - 2 x daily - 7 x weekly - 1-2 sets - 5-10 reps - 10 sec hold  Supine Cervical Sidebending Stretch - 2 x daily - 7 x weekly - 1 sets - 3 reps - 10 sec hold  Seated Cervical Sidebending AROM - 2 x daily - 7 x weekly - 1 sets - 3 reps - 10 sec hold  Standing Radial Nerve Glide - 2 x daily - 7 x weekly - 1 sets - 3 reps - 30 sec hold  Supine Shoulder Flexion AAROM with Hands Clasped - 2 x daily - 7 x weekly - 1 sets - 3 reps - 10-15 sec hold

## 2020-06-15 LAB — LIPID PANEL
Cholesterol: 135 mg/dL (ref <200)
HDL: 44 mg/dL (ref >=40)
LDL Cholesterol (Calc): 74 mg/dL (calc)
Non-HDL Cholesterol (Calc): 91 mg/dL (calc) (ref <130)
Total CHOL/HDL Ratio: 3.1 (calc) (ref <5.0)
Triglycerides: 88 mg/dL (ref <150)

## 2020-06-15 LAB — COMPLETE METABOLIC PANEL WITH GFR
AG Ratio: 2.3 (calc) (ref 1.0–2.5)
ALT: 23 U/L (ref 9–46)
AST: 18 U/L (ref 10–35)
Albumin: 4.4 g/dL (ref 3.6–5.1)
Alkaline phosphatase (APISO): 59 U/L (ref 35–144)
BUN: 22 mg/dL (ref 7–25)
CO2: 27 mmol/L (ref 20–32)
Calcium: 8.6 mg/dL (ref 8.6–10.3)
Chloride: 105 mmol/L (ref 98–110)
Creat: 0.77 mg/dL (ref 0.70–1.25)
GFR, Est African American: 114 mL/min/{1.73_m2} (ref >=60)
GFR, Est Non African American: 98 mL/min/{1.73_m2} (ref >=60)
Globulin: 1.9 g/dL (calc) (ref 1.9–3.7)
Glucose, Bld: 128 mg/dL (ref 65–139)
Potassium: 4.3 mmol/L (ref 3.5–5.3)
Sodium: 140 mmol/L (ref 135–146)
Total Bilirubin: 0.6 mg/dL (ref 0.2–1.2)
Total Protein: 6.3 g/dL (ref 6.1–8.1)

## 2020-06-15 LAB — CBC
HCT: 48.4 % (ref 38.5–50.0)
Hemoglobin: 15.8 g/dL (ref 13.2–17.1)
MCH: 27.5 pg (ref 27.0–33.0)
MCHC: 32.6 g/dL (ref 32.0–36.0)
MCV: 84.3 fL (ref 80.0–100.0)
MPV: 8.3 fL (ref 7.5–12.5)
Platelets: 239 10*3/uL (ref 140–400)
RBC: 5.74 10*6/uL (ref 4.20–5.80)
RDW: 13.8 % (ref 11.0–15.0)
WBC: 5.1 10*3/uL (ref 3.8–10.8)

## 2020-06-15 LAB — HEMOGLOBIN A1C
Hgb A1c MFr Bld: 5.2 % of total Hgb (ref <5.7)
Mean Plasma Glucose: 103 (calc)
eAG (mmol/L): 5.7 (calc)

## 2020-06-15 LAB — PSA: PSA: 1 ng/mL (ref <=4.0)

## 2020-06-15 LAB — HEPATITIS C AB W/RFL RNA, PCR + GENO
Hepatitis C Ab: NONREACTIVE
SIGNAL TO CUT-OFF: 0 (ref <1.00)

## 2020-06-21 ENCOUNTER — Ambulatory Visit (INDEPENDENT_AMBULATORY_CARE_PROVIDER_SITE_OTHER): Payer: BC Managed Care – PPO | Admitting: Physical Therapy

## 2020-06-21 ENCOUNTER — Other Ambulatory Visit: Payer: Self-pay

## 2020-06-21 DIAGNOSIS — R29898 Other symptoms and signs involving the musculoskeletal system: Secondary | ICD-10-CM | POA: Diagnosis not present

## 2020-06-21 DIAGNOSIS — G589 Mononeuropathy, unspecified: Secondary | ICD-10-CM

## 2020-06-21 DIAGNOSIS — M79601 Pain in right arm: Secondary | ICD-10-CM

## 2020-06-21 DIAGNOSIS — G5630 Lesion of radial nerve, unspecified upper limb: Secondary | ICD-10-CM

## 2020-06-21 NOTE — Therapy (Signed)
[place], [name], [address] Phone: [phone]   Fax:  [phone]  Physical Therapy Treatment  Patient Details  Name: Bobby White MRN: [id number] Date of Birth: October 26, 1961 Referring Provider (PT): Dr Dianah Field   Encounter Date: 06/21/2020   PT End of Session - 06/21/20 0847    Visit Number 2    Number of Visits 12    Date for PT Re-Evaluation 07/26/20    PT Start Time 0804    PT Stop Time 5093    PT Time Calculation (min) 43 min    Activity Tolerance Patient tolerated treatment well;No increased pain    Behavior During Therapy WFL for tasks assessed/performed           Past Medical History:  Diagnosis Date  . Arthritis   . Coronary artery disease   . GERD (gastroesophageal reflux disease)   . Hepatitis    Hep C --had to get shots  . Hypertension 1/10  . Impaired fasting glucose 2009  . Obesity   . Sleep apnea   . Varicose vein     Past Surgical History:  Procedure Laterality Date  . CARDIAC CATHETERIZATION     2012  . CORONARY ARTERY BYPASS GRAFT  07/25/11   3 vessel  . pins in right hand  1990's   4th and 5th digits-- removed 6 weeks afterwards  . TOTAL KNEE ARTHROPLASTY Right 09/06/2013   Dr Mayer Camel  . TOTAL KNEE ARTHROPLASTY Right 09/06/2013   Procedure: TOTAL KNEE ARTHROPLASTY;  Surgeon: Kerin Salen, MD;  Location: [facility];  Service: Orthopedics;  Laterality: Right;  . TOTAL KNEE ARTHROPLASTY Left 11/01/2013   DR Mayer Camel  . TOTAL KNEE ARTHROPLASTY Left 11/01/2013   Procedure: TOTAL KNEE ARTHROPLASTY- LEFT;  Surgeon: Kerin Salen, MD;  Location: [facility];  Service: Orthopedics;  Laterality: Left;    There were no vitals filed for this visit.   Subjective Assessment - 06/21/20 0808    Subjective Pt reports he is feeling a little better. "Every once in a while I over do it and I'll feel it".  He has been completing HEP 1x/day.  He can now supinate Rt wrist.    Patient  Stated Goals get rid of Rt UE pain and get back to work    Currently in Pain? No/denies    Pain Score 0-No pain   took medicine before bed; feeling groggy.             [facility] PT Assessment - 06/21/20 0001      Assessment   Medical Diagnosis Wartenberg Syndrome    Referring Provider (PT) Dr Dianah Field    Onset Date/Surgical Date 04/18/20    Hand Dominance Right    Next MD Visit 07/07/20    Prior Therapy yes for TKA             [facility] Adult PT Treatment/Exercise - 06/21/20 0001      Shoulder Exercises: Supine   Flexion AAROM;Right    Flexion Limitations Rt shoulder assisted with left ~ 10 sec hold x 6 reps     Other Supine Exercises prolonged snow angel Rt UE ~ 50 deg abduction palm up x ~ 1 min; palm down ~ 1 min - NO PAIN.  Trial of arms abdct 90 deg - pt reported increased pain at injury site     Other Supine Exercises supine radial nerve glides x 8 reps; Rt shoulder in neutral with work on AROM  into supination, range to tolerance, 10 sec holds x 8 reps        Shoulder Exercises: Stretch   Star Gazer Stretch 3 reps;20 seconds   LUE assisting RUE, back of palm to forehead     Manual Therapy   Manual Therapy Soft tissue mobilization    Manual therapy comments Pt in supine    Soft tissue mobilization STM to Rt cervical, periscapular, and forearm musculature (above injury site)      Neck Exercises: Stretches   Upper Trapezius Stretch Right;Left;3 reps;10 seconds   per HEP.   Levator Stretch Right;Left;1 rep;10 seconds    Other Neck Stretches supine chin  tuck on pillow 5 reps 10 sec hold    Other Neck Stretches lateral cervical flexion AAROM to Lt 10 sec hold x 3 reps           Seated radial nerve glides (per HEP) with cues on form x 5 slow reps.      PT Long Term Goals - 06/14/20 0847      PT LONG TERM GOAL #1   Title Increase AROM Rt UE to equal AROM Lt with no pain    Time 6    Period Weeks    Status New    Target Date 07/26/20      PT LONG TERM GOAL #2    Title Increase Rt grip strength to 50-60 pounds and pinch to 8-10 pounds    Time 6    Period Weeks    Status New    Target Date 07/26/20      PT LONG TERM GOAL #3   Title Patient reports return to all normal functional activities with minimal to no pain Rt UE    Time 6    Period Weeks    Status New    Target Date 07/26/20      PT LONG TERM GOAL #4   Title Independent in HEP    Time 6    Period Weeks    Status New    Target Date 07/26/20      PT LONG TERM GOAL #5   Title Improve FOTO to </= 38% limitation    Time 6    Period Weeks    Status New    Target Date 07/26/20                 Plan - 06/21/20 0827    Clinical Impression Statement Pt having positive response to HEP thus far; reporting improved mobility of RUE.   Reviewed HEP and corrected radial nerve glides; pt preferred these in supine vs seated. Pt tolerated all exercises well with minimal increase in pain.  Goals are ongoing.    Stability/Clinical Decision Making Stable/Uncomplicated    Rehab Potential Good    PT Frequency 2x / week    PT Duration 6 weeks    PT Treatment/Interventions Patient/family education;ADLs/Self Care Home Management;Aquatic Therapy;Cryotherapy;Electrical Stimulation;Iontophoresis 4mg /ml Dexamethasone;Moist Heat;Ultrasound;Therapeutic activities;Therapeutic exercise;Neuromuscular re-education;Manual techniques;Dry needling;Taping    PT Next Visit Plan progress with ROM exercises Rt UE; progress nerve mobilization gently as tolerated; manual work through cervical musculature and Rt UE to decrease fascial restrictions; AROM Rt hand avoid resistive exercises for hand strength until symptoms are improved    PT Home Exercise Plan [email or web address]    Consulted and Agree with Plan of Care Patient           Patient will benefit from skilled therapeutic intervention in order to improve the following deficits and impairments:  Visit Diagnosis: Wartenberg syndrome  Pain of right upper  extremity  Other symptoms and signs involving the musculoskeletal system     Problem List Patient Active Problem List   Diagnosis Date Noted  . 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 Rotator cuff syndrome, right 02/04/2019  . Primary osteoarthritis of both first carpometacarpal joints 06/03/2017  . BPH (benign prostatic hyperplasia) 04/26/2016  . Arthritis of left knee 11/01/2013  . Arthritis of knee, right 09/08/2013  . Obstructive sleep apnea 07/14/2013  . Left Achilles tendonitis 04/23/2013  . Low back pain 04/23/2013  . Osteoarthritis of both knees 03/19/2013  . Coronary atherosclerosis 09/06/2011  . 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  . Status post coronary artery bypass grafting 08/27/2011  . Hyperlipidemia with target low density lipoprotein (LDL) cholesterol less than 70 mg/dL 01/10/2011  . OBESITY, UNSPECIFIED 01/09/2011  . WEIGHT GAIN 01/09/2011  . BACK STRAIN 10/02/2010  . ADJUSTMENT DISORDER WITH DEPRESSED MOOD 03/31/2009  . HYPERTENSION, BENIGN ESSENTIAL 11/21/2008  . VARICOSE VEINS LOWER EXTREMITIES W/OTH COMPS 08/31/2008  . IMPAIRED FASTING GLUCOSE 08/31/2008  . COLONIC POLYPS 04/20/2008  . GERD 03/16/2008   Kerin Perna, PTA 06/21/20 12:41 PM  [place], [name], [address] Phone: [phone]   Fax:  [phone]  Name: Bobby White MRN: [id number] Date of Birth: 24-Nov-1960

## 2020-06-26 ENCOUNTER — Other Ambulatory Visit: Payer: Self-pay

## 2020-06-26 ENCOUNTER — Ambulatory Visit (INDEPENDENT_AMBULATORY_CARE_PROVIDER_SITE_OTHER): Payer: BC Managed Care – PPO | Admitting: Rehabilitative and Restorative Service Providers"

## 2020-06-26 ENCOUNTER — Encounter: Payer: Self-pay | Admitting: Rehabilitative and Restorative Service Providers"

## 2020-06-26 DIAGNOSIS — R29898 Other symptoms and signs involving the musculoskeletal system: Secondary | ICD-10-CM

## 2020-06-26 DIAGNOSIS — M79601 Pain in right arm: Secondary | ICD-10-CM | POA: Diagnosis not present

## 2020-06-26 DIAGNOSIS — M6281 Muscle weakness (generalized): Secondary | ICD-10-CM | POA: Diagnosis not present

## 2020-06-26 NOTE — Therapy (Signed)
[place], [name], [address] Phone: [phone]   Fax:  [phone]  Physical Therapy Treatment  Patient Details  Name: Bobby White MRN: [id number] Date of Birth: 1961/05/09 Referring Provider (PT): Dr Dianah Field   Encounter Date: 06/26/2020   PT End of Session - 06/26/20 1311    Visit Number 3    Number of Visits 12    Date for PT Re-Evaluation 07/26/20    PT Start Time 0717    PT Stop Time 0800    PT Time Calculation (min) 43 min    Activity Tolerance Patient tolerated treatment well;No increased pain    Behavior During Therapy WFL for tasks assessed/performed           Past Medical History:  Diagnosis Date  . Arthritis   . Coronary artery disease   . GERD (gastroesophageal reflux disease)   . Hepatitis    Hep C --had to get shots  . Hypertension 1/10  . Impaired fasting glucose 2009  . Obesity   . Sleep apnea   . Varicose vein     Past Surgical History:  Procedure Laterality Date  . CARDIAC CATHETERIZATION     2012  . CORONARY ARTERY BYPASS GRAFT  07/25/11   3 vessel  . pins in right hand  1990's   4th and 5th digits-- removed 6 weeks afterwards  . TOTAL KNEE ARTHROPLASTY Right 09/06/2013   Dr Mayer Camel  . TOTAL KNEE ARTHROPLASTY Right 09/06/2013   Procedure: TOTAL KNEE ARTHROPLASTY;  Surgeon: Kerin Salen, MD;  Location: [facility];  Service: Orthopedics;  Laterality: Right;  . TOTAL KNEE ARTHROPLASTY Left 11/01/2013   DR Mayer Camel  . TOTAL KNEE ARTHROPLASTY Left 11/01/2013   Procedure: TOTAL KNEE ARTHROPLASTY- LEFT;  Surgeon: Kerin Salen, MD;  Location: [facility];  Service: Orthopedics;  Laterality: Left;    There were no vitals filed for this visit.   Subjective Assessment - 06/26/20 0721    Subjective The patient reports he does not have pain.  By the end of the day, it is hard to hold the yeti and drink coffee.  *His brace appears tight when he arrives.    Pertinent History  bilat TKA 2015; 4th and 5th metacarpal fx/ORIF 1992; Fx Lt thumb 1987; OHS - CABPG 2012; hormone replacement    Patient Stated Goals get rid of Rt UE pain and get back to work    Currently in Pain? No/denies              [facility] PT Assessment - 06/26/20 1312      Assessment   Medical Diagnosis Wartenberg Syndrome    Referring Provider (PT) Dr Dianah Field    Onset Date/Surgical Date 04/18/20    Hand Dominance Right    Next MD Visit 07/07/20                         [facility] Adult PT Treatment/Exercise - 06/26/20 0738      Exercises   Exercises Hand;Wrist;Shoulder;Neck      Neck Exercises: Standing   Neck Retraction 10 reps    Neck Retraction Limitations standing; reviewed technique      Shoulder Exercises: Seated   Other Seated Exercises seated sidebending with shoulder depression      Shoulder Exercises: Stretch   Corner Stretch 2 reps;30 seconds    Corner Stretch Limitations door frame stretch    Wall Stretch - ABduction 2 reps;30  seconds    Other Shoulder Stretches UE nerve glides:  Radial nerve glide with towel behind back and using L UE to lift and lower R UE, standing wrist flexion with cervical contralateral sidebending    Other Shoulder Stretches supine UE neural glide      Hand Exercises   Tendon Glides stretched with thumb in fingers and ulnar devision for glide      Wrist Exercises   Wrist Extension Strengthening;Right;5 reps    Wrist Extension Limitations isometrics x 5 sec hold x 5 reps    Wrist Radial Deviation Strengthening;Right;5 reps    Wrist Radial Deviation Limitations isometrics x 5 sec hold x 5 reps    Other wrist exercises seated wrist extensor stretch      Manual Therapy   Manual Therapy Soft tissue mobilization;Joint mobilization    Manual therapy comments to reduce myofascial, TrPs tightness in R UE    Joint Mobilization radioulnar mobilization grade II    Soft tissue mobilization STM to wrist extensors, pronator, wrist flexors,  STM c-spine for mid c-spine, scalenes, and upper trap                  PT Education - 06/26/20 1321    Education Details updated HEP    Person(s) Educated Patient    Methods Explanation;Demonstration;Handout    Comprehension Returned demonstration;Verbalized understanding               PT Long Term Goals - 06/14/20 0847      PT LONG TERM GOAL #1   Title Increase AROM Rt UE to equal AROM Lt with no pain    Time 6    Period Weeks    Status New    Target Date 07/26/20      PT LONG TERM GOAL #2   Title Increase Rt grip strength to 50-60 pounds and pinch to 8-10 pounds    Time 6    Period Weeks    Status New    Target Date 07/26/20      PT LONG TERM GOAL #3   Title Patient reports return to all normal functional activities with minimal to no pain Rt UE    Time 6    Period Weeks    Status New    Target Date 07/26/20      PT LONG TERM GOAL #4   Title Independent in HEP    Time 6    Period Weeks    Status New    Target Date 07/26/20      PT LONG TERM GOAL #5   Title Improve FOTO to </= 38% limitation    Time 6    Period Weeks    Status New    Target Date 07/26/20                 Plan - 06/26/20 1323    Clinical Impression Statement The patient has myofascial tightness t/o R forearm.  PT worked on [employer], Armed forces logistics/support/administrative officer, and updated home exercises.  PT to continue to progress.    Rehab Potential Good    PT Frequency 2x / week    PT Duration 6 weeks    PT Treatment/Interventions Patient/family education;ADLs/Self Care Home Management;Aquatic Therapy;Cryotherapy;Electrical Stimulation;Iontophoresis 4mg /ml Dexamethasone;Moist Heat;Ultrasound;Therapeutic activities;Therapeutic exercise;Neuromuscular re-education;Manual techniques;Dry needling;Taping    PT Next Visit Plan progress with ROM exercises Rt UE; progress nerve mobilization gently as tolerated; manual work through cervical musculature and Rt UE to decrease fascial restrictions; AROM Rt hand avoid  resistive exercises for hand strength until symptoms are improved    PT Home Exercise Plan [email or web address]    Consulted and Agree with Plan of Care Patient           Patient will benefit from skilled therapeutic intervention in order to improve the following deficits and impairments:     Visit Diagnosis: Pain of right upper extremity  Other symptoms and signs involving the musculoskeletal system  Muscle weakness (generalized)     Problem List Patient Active Problem List   Diagnosis Date Noted  . 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 Rotator cuff syndrome, right 02/04/2019  . Primary osteoarthritis of both first carpometacarpal joints 06/03/2017  . BPH (benign prostatic hyperplasia) 04/26/2016  . Arthritis of left knee 11/01/2013  . Arthritis of knee, right 09/08/2013  . Obstructive sleep apnea 07/14/2013  . Left Achilles tendonitis 04/23/2013  . Low back pain 04/23/2013  . Osteoarthritis of both knees 03/19/2013  . Coronary atherosclerosis 09/06/2011  . 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  . Status post coronary artery bypass grafting 08/27/2011  . Hyperlipidemia with target low density lipoprotein (LDL) cholesterol less than 70 mg/dL 01/10/2011  . OBESITY, UNSPECIFIED 01/09/2011  . WEIGHT GAIN 01/09/2011  . BACK STRAIN 10/02/2010  . ADJUSTMENT DISORDER WITH DEPRESSED MOOD 03/31/2009  . HYPERTENSION, BENIGN ESSENTIAL 11/21/2008  . VARICOSE VEINS LOWER EXTREMITIES W/OTH COMPS 08/31/2008  . IMPAIRED FASTING GLUCOSE 08/31/2008  . COLONIC POLYPS 04/20/2008  . GERD 03/16/2008    [place] , PT 06/26/2020, 1:25 PM  [facility] [place], [name], [address] Phone: [phone]   Fax:  [phone]  Name: GENIE MIRABAL MRN: [id number] Date of Birth: August 05, 1961

## 2020-06-26 NOTE — Patient Instructions (Signed)
Access Code: [email or web address] URL: [email or web address] Date: 06/26/2020 Prepared by: Rudell Cobb  Exercises Seated Gentle Upper Trapezius Stretch - 2 x daily - 7 x weekly - 1 sets - 3 reps - 20-30 seconds hold Standing Cervical Retraction - 2 x daily - 7 x weekly - 1 sets - 10 reps Doorway Pec Stretch at 90 Degrees Abduction - 2 x daily - 7 x weekly - 1 sets - 3 reps - 30 seconds hold Standing Radial Nerve Glide - 2 x daily - 7 x weekly - 1 sets - 3 reps - 30 sec hold Standing Bicep Stretch at [place] - 2 x daily - 7 x weekly - 1 sets - 10 reps Seated Isometric Wrist Radial Deviation with Manual Resistance - 2 x daily - 7 x weekly - 1 sets - 10 reps - 5 seconds hold Seated Isometric Wrist Extension - 2 x daily - 7 x weekly - 1 sets - 10 reps - 5 seconds hold

## 2020-06-28 ENCOUNTER — Ambulatory Visit (INDEPENDENT_AMBULATORY_CARE_PROVIDER_SITE_OTHER): Payer: BC Managed Care – PPO | Admitting: Rehabilitative and Restorative Service Providers"

## 2020-06-28 ENCOUNTER — Encounter: Payer: Self-pay | Admitting: Rehabilitative and Restorative Service Providers"

## 2020-06-28 ENCOUNTER — Other Ambulatory Visit: Payer: Self-pay

## 2020-06-28 DIAGNOSIS — G5630 Lesion of radial nerve, unspecified upper limb: Secondary | ICD-10-CM

## 2020-06-28 DIAGNOSIS — M6281 Muscle weakness (generalized): Secondary | ICD-10-CM | POA: Diagnosis not present

## 2020-06-28 DIAGNOSIS — M79601 Pain in right arm: Secondary | ICD-10-CM

## 2020-06-28 DIAGNOSIS — G589 Mononeuropathy, unspecified: Secondary | ICD-10-CM | POA: Diagnosis not present

## 2020-06-28 DIAGNOSIS — R29898 Other symptoms and signs involving the musculoskeletal system: Secondary | ICD-10-CM

## 2020-06-28 NOTE — Patient Instructions (Signed)
Access Code: [email or web address] URL: [email or web address] Date: 06/28/2020 Prepared by: Rudell Cobb  Exercises Seated Gentle Upper Trapezius Stretch - 2 x daily - 7 x weekly - 1 sets - 3 reps - 20-30 seconds hold Standing Cervical Retraction - 2 x daily - 7 x weekly - 1 sets - 10 reps Doorway Pec Stretch at 90 Degrees Abduction - 2 x daily - 7 x weekly - 1 sets - 3 reps - 30 seconds hold Standing Radial Nerve Glide - 2 x daily - 7 x weekly - 1 sets - 3 reps - 30 sec hold Standing Bicep Stretch at [place] - 2 x daily - 7 x weekly - 1 sets - 10 reps Seated Wrist Radial Deviation with Anchored Resistance - 2 x daily - 7 x weekly - 1 sets - 15 reps Wrist Extension with Resistance - 2 x daily - 7 x weekly - 1 sets - 15 reps Thumb Flexion with Resistance - 2 x daily - 7 x weekly - 1 sets - 15 reps

## 2020-06-28 NOTE — Therapy (Signed)
[place], [name], [address] Phone: [phone]   Fax:  [phone]  Physical Therapy Treatment  Patient Details  Name: Bobby White MRN: [id number] Date of Birth: [birth year] Referring Provider (PT): Dr Dianah Field   Encounter Date: 06/28/2020   PT End of Session - 06/28/20 0725    Visit Number 4    Number of Visits 12    Date for PT Re-Evaluation 07/26/20    PT Start Time 0720    PT Stop Time 0809    PT Time Calculation (min) 49 min    Activity Tolerance Patient tolerated treatment well;No increased pain    Behavior During Therapy WFL for tasks assessed/performed           Past Medical History:  Diagnosis Date  . Arthritis   . Coronary artery disease   . GERD (gastroesophageal reflux disease)   . Hepatitis    Hep C --had to get shots  . Hypertension 1/10  . Impaired fasting glucose 2009  . Obesity   . Sleep apnea   . Varicose vein     Past Surgical History:  Procedure Laterality Date  . CARDIAC CATHETERIZATION     2012  . CORONARY ARTERY BYPASS GRAFT  07/25/11   3 vessel  . pins in right hand  1990's   4th and 5th digits-- removed 6 weeks afterwards  . TOTAL KNEE ARTHROPLASTY Right 09/06/2013   Dr Mayer Camel  . TOTAL KNEE ARTHROPLASTY Right 09/06/2013   Procedure: TOTAL KNEE ARTHROPLASTY;  Surgeon: Kerin Salen, MD;  Location: [facility];  Service: Orthopedics;  Laterality: Right;  . TOTAL KNEE ARTHROPLASTY Left 11/01/2013   DR Mayer Camel  . TOTAL KNEE ARTHROPLASTY Left 11/01/2013   Procedure: TOTAL KNEE ARTHROPLASTY- LEFT;  Surgeon: Kerin Salen, MD;  Location: [facility];  Service: Orthopedics;  Laterality: Left;    There were no vitals filed for this visit.   Subjective Assessment - 06/28/20 0726    Subjective The patient reports no pain today.  He notes he had some soreness after last session.    Pertinent History bilat TKA 2015; 4th and 5th metacarpal fx/ORIF 1992; Fx Lt thumb  1987; OHS - CABPG 2012; hormone replacement    Patient Stated Goals get rid of Rt UE pain and get back to work    Currently in Pain? No/denies              [facility] PT Assessment - 06/28/20 0735      Assessment   Medical Diagnosis Wartenberg Syndrome    Referring Provider (PT) Dr Dianah Field    Onset Date/Surgical Date 04/18/20    Hand Dominance Right    Next MD Visit 07/07/20      AROM   Right Wrist Extension 70 Degrees                         [facility] Adult PT Treatment/Exercise - 06/28/20 0731      Exercises   Exercises Hand;Wrist;Elbow;Shoulder      Elbow Exercises   Elbow Flexion Strengthening;Right;15 reps    Bar Weights/Barbell (Elbow Flexion) 3 lbs    Elbow Extension Strengthening;Right;15 reps    Bar Weights/Barbell (Elbow Extension) 3 lbs    Forearm Supination Strengthening;Right;15 reps    Bar Weights/Barbell (Forearm Supination) 3 lbs    Forearm Pronation Strengthening;Right;15 reps    Bar Weights/Barbell (Forearm Pronation) 3 lbs      Shoulder Exercises: ROM/Strengthening  UBE (Upper Arm Bike) L3 x 2.5 minutes forward, 1 back      Shoulder Exercises: Stretch   Other Shoulder Stretches UE nerve glides      Hand Exercises   Other Hand Exercises welding clamp x 15 reps with grip strength      Wrist Exercises   Wrist Flexion Strengthening;Right;15 reps    Wrist Flexion Limitations with 3lb weight, the patient has ulnar deviation with movement therefore reduced resistance    Wrist Extension Strengthening;Right;15 reps    Bar Weights/Barbell (Wrist Extension) 3 lbs    Wrist Radial Deviation Strengthening;Right;15 reps    Theraband Level (Radial Deviation) Level 2 (Red)    Other wrist exercises stretching wrist extensors seated      Manual Therapy   Manual Therapy Soft tissue mobilization;Joint mobilization    Manual therapy comments to reduce myofascial, TrPs tightness in R UE    Joint Mobilization radioulnar mobilization grade II    Soft  tissue mobilization STM to wrist extensors, pronator, wrist flexors                  PT Education - 06/28/20 0807    Education Details updated HEP    Person(s) Educated Patient    Methods Explanation;Demonstration;Handout    Comprehension Returned demonstration;Verbalized understanding               PT Long Term Goals - 06/28/20 0817      PT LONG TERM GOAL #1   Title Increase AROM Rt UE to equal AROM Lt with no pain    Baseline wrist extension is 70 degrees AROM R UE    Time 6    Period Weeks    Status Achieved      PT LONG TERM GOAL #2   Title Increase Rt grip strength to 50-60 pounds and pinch to 8-10 pounds    Baseline 60 lbs R grip and 9 lbs pincer grasp    Time 6    Period Weeks    Status Achieved      PT LONG TERM GOAL #3   Title Patient reports return to all normal functional activities with minimal to no pain Rt UE    Time 6    Period Weeks    Status New      PT LONG TERM GOAL #4   Title Independent in HEP    Time 6    Period Weeks    Status New      PT LONG TERM GOAL #5   Title Improve FOTO to </= 38% limitation    Time 6    Period Weeks    Status New                 Plan - 06/28/20 0818    Clinical Impression Statement The patient has met 2 LTGs. Pain has improved and strength is returning.  He improved grip from 5 up to 60 lbs and pincer grasp from 0 up to 9 lbs.  PT to continue progressing strengthening and stretching within tolerance.    Rehab Potential Good    PT Frequency 2x / week    PT Duration 6 weeks    PT Treatment/Interventions Patient/family education;ADLs/Self Care Home Management;Aquatic Therapy;Cryotherapy;Electrical Stimulation;Iontophoresis 23m/ml Dexamethasone;Moist Heat;Ultrasound;Therapeutic activities;Therapeutic exercise;Neuromuscular re-education;Manual techniques;Dry needling;Taping    PT Next Visit Plan progress with ROM exercises Rt UE; progress nerve mobilization gently as tolerated; manual work through  cervical musculature and Rt UE to decrease fascial restrictions; AROM Rt hand avoid  resistive exercises for hand strength until symptoms are improved    PT Home Exercise Plan [email or web address]    Consulted and Agree with Plan of Care Patient           Patient will benefit from skilled therapeutic intervention in order to improve the following deficits and impairments:     Visit Diagnosis: Pain of right upper extremity  Other symptoms and signs involving the musculoskeletal system  Muscle weakness (generalized)  Wartenberg syndrome     Problem List Patient Active Problem List   Diagnosis Date Noted  . 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 Rotator cuff syndrome, right 02/04/2019  . Primary osteoarthritis of both first carpometacarpal joints 06/03/2017  . BPH (benign prostatic hyperplasia) 04/26/2016  . Arthritis of left knee 11/01/2013  . Arthritis of knee, right 09/08/2013  . Obstructive sleep apnea 07/14/2013  . Left Achilles tendonitis 04/23/2013  . Low back pain 04/23/2013  . Osteoarthritis of both knees 03/19/2013  . Coronary atherosclerosis 09/06/2011  . 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  . Status post coronary artery bypass grafting 08/27/2011  . Hyperlipidemia with target low density lipoprotein (LDL) cholesterol less than 70 mg/dL 01/10/2011  . OBESITY, UNSPECIFIED 01/09/2011  . WEIGHT GAIN 01/09/2011  . BACK STRAIN 10/02/2010  . ADJUSTMENT DISORDER WITH DEPRESSED MOOD 03/31/2009  . HYPERTENSION, BENIGN ESSENTIAL 11/21/2008  . VARICOSE VEINS LOWER EXTREMITIES W/OTH COMPS 08/31/2008  . IMPAIRED FASTING GLUCOSE 08/31/2008  . COLONIC POLYPS 04/20/2008  . GERD 03/16/2008    [place], PT 06/28/2020, 8:19 AM  [facility] [place], [name], [address] Phone: [phone]   Fax:  [phone]  Name: Bobby White MRN: [id number] Date of Birth: 1961-06-28

## 2020-07-03 ENCOUNTER — Other Ambulatory Visit: Payer: Self-pay

## 2020-07-03 ENCOUNTER — Encounter: Payer: Self-pay | Admitting: Rehabilitative and Restorative Service Providers"

## 2020-07-03 ENCOUNTER — Ambulatory Visit (INDEPENDENT_AMBULATORY_CARE_PROVIDER_SITE_OTHER): Payer: BC Managed Care – PPO | Admitting: Rehabilitative and Restorative Service Providers"

## 2020-07-03 DIAGNOSIS — R29898 Other symptoms and signs involving the musculoskeletal system: Secondary | ICD-10-CM

## 2020-07-03 DIAGNOSIS — M79601 Pain in right arm: Secondary | ICD-10-CM

## 2020-07-03 DIAGNOSIS — M6281 Muscle weakness (generalized): Secondary | ICD-10-CM

## 2020-07-03 NOTE — Patient Instructions (Signed)
Access Code: [email or web address] URL: [email or web address] Date: 07/03/2020 Prepared by: Rudell Cobb  Exercises Seated Gentle Upper Trapezius Stretch - 2 x daily - 7 x weekly - 1 sets - 3 reps - 20-30 seconds hold Standing Cervical Retraction - 2 x daily - 7 x weekly - 1 sets - 10 reps Doorway Pec Stretch at 90 Degrees Abduction - 2 x daily - 7 x weekly - 1 sets - 3 reps - 30 seconds hold Standing Radial Nerve Glide - 2 x daily - 7 x weekly - 1 sets - 3 reps - 30 sec hold Standing Bicep Stretch at [place] - 2 x daily - 7 x weekly - 1 sets - 10 reps Seated Wrist Radial Deviation with Anchored Resistance - 2 x daily - 7 x weekly - 1 sets - 15 reps Wrist Extension with Resistance - 2 x daily - 7 x weekly - 1 sets - 15 reps Thumb Flexion with Resistance - 2 x daily - 7 x weekly - 1 sets - 15 reps Standing Wrist Flexion Stretch - 2 x daily - 7 x weekly - 1 sets - 3 reps - 20 seconds hold

## 2020-07-03 NOTE — Therapy (Signed)
[place], [name], [address] Phone: [phone]   Fax:  [phone]  Physical Therapy Treatment  Patient Details  Name: TALHA ISER MRN: [id number] Date of Birth: [birth year] Referring Provider (PT): Dr Dianah Field   Encounter Date: 07/03/2020   PT End of Session - 07/03/20 0726    Visit Number 5    Number of Visits 12    Date for PT Re-Evaluation 07/26/20    PT Start Time 0720    PT Stop Time 0800    PT Time Calculation (min) 40 min    Activity Tolerance Patient tolerated treatment well;No increased pain    Behavior During Therapy WFL for tasks assessed/performed           Past Medical History:  Diagnosis Date  . Arthritis   . Coronary artery disease   . GERD (gastroesophageal reflux disease)   . Hepatitis    Hep C --had to get shots  . Hypertension 1/10  . Impaired fasting glucose 2009  . Obesity   . Sleep apnea   . Varicose vein     Past Surgical History:  Procedure Laterality Date  . CARDIAC CATHETERIZATION     2012  . CORONARY ARTERY BYPASS GRAFT  07/25/11   3 vessel  . pins in right hand  1990's   4th and 5th digits-- removed 6 weeks afterwards  . TOTAL KNEE ARTHROPLASTY Right 09/06/2013   Dr Mayer Camel  . TOTAL KNEE ARTHROPLASTY Right 09/06/2013   Procedure: TOTAL KNEE ARTHROPLASTY;  Surgeon: Kerin Salen, MD;  Location: [place];  Service: Orthopedics;  Laterality: Right;  . TOTAL KNEE ARTHROPLASTY Left 11/01/2013   DR Mayer Camel  . TOTAL KNEE ARTHROPLASTY Left 11/01/2013   Procedure: TOTAL KNEE ARTHROPLASTY- LEFT;  Surgeon: Kerin Salen, MD;  Location: [place];  Service: Orthopedics;  Laterality: Left;    There were no vitals filed for this visit.   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  He is not having any arm pain and feels strength has improved.    Pertinent History bilat TKA  2015; 4th and 5th metacarpal fx/ORIF 1992; Fx Lt thumb 1987; [facility] - CABPG 2012; hormone replacement    Patient Stated Goals get rid of Rt UE pain and get back to work    Currently in Pain? No/denies              [facility] PT Assessment - 07/03/20 0001      Assessment   Medical Diagnosis Wartenberg Syndrome    Referring Provider (PT) Dr Dianah Field    Onset Date/Surgical Date 04/18/20    Hand Dominance Right    Next MD Visit 07/07/20      Strength   Right Hand Grip (lbs) 108    Right Hand Lateral Pinch 11 lbs    Left Hand Grip (lbs) 90    Left Hand Lateral Pinch 10.5 lbs                         [facility] Adult PT Treatment/Exercise - 07/03/20 0728      Therapeutic Activites    Therapeutic Activities Other Therapeutic Activities    Other Therapeutic Activities Performed hammering tasks with small hammer for job simulation activities.  Tightening and loosening bolts for fine motor training working on speed.  No pain noted with hammer use  Exercises   Exercises Shoulder;Elbow;Wrist;Hand      Elbow Exercises   Elbow Flexion Strengthening;Right    Bar Weights/Barbell (Elbow Flexion) Other (comment)   8 lbs   Forearm Supination Strengthening    Forearm Supination Limitations using a hammer wiht elbow bent and elbow straight    Forearm Pronation Strengthening    Forearm Pronation Limitations using a hammer wiht elbow bent and elbow straight      Neck Exercises: Theraband   Horizontal ABduction Green;10 reps   supine     Shoulder Exercises: Supine   Diagonals Strengthening;Right;Left;10 reps    Theraband Level (Shoulder Diagonals) Level 3 (Green)    Other Supine Exercises Green t-band retraction/horizontal abduction x 15 reps      Shoulder Exercises: Standing   Retraction Strengthening;Right;Left;10 reps    Theraband Level (Shoulder Retraction) Level 3 (Green)      Shoulder Exercises: ROM/Strengthening   UBE (Upper Arm Bike) L4 2 minutes forward/2 minutes  backward      Shoulder Exercises: Stretch   Other Shoulder Stretches anterior shoulder stretch, latissimus stretch standing      Wrist Exercises   Wrist Extension AROM;AAROM;Right    Wrist Extension Limitations stretching wrist extensors in seated x 4 reps x 30 second holds    Wrist Radial Deviation Strengthening;Right;15 reps    Theraband Level (Radial Deviation) Level 3 (Green)    Wrist Ulnar Deviation Strengthening;Right;15 reps    Theraband Level (Ulnar Deviation) Level 3 (Green)    Other wrist exercises nerve glides ulnar and median nerve in standing                  PT Education - 07/03/20 1221    Education Details added wrist extensor stretch to HEP    Person(s) Educated Patient    Methods Explanation;Demonstration    Comprehension Returned demonstration;Verbalized understanding               PT Long Term Goals - 07/03/20 0751      PT LONG TERM GOAL #1   Title Increase AROM Rt UE to equal AROM Lt with no pain    Baseline wrist extension is 70 degrees AROM R UE    Time 6    Period Weeks    Status Achieved      PT LONG TERM GOAL #2   Title Increase Rt grip strength to 50-60 pounds and pinch to 8-10 pounds    Baseline 108 lbs R grip and 11 lbs pincer grasp    Time 6    Period Weeks    Status Achieved      PT LONG TERM GOAL #3   Title Patient reports return to all normal functional activities with minimal to no pain Rt UE    Time 6    Period Weeks    Status Partially Met      PT LONG TERM GOAL #4   Title Independent in HEP    Time 6    Period Weeks    Status On-going      PT LONG TERM GOAL #5   Title Improve FOTO to </= 38% limitation    Baseline 41% limitation per FOTO; notes difficulty with carrying heavy items and opening jars    Time 6    Period Weeks    Status On-going                 Plan - 07/03/20 1223    Clinical Impression Statement The patient is making progress with  grip strength 48 pounds higher today than last week!   He does note continued deficits with opening a jar, carrying heavy items.  PT continuing to progress strengthening emphasizing stability in wrist extensors.    Rehab Potential Good    PT Frequency 2x / week    PT Duration 6 weeks    PT Treatment/Interventions Patient/family education;ADLs/Self Care Home Management;Aquatic Therapy;Cryotherapy;Electrical Stimulation;Iontophoresis 31m/ml Dexamethasone;Moist Heat;Ultrasound;Therapeutic activities;Therapeutic exercise;Neuromuscular re-education;Manual techniques;Dry needling;Taping    PT Next Visit Plan progress with ROM exercises Rt UE; progress nerve mobilization gently as tolerated; manual work through cervical musculature and Rt UE to decrease fascial restrictions; AROM Rt hand avoid resistive exercises for hand strength until symptoms are improved    PT Home Exercise Plan [id number]    Consulted and Agree with Plan of Care Patient           Patient will benefit from skilled therapeutic intervention in order to improve the following deficits and impairments:     Visit Diagnosis: Pain of right upper extremity  Other symptoms and signs involving the musculoskeletal system  Muscle weakness (generalized)     Problem List Patient Active Problem List   Diagnosis Date Noted  . 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 Rotator cuff syndrome, right 02/04/2019  . Primary osteoarthritis of both first carpometacarpal joints 06/03/2017  . BPH (benign prostatic hyperplasia) 04/26/2016  . Arthritis of left knee 11/01/2013  . Arthritis of knee, right 09/08/2013  . Obstructive sleep apnea 07/14/2013  . Left Achilles tendonitis 04/23/2013  . Low back pain 04/23/2013  . Osteoarthritis of both knees 03/19/2013  . Coronary atherosclerosis 09/06/2011  . 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  . Status post coronary artery bypass grafting 08/27/2011  . Hyperlipidemia with target low density lipoprotein (LDL) cholesterol less than 70 mg/dL  01/10/2011  . OBESITY, UNSPECIFIED 01/09/2011  . WEIGHT GAIN 01/09/2011  . BACK STRAIN 10/02/2010  . ADJUSTMENT DISORDER WITH DEPRESSED MOOD 03/31/2009  . HYPERTENSION, BENIGN ESSENTIAL 11/21/2008  . VARICOSE VEINS LOWER EXTREMITIES W/OTH COMPS 08/31/2008  . IMPAIRED FASTING GLUCOSE 08/31/2008  . COLONIC POLYPS 04/20/2008  . GERD 03/16/2008    WVenice PT 07/03/2020, 12:24 PM  [facility] [address]Phone: [phone]  Fax:  [phone] Name: VIZZAC ROCKETTMRN: [id number] of Birth: [birth year]

## 2020-07-05 ENCOUNTER — Ambulatory Visit (INDEPENDENT_AMBULATORY_CARE_PROVIDER_SITE_OTHER): Payer: BC Managed Care – PPO | Admitting: Rehabilitative and Restorative Service Providers"

## 2020-07-05 ENCOUNTER — Other Ambulatory Visit: Payer: Self-pay

## 2020-07-05 DIAGNOSIS — M6281 Muscle weakness (generalized): Secondary | ICD-10-CM | POA: Diagnosis not present

## 2020-07-05 DIAGNOSIS — M79601 Pain in right arm: Secondary | ICD-10-CM

## 2020-07-05 DIAGNOSIS — R29898 Other symptoms and signs involving the musculoskeletal system: Secondary | ICD-10-CM | POA: Diagnosis not present

## 2020-07-05 NOTE — Therapy (Addendum)
[place], [name], [address] Phone: [phone]   Fax:  [phone]  Physical Therapy Treatment and Discharge Summary  Patient Details  Name: Bobby White MRN: [id number] Date of Birth: [birth year] Referring Provider (PT): Dr Dianah Field   Encounter Date: 07/05/2020   PT End of Session - 07/05/20 0719    Visit Number 6    Number of Visits 12    Date for PT Re-Evaluation 07/26/20    PT Start Time 0716    PT Stop Time 0758    PT Time Calculation (min) 42 min    Activity Tolerance Patient tolerated treatment well;No increased pain    Behavior During Therapy WFL for tasks assessed/performed           Past Medical History:  Diagnosis Date  . Arthritis   . Coronary artery disease   . GERD (gastroesophageal reflux disease)   . Hepatitis    Hep C --had to get shots  . Hypertension 1/10  . Impaired fasting glucose 2009  . Obesity   . Sleep apnea   . Varicose vein     Past Surgical History:  Procedure Laterality Date  . CARDIAC CATHETERIZATION     2012  . CORONARY ARTERY BYPASS GRAFT  07/25/11   3 vessel  . pins in right hand  1990's   4th and 5th digits-- removed 6 weeks afterwards  . TOTAL KNEE ARTHROPLASTY Right 09/06/2013   Dr Mayer Camel  . TOTAL KNEE ARTHROPLASTY Right 09/06/2013   Procedure: TOTAL KNEE ARTHROPLASTY;  Surgeon: Kerin Salen, MD;  Location: [facility];  Service: Orthopedics;  Laterality: Right;  . TOTAL KNEE ARTHROPLASTY Left 11/01/2013   DR Mayer Camel  . TOTAL KNEE ARTHROPLASTY Left 11/01/2013   Procedure: TOTAL KNEE ARTHROPLASTY- LEFT;  Surgeon: Kerin Salen, MD;  Location: [facility];  Service: Orthopedics;  Laterality: Left;    There were no vitals filed for this visit.   Subjective Assessment - 07/05/20 0736    Subjective The patient reports he gets tired in the forearm with repetitive activities.    Pertinent History bilat TKA 2015; 4th and 5th metacarpal fx/ORIF 1992; Fx  Lt thumb 1987; O[facility] - CABPG 2012; hormone replacement    Patient Stated Goals get rid of Rt UE pain and get back to work    Currently in Pain? No/denies              [facility] PT Assessment - 07/05/20 1003      Assessment   Medical Diagnosis Wartenberg Syndrome    Referring Provider (PT) Dr Dianah Field    Onset Date/Surgical Date 04/18/20                         [facility] Adult PT Treatment/Exercise - 07/05/20 0723      Exercises   Exercises Shoulder;Elbow;Wrist;Hand      Elbow Exercises   Elbow Flexion Strengthening;Right;15 reps    Bar Weights/Barbell (Elbow Flexion) 5 lbs    Elbow Extension Strengthening;Right;15 reps    Bar Weights/Barbell (Elbow Extension) 5 lbs    Forearm Supination Strengthening;Right;15 reps    Bar Weights/Barbell (Forearm Supination) 3 lbs    Forearm Pronation Strengthening;Right;15 reps    Bar Weights/Barbell (Forearm Pronation) 3 lbs      Shoulder Exercises: Supine   Horizontal ABduction Strengthening;15 reps;Both    Theraband Level (Shoulder Horizontal ABduction) Level 2 (Red)    Diagonals Strengthening;Right;Left;10 reps    Theraband  Level (Shoulder Diagonals) Level 3 (Green)      Shoulder Exercises: ROM/Strengthening   UBE (Upper Arm Bike) L4 x 2 minutes forward/ 1 minute backward      Shoulder Exercises: Stretch   Corner Stretch 2 reps;30 seconds    Corner Stretch Limitations doorway stretch    Other Shoulder Stretches biceps stretch at wall and at table      Wrist Exercises   Wrist Flexion AAROM;Right    Bar Weights/Barbell (Wrist Flexion) 3 lbs   also did 15 reps seated wrist flexion   Wrist Flexion Limitations stretching    Wrist Extension AAROM;Right    Bar Weights/Barbell (Wrist Extension) 3 lbs   also did 15 reps strengthening   Wrist Extension Limitations stretching    Wrist Radial Deviation Strengthening;Right;15 reps    Bar Weights/Barbell (Radial Deviation) 3 lbs    Wrist Ulnar Deviation Strengthening;Right;15  reps    Bar Weights/Barbell (Ulnar Deviation) 3 lbs    Other wrist exercises nerve glides ulnar and radial nerve    Other wrist exercises pronation/supination standing with shoulder flexed to 90 with 3 lbs x 15 reps;  Velcro board:  long handle rolling sup/pronation x 3 reps along board, 2 finger pincer grasp rolling along velcro board and then 3 finger grasp for rolling x 1 reps down/back on board,                        PT Long Term Goals - 07/05/20 1003      PT LONG TERM GOAL #1   Title Increase AROM Rt UE to equal AROM Lt with no pain    Baseline wrist extension is 70 degrees AROM R UE    Time 6    Period Weeks    Status Achieved      PT LONG TERM GOAL #2   Title Increase Rt grip strength to 50-60 pounds and pinch to 8-10 pounds    Baseline 108 lbs R grip and 11 lbs pincer grasp    Time 6    Period Weeks    Status Achieved      PT LONG TERM GOAL #3   Title Patient reports return to all normal functional activities with minimal to no pain Rt UE    Baseline notes fatigue in R arm t/o the day and minimal pain; using for daily work activities with some limitation    Time 6    Period Weeks    Status Partially Met      PT LONG TERM GOAL #4   Title Independent in HEP    Baseline patient has HEP, but does not perform regularly due to time limitation with work    Time 6    Period Weeks    Status Partially Met      PT LONG TERM GOAL #5   Title Improve FOTO to </= 38% limitation    Baseline 41% limitation per FOTO; notes difficulty with carrying heavy items and opening jars    Time 6    Period Weeks    Status Partially Met                 Plan - 07/05/20 0749    Clinical Impression Statement The patient is tolerating increased activiity with strengthening today.  Some activities are limited due to R shoulder pain and R first MCP pain.  PT and patient discussed working on stretching t/o the day for wrist flexors/extensors. He has returned to using  impact gun  at work (it weights 10 lbs and has high force) and is stabilizing with his elbow against his thigh.  He gets fatigue and mild pain with repetitive strengthening in the clinic. Pain is located in extensor musculature.    Rehab Potential Good    PT Frequency 2x / week    PT Duration 6 weeks    PT Treatment/Interventions Patient/family education;ADLs/Self Care Home Management;Aquatic Therapy;Cryotherapy;Electrical Stimulation;Iontophoresis 7m/ml Dexamethasone;Moist Heat;Ultrasound;Therapeutic activities;Therapeutic exercise;Neuromuscular re-education;Manual techniques;Dry needling;Taping    PT Next Visit Plan patient to return to MD-- will contact clinic if wants to work on further strengthening; continue endurance/progressive resistive training and job specific tasks.    PT Home Exercise Plan [id number]    Consulted and Agree with Plan of Care Patient           Patient will benefit from skilled therapeutic intervention in order to improve the following deficits and impairments:     Visit Diagnosis: Pain of right upper extremity  Other symptoms and signs involving the musculoskeletal system  Muscle weakness (generalized)     Problem List Patient Active Problem List   Diagnosis Date Noted  . 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 Rotator cuff syndrome, right 02/04/2019  . Primary osteoarthritis of both first carpometacarpal joints 06/03/2017  . BPH (benign prostatic hyperplasia) 04/26/2016  . Arthritis of left knee 11/01/2013  . Arthritis of knee, right 09/08/2013  . Obstructive sleep apnea 07/14/2013  . Left Achilles tendonitis 04/23/2013  . Low back pain 04/23/2013  . Osteoarthritis of both knees 03/19/2013  . Coronary atherosclerosis 09/06/2011  . 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  . Status post coronary artery bypass grafting 08/27/2011  . Hyperlipidemia with target low density lipoprotein (LDL) cholesterol less than 70 mg/dL 01/10/2011  . OBESITY,  UNSPECIFIED 01/09/2011  . WEIGHT GAIN 01/09/2011  . BACK STRAIN 10/02/2010  . ADJUSTMENT DISORDER WITH DEPRESSED MOOD 03/31/2009  . HYPERTENSION, BENIGN ESSENTIAL 11/21/2008  . VARICOSE VEINS LOWER EXTREMITIES W/OTH COMPS 08/31/2008  . IMPAIRED FASTING GLUCOSE 08/31/2008  . COLONIC POLYPS 04/20/2008  . GERD 03/16/2008   PHYSICAL THERAPY DISCHARGE SUMMARY  Visits from Start of Care: 6  Current functional level related to goals / functional outcomes: See goals above-- partially met   Remaining deficits: See goals above   Education / Equipment: Home program  Plan: Patient agrees to discharge.  Patient goals were partially met. Patient is being discharged due to meeting the stated rehab goals.  ?????        Thank you for the referral of this patient. CRudell Cobb MPT  WLuling PMesa8/18/2021, 10:06 AM  [facility] [address]Phone: [phone]  Fax:  [phone] Name: Bobby LAUGHTERMRN: [id number] of Birth: [birth year]

## 2020-07-07 ENCOUNTER — Ambulatory Visit: Payer: BC Managed Care – PPO | Admitting: Sports Medicine

## 2020-07-11 ENCOUNTER — Ambulatory Visit (INDEPENDENT_AMBULATORY_CARE_PROVIDER_SITE_OTHER): Payer: BC Managed Care – PPO | Admitting: Sports Medicine

## 2020-07-11 DIAGNOSIS — G589 Mononeuropathy, unspecified: Secondary | ICD-10-CM | POA: Diagnosis not present

## 2020-07-11 DIAGNOSIS — G5630 Lesion of radial nerve, unspecified upper limb: Secondary | ICD-10-CM

## 2020-07-11 NOTE — Assessment & Plan Note (Signed)
This is a pleasant 59 year old male, approximately 2-1/2 months ago he was working on his car, he impacted his dorsal forearm, he had immediate pain, as well as tingling over the dorsum of his hand with weakness. We treated him with prednisone, vitamin B6, wrist brace, physical therapy. He has improved drastically. He continues to have some pain particular with supination and extension of his wrist, he has a bit of pain over the dorsal mid forearm. 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 We will be looking for a discrete area of radial nerve versus posterior interosseous nerve swelling to serve as a target for hydrodissection. Certainly a nerve conduction study would be beneficial as well if the above work-up is unrevealing. Return in a month.

## 2020-07-11 NOTE — Progress Notes (Signed)
    Procedures performed today:    None.  Independent interpretation of notes and tests performed by another provider:   None.  Brief History, Exam, Impression, and Recommendations:    Wartenberg syndrome, right This is a pleasant 59 year old male, approximately 2-1/2 months ago he was working on his car, he impacted his dorsal forearm, he had immediate pain, as well as tingling over the dorsum of his hand with weakness. We treated him with prednisone, vitamin B6, wrist brace, physical therapy. He has improved drastically. He continues to have some pain particular with supination and extension of his wrist, he has a bit of pain over the dorsal mid forearm. 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 We will be looking for a discrete area of radial nerve versus posterior interosseous nerve swelling to serve as a target for hydrodissection. Certainly a nerve conduction study would be beneficial as well if the above work-up is unrevealing. Return in a month.    ___________________________________________ Gwen Her. Dianah Field, M.D., ABFM., CAQSM. Primary Care and [place] Instructor of [place] of [facility] of [facility]

## 2020-08-08 ENCOUNTER — Ambulatory Visit (INDEPENDENT_AMBULATORY_CARE_PROVIDER_SITE_OTHER): Payer: BC Managed Care – PPO | Admitting: Sports Medicine

## 2020-08-08 ENCOUNTER — Other Ambulatory Visit: Payer: Self-pay

## 2020-08-08 DIAGNOSIS — G589 Mononeuropathy, unspecified: Secondary | ICD-10-CM | POA: Diagnosis not present

## 2020-08-08 DIAGNOSIS — G5630 Lesion of radial nerve, unspecified upper limb: Secondary | ICD-10-CM

## 2020-08-08 NOTE — Assessment & Plan Note (Signed)
This is a very pleasant 59 year old male, he is now about 3-1/2 months post injury to his right forearm, he had an impaction injury that resulted in pain and weakness over his dorsal wrist, particularly with dorsiflexion. We suspected Wartenberg syndrome, we placed him in a brace, added prednisone, vitamin B6, physical therapy, he returns today essentially symptom-free. He can return to see me on an as-needed basis. If there is any recurrence of symptoms we will likely get an MRI looking for a discrete area of radial nerve versus posterior interosseous nerve swelling to serve as a target for Hydro dissection.

## 2020-08-08 NOTE — Progress Notes (Signed)
    Procedures performed today:    None.  Independent interpretation of notes and tests performed by another provider:   None.  Brief History, Exam, Impression, and Recommendations:    Wartenberg syndrome, right This is a very pleasant 59 year old male, he is now about 3-1/2 months post injury to his right forearm, he had an impaction injury that resulted in pain and weakness over his dorsal wrist, particularly with dorsiflexion. We suspected Wartenberg syndrome, we placed him in a brace, added prednisone, vitamin B6, physical therapy, he returns today essentially symptom-free. He can return to see me on an as-needed basis. If there is any recurrence of symptoms we will likely get an MRI looking for a discrete area of radial nerve versus posterior interosseous nerve swelling to serve as a target for Hydro dissection.    ___________________________________________ Gwen Her. Dianah Field, M.D., ABFM., CAQSM. Primary Care and [place] Instructor of [place] of [facility] of Medicine

## 2020-08-30 ENCOUNTER — Telehealth: Payer: Self-pay

## 2020-08-30 MED ORDER — AMOXICILLIN 500 MG PO CAPS
2000.0000 mg | ORAL_CAPSULE | Freq: Once | ORAL | 0 refills | Status: DC
Start: 2020-08-30 — End: 2021-09-19

## 2020-08-30 NOTE — Telephone Encounter (Signed)
rx sent to [facility]

## 2020-08-30 NOTE — Telephone Encounter (Signed)
Bobby White needs pre-medication for his dentist appointment. He states he absolutely needs to be pre-medicated. He states he has had a knee replacement and the ortho states he will always need to be pre-medicated. He has an appointment tomorrow.

## 2020-08-30 NOTE — Telephone Encounter (Signed)
Patient advised.

## 2020-09-21 DIAGNOSIS — I2510 Atherosclerotic heart disease of native coronary artery without angina pectoris: Secondary | ICD-10-CM | POA: Diagnosis not present

## 2020-09-21 DIAGNOSIS — E7800 Pure hypercholesterolemia, unspecified: Secondary | ICD-10-CM | POA: Diagnosis not present

## 2020-09-21 DIAGNOSIS — I10 Essential (primary) hypertension: Secondary | ICD-10-CM | POA: Diagnosis not present

## 2020-10-03 DIAGNOSIS — R5383 Other fatigue: Secondary | ICD-10-CM | POA: Diagnosis not present

## 2020-10-03 DIAGNOSIS — E291 Testicular hypofunction: Secondary | ICD-10-CM | POA: Diagnosis not present

## 2020-10-07 DIAGNOSIS — Z20822 Contact with and (suspected) exposure to covid-19: Secondary | ICD-10-CM | POA: Diagnosis not present

## 2020-10-16 DIAGNOSIS — Z03818 Encounter for observation for suspected exposure to other biological agents ruled out: Secondary | ICD-10-CM | POA: Diagnosis not present

## 2020-10-19 ENCOUNTER — Ambulatory Visit (INDEPENDENT_AMBULATORY_CARE_PROVIDER_SITE_OTHER): Payer: BC Managed Care – PPO | Admitting: Sports Medicine

## 2020-10-19 ENCOUNTER — Ambulatory Visit (INDEPENDENT_AMBULATORY_CARE_PROVIDER_SITE_OTHER): Payer: BC Managed Care – PPO

## 2020-10-19 ENCOUNTER — Other Ambulatory Visit: Payer: Self-pay

## 2020-10-19 DIAGNOSIS — M75101 Unspecified rotator cuff tear or rupture of right shoulder, not specified as traumatic: Secondary | ICD-10-CM

## 2020-10-19 DIAGNOSIS — S50852A Superficial foreign body of left forearm, initial encounter: Secondary | ICD-10-CM | POA: Diagnosis not present

## 2020-10-19 DIAGNOSIS — G5630 Lesion of radial nerve, unspecified upper limb: Secondary | ICD-10-CM

## 2020-10-19 DIAGNOSIS — M25511 Pain in right shoulder: Secondary | ICD-10-CM | POA: Diagnosis not present

## 2020-10-19 MED ORDER — AMITRIPTYLINE HCL 75 MG PO TABS: 75.0000 mg | ORAL_TABLET | Freq: Every day | ORAL | 3 refills | Status: DC

## 2020-10-19 NOTE — Progress Notes (Signed)
    Procedures performed today:    None.  Independent interpretation of notes and tests performed by another provider:   None.  Brief History, Exam, Impression, and Recommendations:    Rotator cuff syndrome, right I think Bobby White has completely torn his rotator cuff, he had a fall, and now has great difficulty with abduction of his right arm, weakness concerning for supraspinatus tear, passive range of motion is okay indicating this is not adhesive capsulitis. X-rays today, MRI for surgical planning. 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 He declines any medications other than his amitriptyline but would like to go up a little bit on the dose.    ___________________________________________ Gwen Her. Dianah Field, M.D., ABFM., CAQSM. Primary Care and [place] Instructor of [place] of [facility] of Medicine

## 2020-10-19 NOTE — Assessment & Plan Note (Signed)
I think Kacy has completely torn his rotator cuff, he had a fall, and now has great difficulty with abduction of his right arm, weakness concerning for supraspinatus tear, passive range of motion is okay indicating this is not adhesive capsulitis. X-rays today, MRI for surgical planning. 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 He declines any medications other than his amitriptyline but would like to go up a little bit on the dose.

## 2020-10-20 DIAGNOSIS — I2510 Atherosclerotic heart disease of native coronary artery without angina pectoris: Secondary | ICD-10-CM | POA: Diagnosis not present

## 2020-10-22 ENCOUNTER — Ambulatory Visit (INDEPENDENT_AMBULATORY_CARE_PROVIDER_SITE_OTHER): Payer: BC Managed Care – PPO

## 2020-10-22 ENCOUNTER — Other Ambulatory Visit: Payer: Self-pay

## 2020-10-22 DIAGNOSIS — M25511 Pain in right shoulder: Secondary | ICD-10-CM | POA: Diagnosis not present

## 2020-10-22 DIAGNOSIS — M75121 Complete rotator cuff tear or rupture of right shoulder, not specified as traumatic: Secondary | ICD-10-CM

## 2020-10-23 NOTE — Addendum Note (Signed)
Addended by: Silverio Decamp on: 10/23/2020 11:43 AM   Modules accepted: Orders

## 2020-10-24 DIAGNOSIS — M2550 Pain in unspecified joint: Secondary | ICD-10-CM | POA: Diagnosis not present

## 2020-10-24 DIAGNOSIS — E291 Testicular hypofunction: Secondary | ICD-10-CM | POA: Diagnosis not present

## 2020-10-27 DIAGNOSIS — M75121 Complete rotator cuff tear or rupture of right shoulder, not specified as traumatic: Secondary | ICD-10-CM | POA: Diagnosis not present

## 2020-10-30 DIAGNOSIS — I2510 Atherosclerotic heart disease of native coronary artery without angina pectoris: Secondary | ICD-10-CM | POA: Diagnosis not present

## 2020-11-28 ENCOUNTER — Other Ambulatory Visit: Payer: Self-pay | Admitting: Sports Medicine

## 2020-11-28 DIAGNOSIS — M180 Bilateral primary osteoarthritis of first carpometacarpal joints: Secondary | ICD-10-CM

## 2020-12-05 DIAGNOSIS — M7581 Other shoulder lesions, right shoulder: Secondary | ICD-10-CM | POA: Diagnosis not present

## 2020-12-05 DIAGNOSIS — Y999 Unspecified external cause status: Secondary | ICD-10-CM | POA: Diagnosis not present

## 2020-12-05 DIAGNOSIS — X58XXXA Exposure to other specified factors, initial encounter: Secondary | ICD-10-CM | POA: Diagnosis not present

## 2020-12-05 DIAGNOSIS — S46011A Strain of muscle(s) and tendon(s) of the rotator cuff of right shoulder, initial encounter: Secondary | ICD-10-CM | POA: Diagnosis not present

## 2020-12-05 DIAGNOSIS — G8918 Other acute postprocedural pain: Secondary | ICD-10-CM | POA: Diagnosis not present

## 2020-12-05 DIAGNOSIS — M7541 Impingement syndrome of right shoulder: Secondary | ICD-10-CM | POA: Diagnosis not present

## 2020-12-05 DIAGNOSIS — M7521 Bicipital tendinitis, right shoulder: Secondary | ICD-10-CM | POA: Diagnosis not present

## 2020-12-13 ENCOUNTER — Other Ambulatory Visit: Payer: Self-pay

## 2020-12-13 ENCOUNTER — Encounter: Payer: Self-pay | Admitting: Family Medicine

## 2020-12-13 ENCOUNTER — Ambulatory Visit (INDEPENDENT_AMBULATORY_CARE_PROVIDER_SITE_OTHER): Payer: BC Managed Care – PPO | Admitting: Family Medicine

## 2020-12-13 VITALS — BP 134/87 | HR 98 | Wt 266.0 lb

## 2020-12-13 DIAGNOSIS — R7301 Impaired fasting glucose: Secondary | ICD-10-CM

## 2020-12-13 DIAGNOSIS — I10 Essential (primary) hypertension: Secondary | ICD-10-CM | POA: Diagnosis not present

## 2020-12-13 LAB — BASIC METABOLIC PANEL WITH GFR
BUN: 15 mg/dL (ref 7–25)
CO2: 30 mmol/L (ref 20–32)
Calcium: 9.4 mg/dL (ref 8.6–10.3)
Chloride: 100 mmol/L (ref 98–110)
Creat: 0.91 mg/dL (ref 0.70–1.25)
GFR, Est African American: 105 mL/min/{1.73_m2} (ref >=60)
GFR, Est Non African American: 91 mL/min/{1.73_m2} (ref >=60)
Glucose, Bld: 74 mg/dL (ref 65–139)
Potassium: 4.2 mmol/L (ref 3.5–5.3)
Sodium: 139 mmol/L (ref 135–146)

## 2020-12-13 LAB — POCT GLYCOSYLATED HEMOGLOBIN (HGB A1C): HbA1c POC (<> result, manual entry): 4.9 % (ref 4.0–5.6)

## 2020-12-13 NOTE — Progress Notes (Signed)
Established Patient Office Visit  Subjective:  Patient ID: Bobby White, male    DOB: 10/25/1961  Age: 59 y.o. MRN: [id number]  CC:  Chief Complaint  Patient presents with  . Hypertension  . impaired fasting glucose       HPI Bobby White presents for 6 mo f/u  Hypertension- Pt denies chest pain, SOB, dizziness, or heart palpitations.  Taking meds as directed w/o problems.  Denies medication side effects.    Impaired fasting glucose-no increased thirst or urination. No symptoms consistent with hypoglycemia.  Just underwent Right shoulder surgery. In a sling today. Will be out of work x 13 weeks.  He is really quite bored.  He is continue to work on weight loss though and is down about an additional 7 pounds from when I last saw him which is great.  He was sick over the holidays with COVID but says he is feeling much better.  Past Medical History:  Diagnosis Date  . Arthritis   . Coronary artery disease   . GERD (gastroesophageal reflux disease)   . Hepatitis    Hep C --had to get shots  . Hypertension 1/10  . Impaired fasting glucose 2009  . Obesity   . Sleep apnea   . Varicose vein     Past Surgical History:  Procedure Laterality Date  . CARDIAC CATHETERIZATION     2012  . CORONARY ARTERY BYPASS GRAFT  07/25/11   3 vessel  . pins in right hand  1990's   4th and 5th digits-- removed 6 weeks afterwards  . TOTAL KNEE ARTHROPLASTY Right 09/06/2013   Dr Mayer Camel  . TOTAL KNEE ARTHROPLASTY Right 09/06/2013   Procedure: TOTAL KNEE ARTHROPLASTY;  Surgeon: Kerin Salen, MD;  Location: [facility];  Service: Orthopedics;  Laterality: Right;  . TOTAL KNEE ARTHROPLASTY Left 11/01/2013   DR Mayer Camel  . TOTAL KNEE ARTHROPLASTY Left 11/01/2013   Procedure: TOTAL KNEE ARTHROPLASTY- LEFT;  Surgeon: Kerin Salen, MD;  Location: [facility];  Service: Orthopedics;  Laterality: Left;    Family History  Problem Relation Age of Onset  . Diabetes Mother   . Cancer Father         pancreatic    Social History   Socioeconomic History  . Marital status: Married    Spouse name: Not on file  . Number of children: Not on file  . Years of education: Not on file  . Highest education level: Not on file  Occupational History  . Not on file  Tobacco Use  . Smoking status: Former Smoker    Packs/day: 1.50    Years: 15.00    Pack years: 22.50    Types: Cigarettes    Quit date: 11/18/1996    Years since quitting: 24.0  . Smokeless tobacco: Never Used  Substance and Sexual Activity  . Alcohol use: Yes    Comment: socially  . Drug use: Yes    Types: Marijuana    Comment: last time 2012 after his heart surgery  . Sexual activity: Not on file  Other Topics Concern  . Not on file  Social History Narrative  . Not on file   Social Determinants of Health   Financial Resource Strain: Not on file  Food Insecurity: Not on file  Transportation Needs: Not on file  Physical Activity: Not on file  Stress: Not on file  Social Connections: Not on file  Intimate Partner Violence: Not on file    Outpatient Medications  Prior to Visit  Medication Sig Dispense Refill  . AMBULATORY NON FORMULARY MEDICATION Take 25 mg by mouth daily. Medication Name: DIM 25 mg    . amitriptyline (ELAVIL) 75 MG tablet Take 1 tablet (75 mg total) by mouth at bedtime. 30 tablet 3  . amLODipine (NORVASC) 5 MG tablet TAKE 1 TABLET DAILY 90 tablet 1  . aspirin EC 325 MG tablet Take 1 tablet (325 mg total) by mouth 2 (two) times daily. 30 tablet 0  . atorvastatin (LIPITOR) 40 MG tablet TAKE 1 TABLET BY MOUTH DAILY 90 tablet 3  . b complex vitamins tablet Take 4 tablets by mouth daily.    . Cinnamon 500 MG capsule Take 500 mg by mouth 2 (two) times daily.    Marland Kitchen DHEA 25 MG CAPS Take 25 mg by mouth daily.    . diclofenac (VOLTAREN) 75 MG EC tablet TAKE 1 TABLET BY MOUTH TWICE DAILY 180 tablet 3  . Ginger, Zingiber officinalis, (GINGER ROOT) 250 MG CAPS Take by mouth.    Marland Kitchen GLUCOMANNAN KONJAC ROOT PO  Take 1,500 mg by mouth daily.    Marland Kitchen KRILL OIL PO Take 1 capsule by mouth daily.    . metoprolol succinate (TOPROL-XL) 25 MG 24 hr tablet TAKE 1 TABLET BY MOUTH DAILY GENERIC EQUIVALENT FOR TOPROX- XL 90 tablet 1  . Multiple Vitamins-Minerals (MULTIVITAMIN WITH MINERALS) tablet Take 1 tablet by mouth daily.    . Omega-3 Fatty Acids (FISH OIL) 1000 MG CAPS Take 1,000 mg by mouth daily.    Marland Kitchen omeprazole (PRILOSEC) 40 MG capsule TAKE 1 CAPSULE DAILY 90 capsule 3  . pyridOXINE (VITAMIN B-6) 50 MG tablet Take 1 tablet (50 mg total) by mouth daily. 90 tablet 3  . tamsulosin (FLOMAX) 0.4 MG CAPS capsule TAKE 2 CAPSULES BY MOUTH DAILY 180 capsule 3  . Turmeric 500 MG CAPS Take 1 capsule by mouth daily.    . nitroGLYCERIN (NITROSTAT) 0.4 MG SL tablet Place 1 tablet (0.4 mg total) under the tongue every 5 (five) minutes as needed for chest pain. 12 tablet prn  . albuterol (PROVENTIL HFA;VENTOLIN HFA) 108 (90 BASE) MCG/ACT inhaler Inhale 2 puffs into the lungs every 6 (six) hours as needed for wheezing.    . sildenafil (REVATIO) 20 MG tablet Take 2-5 tablets (40-100 mg total) by mouth daily as needed. 60 tablet 1   No facility-administered medications prior to visit.    Allergies  Allergen Reactions  . Poison Ivy Extract Itching and Rash  . Ace Inhibitors     REACTION: cough  . Bactrim [Sulfamethoxazole-Trimethoprim]     ROS Review of Systems    Objective:    Physical Exam Constitutional:      Appearance: He is well-developed and well-nourished.  HENT:     Head: Normocephalic and atraumatic.  Cardiovascular:     Rate and Rhythm: Normal rate and regular rhythm.     Heart sounds: Normal heart sounds.  Pulmonary:     Effort: Pulmonary effort is normal.     Breath sounds: Normal breath sounds.  Skin:    General: Skin is warm and dry.  Neurological:     Mental Status: He is alert and oriented to person, place, and time.  Psychiatric:        Mood and Affect: Mood and affect normal.         Behavior: Behavior normal.     BP 134/87   Pulse 98   Wt 266 lb (120.7 kg)   SpO2  99%   BMI 37.10 kg/m  Wt Readings from Last 3 Encounters:  12/13/20 266 lb (120.7 kg)  06/12/20 (!) 273 lb (123.8 kg)  12/13/19 279 lb (126.6 kg)     Health Maintenance Due  Topic Date Due  . COVID-19 Vaccine (1) Never done  . COLONOSCOPY (Pts 45-27yrs Insurance coverage will need to be confirmed)  08/03/2020    There are no preventive care reminders to display for this patient.  Lab Results  Component Value Date   TSH 1.66 11/23/2019   TSH 1.663 11/23/2019   Lab Results  Component Value Date   WBC 5.1 06/12/2020   HGB 15.8 06/12/2020   HCT 48.4 06/12/2020   MCV 84.3 06/12/2020   PLT 239 06/12/2020   Lab Results  Component Value Date   NA 140 06/12/2020   K 4.3 06/12/2020   CO2 27 06/12/2020   GLUCOSE 128 06/12/2020   BUN 22 06/12/2020   CREATININE 0.77 06/12/2020   BILITOT 0.6 06/12/2020   ALKPHOS 95 05/19/2017   AST 18 06/12/2020   ALT 23 06/12/2020   PROT 6.3 06/12/2020   ALBUMIN 4.5 05/19/2017   CALCIUM 8.6 06/12/2020   Lab Results  Component Value Date   CHOL 135 06/12/2020   Lab Results  Component Value Date   HDL 44 06/12/2020   Lab Results  Component Value Date   LDLCALC 74 06/12/2020   Lab Results  Component Value Date   TRIG 88 06/12/2020   Lab Results  Component Value Date   CHOLHDL 3.1 06/12/2020   Lab Results  Component Value Date   HGBA1C 4.9 12/13/2020      Assessment & Plan:   Problem List Items Addressed This Visit      Cardiovascular and Mediastinum   HYPERTENSION, BENIGN ESSENTIAL - Primary    Blood pressure little borderline today we will keep an eye on it.  Ideally under 130 but overall looks okay.  Great job on recent weight loss.  He says he is continuing to work on it.  Follow-up in 6 months.      Relevant Orders   BASIC METABOLIC PANEL WITH GFR     Endocrine   IMPAIRED FASTING GLUCOSE    His A1c looks absolutely  phenomenal today.  He is really doing great in fact the A1c is back into the normal range so we could really start rechecking again every 12 months instead of every 6 months.  Great job on weight loss.      Relevant Orders   POCT HgB A1C (Completed)    Other Visit Diagnoses    Impaired fasting blood sugar          No orders of the defined types were placed in this encounter.   Follow-up: Return in about 6 months (around 06/12/2021) for Hypertension.    Beatrice Lecher, MD

## 2020-12-13 NOTE — Assessment & Plan Note (Signed)
His A1c looks absolutely phenomenal today.  He is really doing great in fact the A1c is back into the normal range so we could really start rechecking again every 12 months instead of every 6 months.  Great job on weight loss.

## 2020-12-13 NOTE — Assessment & Plan Note (Signed)
Blood pressure little borderline today we will keep an eye on it.  Ideally under 130 but overall looks okay.  Great job on recent weight loss.  He says he is continuing to work on it.  Follow-up in 6 months.

## 2020-12-14 NOTE — Progress Notes (Signed)
All labs are normal. 

## 2020-12-27 ENCOUNTER — Other Ambulatory Visit: Payer: Self-pay

## 2020-12-27 ENCOUNTER — Encounter: Payer: Self-pay | Admitting: Physical Therapy

## 2020-12-27 ENCOUNTER — Ambulatory Visit (INDEPENDENT_AMBULATORY_CARE_PROVIDER_SITE_OTHER): Payer: BC Managed Care – PPO | Admitting: Physical Therapy

## 2020-12-27 DIAGNOSIS — M6281 Muscle weakness (generalized): Secondary | ICD-10-CM

## 2020-12-27 DIAGNOSIS — R600 Localized edema: Secondary | ICD-10-CM | POA: Diagnosis not present

## 2020-12-27 DIAGNOSIS — M79601 Pain in right arm: Secondary | ICD-10-CM | POA: Diagnosis not present

## 2020-12-27 NOTE — Therapy (Signed)
[place], [name], [address] Phone: [phone]   Fax:  [phone]  Physical Therapy Evaluation  Patient Details  Name: Bobby White MRN: [id number] Date of Birth: 1961-10-20 Referring Provider (PT): Tania Ade   Encounter Date: 12/27/2020   PT End of Session - 12/27/20 0804    Visit Number 1    Number of Visits 16    Date for PT Re-Evaluation 02/21/21    PT Start Time 0718    PT Stop Time 0810    PT Time Calculation (min) 52 min    Activity Tolerance Patient tolerated treatment well    Behavior During Therapy [facility] for tasks assessed/performed           Past Medical History:  Diagnosis Date  . Arthritis   . Coronary artery disease   . GERD (gastroesophageal reflux disease)   . Hepatitis    Hep C --had to get shots  . Hypertension 1/10  . Impaired fasting glucose 2009  . Obesity   . Sleep apnea   . Varicose vein     Past Surgical History:  Procedure Laterality Date  . CARDIAC CATHETERIZATION     2012  . CORONARY ARTERY BYPASS GRAFT  07/25/11   3 vessel  . pins in right hand  1990's   4th and 5th digits-- removed 6 weeks afterwards  . TOTAL KNEE ARTHROPLASTY Right 09/06/2013   Dr Mayer Camel  . TOTAL KNEE ARTHROPLASTY Right 09/06/2013   Procedure: TOTAL KNEE ARTHROPLASTY;  Surgeon: Kerin Salen, MD;  Location: [facility];  Service: Orthopedics;  Laterality: Right;  . TOTAL KNEE ARTHROPLASTY Left 11/01/2013   DR Mayer Camel  . TOTAL KNEE ARTHROPLASTY Left 11/01/2013   Procedure: TOTAL KNEE ARTHROPLASTY- LEFT;  Surgeon: Kerin Salen, MD;  Location: [facility];  Service: Orthopedics;  Laterality: Left;    There were no vitals filed for this visit.    Subjective Assessment - 12/27/20 0727    Subjective Pt had a fall injury at work and tore his Rt rotator cuff. Pt had rotator cuff repair 12/05/20. Pt out of work x 13 weeks. Pt wearing sling "most of the time".  Pain is worse with movement,  eases with changing position    Pertinent History shoulder injury as a child    Limitations Lifting;House hold activities    Diagnostic tests x ray shows arthritis, MRI showed rotator cuff tear    Patient Stated Goals return to work and recreation activities    Currently in Pain? No/denies              [facility] PT Assessment - 12/27/20 0001      Assessment   Medical Diagnosis Rt rotator cuff repair    Referring Provider (PT) Tania Ade    Onset Date/Surgical Date 12/05/20      Precautions   Precaution Comments per protocol      Balance Screen   Has the patient fallen in the past 6 months No      Prior Function   Level of Independence Independent      Observation/Other Assessments   Focus on Therapeutic Outcomes (FOTO)  25 functional status measure      Posture/Postural Control   Posture Comments Pt in Rt UE sling      ROM / Strength   AROM / PROM / Strength PROM;Strength      PROM   PROM Assessment Site Shoulder    Right/Left Shoulder Right  Right Shoulder Flexion 80 Degrees    Right Shoulder External Rotation 20 Degrees      Strength   Strength Assessment Site Elbow    Right/Left Elbow Right    Right Elbow Flexion 3+/5    Right Elbow Extension 3+/5                      Objective measurements completed on examination: See above findings.       [facility] Adult PT Treatment/Exercise - 12/27/20 0001      Exercises   Exercises Elbow;Shoulder      Elbow Exercises   Elbow Flexion Limitations 3# x 30    Elbow Extension AROM;Right;Other reps (comment)   3 x 10   Theraband Level (Elbow Extension) Level 2 (Red)      Shoulder Exercises: Standing   Other Standing Exercises pendulum 2 x 10 horizontal, laterally, CW/CCW      Modalities   Modalities Vasopneumatic      Vasopneumatic   Number Minutes Vasopneumatic  10 minutes    Vasopnuematic Location  Shoulder    Vasopneumatic Pressure Low    Vasopneumatic Temperature  34                   PT Education - 12/27/20 0803    Education Details PT POC, goals, HEP    Person(s) Educated Patient    Methods Explanation;Demonstration;Handout    Comprehension Verbalized understanding;Returned demonstration               PT Long Term Goals - 12/27/20 0821      PT LONG TERM GOAL #1   Title Pt will be independent with HEP    Time 8    Period Weeks    Status New    Target Date 02/21/21      PT LONG TERM GOAL #2   Title Pt will improve FOTO to >= 72 to demo improved functional abilities    Baseline 25    Time 8    Period Weeks    Status New    Target Date 02/21/21      PT LONG TERM GOAL #3   Title Pt will improve Rt shoulder strength to 3+/5 to progress towards return to work    Time 8    Period Weeks    Status New    Target Date 02/21/21      PT LONG TERM GOAL #4   Title Pt will be able to don a t-shirt independently to demo improved Rt UE ROM    Time 8    Period Weeks    Status New    Target Date 02/21/21                  Plan - 12/27/20 0814    Clinical Impression Statement Pt is a 60 y/o male s/p Rt rotator cuff surgery. Pt presents with decreased shoulder strength, ROM, and functional use as well as increased pain and discomfort. Pt will benefit from skilled PT to address deficits and improve functional abilities    Personal Factors and Comorbidities Profession    Examination-Activity Limitations Carry;Lift;Reach Overhead    Examination-Participation Restrictions Occupation;Cleaning;Driving;Yard Work    Stability/Clinical Decision Making Stable/Uncomplicated    Clinical Decision Making Low    Rehab Potential Good    PT Frequency 2x / week    PT Duration 8 weeks    PT Treatment/Interventions ADLs/Self Care Home Management;Cryotherapy;Electrical Stimulation;Moist Heat;Iontophoresis 4mg /ml Dexamethasone;Therapeutic activities;Therapeutic exercise;Neuromuscular re-education;Vasopneumatic Device;Manual  techniques;Taping;Patient/family  education;Passive range of motion;Dry needling    PT Next Visit Plan continue per protocol. PROM and modalities    Consulted and Agree with Plan of Care Patient           Patient will benefit from skilled therapeutic intervention in order to improve the following deficits and impairments:  Increased edema,Decreased strength,Decreased mobility,Impaired flexibility,Decreased activity tolerance,Pain,Impaired UE functional use,Decreased range of motion  Visit Diagnosis: Localized edema - Plan: PT plan of care cert/re-cert  Pain of right upper extremity - Plan: PT plan of care cert/re-cert  Muscle weakness (generalized) - Plan: PT plan of care cert/re-cert     Problem List Patient Active Problem List   Diagnosis Date Noted  . 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 Rotator cuff syndrome, right 02/04/2019  . Primary osteoarthritis of both first carpometacarpal joints 06/03/2017  . BPH (benign prostatic hyperplasia) 04/26/2016  . Arthritis of left knee 11/01/2013  . Arthritis of knee, right 09/08/2013  . Obstructive sleep apnea 07/14/2013  . Left Achilles tendonitis 04/23/2013  . Low back pain 04/23/2013  . Osteoarthritis of both knees 03/19/2013  . Coronary atherosclerosis 09/06/2011  . 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  . Status post coronary artery bypass grafting 08/27/2011  . Hyperlipidemia with target low density lipoprotein (LDL) cholesterol less than 70 mg/dL 01/10/2011  . OBESITY, UNSPECIFIED 01/09/2011  . WEIGHT GAIN 01/09/2011  . BACK STRAIN 10/02/2010  . ADJUSTMENT DISORDER WITH DEPRESSED MOOD 03/31/2009  . HYPERTENSION, BENIGN ESSENTIAL 11/21/2008  . VARICOSE VEINS LOWER EXTREMITIES W/OTH COMPS 08/31/2008  . IMPAIRED FASTING GLUCOSE 08/31/2008  . COLONIC POLYPS 04/20/2008  . GERD 03/16/2008   Jaqlyn Gruenhagen, PT   Arma Reining 12/27/2020, 8:27 AM  [facility] [place], [name], [address] Phone: [phone]   Fax:  [phone]  Name: Bobby White MRN: [id number] Date of Birth: July 01, 1961

## 2020-12-27 NOTE — Patient Instructions (Signed)
Access Code: [email or web address] URL: [email or web address] Date: 12/27/2020 Prepared by: Isabelle Course  Exercises Horizontal Shoulder Pendulum with Table Support - 1 x daily - 7 x weekly - 3 sets - 10 reps Flexion-Extension Shoulder Pendulum with Table Support - 1 x daily - 7 x weekly - 3 sets - 10 reps Circular Shoulder Pendulum with Table Support - 1 x daily - 7 x weekly - 3 sets - 10 reps Seated Bicep Curls Supinated with Dumbbells - 1 x daily - 7 x weekly - 3 sets - 10 reps Standing Elbow Extension with Self-Anchored Resistance - 1 x daily - 7 x weekly - 3 sets - 10 reps

## 2020-12-29 ENCOUNTER — Encounter: Payer: BC Managed Care – PPO | Admitting: Physical Therapy

## 2021-01-03 ENCOUNTER — Other Ambulatory Visit: Payer: Self-pay

## 2021-01-03 ENCOUNTER — Ambulatory Visit (INDEPENDENT_AMBULATORY_CARE_PROVIDER_SITE_OTHER): Payer: BC Managed Care – PPO | Admitting: Physical Therapy

## 2021-01-03 DIAGNOSIS — M79601 Pain in right arm: Secondary | ICD-10-CM

## 2021-01-03 DIAGNOSIS — R600 Localized edema: Secondary | ICD-10-CM | POA: Diagnosis not present

## 2021-01-03 DIAGNOSIS — M6281 Muscle weakness (generalized): Secondary | ICD-10-CM

## 2021-01-03 NOTE — Therapy (Signed)
[place], [name], [address] Phone: [phone]   Fax:  [phone]  Physical Therapy Treatment  Patient Details  Name: Bobby White MRN: [id number] Date of Birth: 1961/01/29 Referring Provider (PT): Tania Ade   Encounter Date: 01/03/2021   PT End of Session - 01/03/21 0759    Visit Number 2    Number of Visits 16    Date for PT Re-Evaluation 02/21/21    PT Start Time 0715    PT Stop Time 0800    PT Time Calculation (min) 45 min    Activity Tolerance Patient tolerated treatment well    Behavior During Therapy [facility] for tasks assessed/performed           Past Medical History:  Diagnosis Date  . Arthritis   . Coronary artery disease   . GERD (gastroesophageal reflux disease)   . Hepatitis    Hep C --had to get shots  . Hypertension 1/10  . Impaired fasting glucose 2009  . Obesity   . Sleep apnea   . Varicose vein     Past Surgical History:  Procedure Laterality Date  . CARDIAC CATHETERIZATION     2012  . CORONARY ARTERY BYPASS GRAFT  07/25/11   3 vessel  . pins in right hand  1990's   4th and 5th digits-- removed 6 weeks afterwards  . TOTAL KNEE ARTHROPLASTY Right 09/06/2013   Dr Mayer Camel  . TOTAL KNEE ARTHROPLASTY Right 09/06/2013   Procedure: TOTAL KNEE ARTHROPLASTY;  Surgeon: Kerin Salen, MD;  Location: [place];  Service: Orthopedics;  Laterality: Right;  . TOTAL KNEE ARTHROPLASTY Left 11/01/2013   DR Mayer Camel  . TOTAL KNEE ARTHROPLASTY Left 11/01/2013   Procedure: TOTAL KNEE ARTHROPLASTY- LEFT;  Surgeon: Kerin Salen, MD;  Location: [place];  Service: Orthopedics;  Laterality: Left;    There were no vitals filed for this visit.   Subjective Assessment - 01/03/21 0718    Subjective Pt states he has been performing HEP and is feeling better    Patient Stated Goals return to work and recreation activities    Currently in Pain? No/denies                              [facility] Adult PT Treatment/Exercise - 01/03/21 0001      Elbow Exercises   Elbow Flexion Limitations 3# x 30    Elbow Extension AROM;Right    Theraband Level (Elbow Extension) Level 2 (Red)      Shoulder Exercises: Standing   Other Standing Exercises pendulum x 30 fwd/bkwd, laterally, CW/CCW      Vasopneumatic   Number Minutes Vasopneumatic  10 minutes    Vasopnuematic Location  Shoulder    Vasopneumatic Pressure Low    Vasopneumatic Temperature  34      Manual Therapy   Manual Therapy Passive ROM    Passive ROM Rt shoulder flex, ER per protocol                       PT Long Term Goals - 12/27/20 9937      PT LONG TERM GOAL #1   Title Pt will be independent with HEP    Time 8    Period Weeks    Status New    Target Date 02/21/21      PT LONG TERM GOAL #2   Title Pt will  improve FOTO to >= 72 to demo improved functional abilities    Baseline 25    Time 8    Period Weeks    Status New    Target Date 02/21/21      PT LONG TERM GOAL #3   Title Pt will improve Rt shoulder strength to 3+/5 to progress towards return to work    Time 8    Period Weeks    Status New    Target Date 02/21/21      PT LONG TERM GOAL #4   Title Pt will be able to don a t-shirt independently to demo improved Rt UE ROM    Time 8    Period Weeks    Status New    Target Date 02/21/21                 Plan - 01/03/21 0759    Clinical Impression Statement Pt continues with mm guarding into ER and flexion, responds well to manual therapy. Good tolerance to strengthening exercises    PT Next Visit Plan continue per protocol. PROM and modalities    PT Home Exercise Plan Access Code: [email or web address]    Consulted and Agree with Plan of Care Patient           Patient will benefit from skilled therapeutic intervention in order to improve the following deficits and impairments:     Visit Diagnosis: Localized edema  Pain of right upper  extremity  Muscle weakness (generalized)     Problem List Patient Active Problem List   Diagnosis Date Noted  . 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 Rotator cuff syndrome, right 02/04/2019  . Primary osteoarthritis of both first carpometacarpal joints 06/03/2017  . BPH (benign prostatic hyperplasia) 04/26/2016  . Arthritis of left knee 11/01/2013  . Arthritis of knee, right 09/08/2013  . Obstructive sleep apnea 07/14/2013  . Left Achilles tendonitis 04/23/2013  . Low back pain 04/23/2013  . Osteoarthritis of both knees 03/19/2013  . Coronary atherosclerosis 09/06/2011  . 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  . Status post coronary artery bypass grafting 08/27/2011  . Hyperlipidemia with target low density lipoprotein (LDL) cholesterol less than 70 mg/dL 01/10/2011  . OBESITY, UNSPECIFIED 01/09/2011  . WEIGHT GAIN 01/09/2011  . BACK STRAIN 10/02/2010  . ADJUSTMENT DISORDER WITH DEPRESSED MOOD 03/31/2009  . HYPERTENSION, BENIGN ESSENTIAL 11/21/2008  . VARICOSE VEINS LOWER EXTREMITIES W/OTH COMPS 08/31/2008  . IMPAIRED FASTING GLUCOSE 08/31/2008  . COLONIC POLYPS 04/20/2008  . GERD 03/16/2008   DONAWERTH,KAREN, PT  DONAWERTH,KAREN 01/03/2021, 8:05 AM  [facility] [place], [name], [address] Phone: [phone]   Fax:  [phone]  Name: BARAKA KLATT MRN: [id number] Date of Birth: 31-Mar-1961

## 2021-01-05 ENCOUNTER — Encounter: Payer: BC Managed Care – PPO | Admitting: Physical Therapy

## 2021-01-12 ENCOUNTER — Other Ambulatory Visit: Payer: Self-pay

## 2021-01-12 ENCOUNTER — Ambulatory Visit (INDEPENDENT_AMBULATORY_CARE_PROVIDER_SITE_OTHER): Payer: BC Managed Care – PPO | Admitting: Physical Therapy

## 2021-01-12 ENCOUNTER — Encounter: Payer: Self-pay | Admitting: Physical Therapy

## 2021-01-12 DIAGNOSIS — M79601 Pain in right arm: Secondary | ICD-10-CM

## 2021-01-12 DIAGNOSIS — M6281 Muscle weakness (generalized): Secondary | ICD-10-CM | POA: Diagnosis not present

## 2021-01-12 DIAGNOSIS — R600 Localized edema: Secondary | ICD-10-CM | POA: Diagnosis not present

## 2021-01-12 DIAGNOSIS — R29898 Other symptoms and signs involving the musculoskeletal system: Secondary | ICD-10-CM | POA: Diagnosis not present

## 2021-01-12 NOTE — Therapy (Signed)
[place], [name], [address] Phone: [phone]   Fax:  [phone]  Physical Therapy Treatment  Patient Details  Name: Bobby White MRN: [id number] Date of Birth: 1960-12-11 Referring Provider (PT): Tania Ade   Encounter Date: 01/12/2021   PT End of Session - 01/12/21 0848    Visit Number 3    Number of Visits 16    Date for PT Re-Evaluation 02/21/21    PT Start Time 0803    PT Stop Time 0848    PT Time Calculation (min) 45 min    Activity Tolerance Patient tolerated treatment well;No increased pain    Behavior During Therapy WFL for tasks assessed/performed           Past Medical History:  Diagnosis Date  . Arthritis   . Coronary artery disease   . GERD (gastroesophageal reflux disease)   . Hepatitis    Hep C --had to get shots  . Hypertension 1/10  . Impaired fasting glucose 2009  . Obesity   . Sleep apnea   . Varicose vein     Past Surgical History:  Procedure Laterality Date  . CARDIAC CATHETERIZATION     2012  . CORONARY ARTERY BYPASS GRAFT  07/25/11   3 vessel  . pins in right hand  1990's   4th and 5th digits-- removed 6 weeks afterwards  . TOTAL KNEE ARTHROPLASTY Right 09/06/2013   Dr Mayer Camel  . TOTAL KNEE ARTHROPLASTY Right 09/06/2013   Procedure: TOTAL KNEE ARTHROPLASTY;  Surgeon: Kerin Salen, MD;  Location: [facility];  Service: Orthopedics;  Laterality: Right;  . TOTAL KNEE ARTHROPLASTY Left 11/01/2013   DR Mayer Camel  . TOTAL KNEE ARTHROPLASTY Left 11/01/2013   Procedure: TOTAL KNEE ARTHROPLASTY- LEFT;  Surgeon: Kerin Salen, MD;  Location: [facility];  Service: Orthopedics;  Laterality: Left;    There were no vitals filed for this visit.   Subjective Assessment - 01/12/21 0858    Subjective Pt reports no new changes with Rt shoulder.  He reports he lifted his 4# cat with his Rt arm to his chest this morning, without difficulty. He hasn't taken any pain medication  in over a week.    Patient Stated Goals return to work and recreation activities    Currently in Pain? No/denies              [facility] PT Assessment - 01/12/21 0001      Assessment   Medical Diagnosis Rt rotator cuff repair    Referring Provider (PT) Tania Ade    Onset Date/Surgical Date 12/05/20    Hand Dominance Right    Next MD Visit 01/15/21      PROM   PROM Assessment Site Shoulder    Right/Left Shoulder Right    Right Shoulder Extension 49 Degrees    Right Shoulder Flexion 148 Degrees    Right Shoulder ABduction 110 Degrees    Right Shoulder External Rotation 55 Degrees   supine, scapular plane             [facility] Adult PT Treatment/Exercise - 01/12/21 0001      Elbow Exercises   Elbow Flexion AROM;Both;10 reps   cane     Shoulder Exercises: Supine   Flexion Both;10 reps;AAROM   cane, press up and then overhead     Shoulder Exercises: Prone   Other Prone Exercises Rt scap retraction x 8 reps, then scap retraction with prone row to neutral x  10.      Shoulder Exercises: Standing   External Rotation AAROM;Both;10 reps   elbows at side, gentle - to tolerance   Internal Rotation AAROM;Right;10 reps   gentle, to tissue limits and no pain.   Extension AAROM;Both;10 reps   cane, gentle to tolerance   Other Standing Exercises shoulder rolls x 10 backwards      Vasopneumatic   Number Minutes Vasopneumatic  10 minutes    Vasopnuematic Location  Shoulder    Vasopneumatic Pressure Low    Vasopneumatic Temperature  34      Manual Therapy   Passive ROM Rt shoulder flexion, scaption, ER, IR, ext to tissue limits and no pain.             PT Long Term Goals - 12/27/20 6283      PT LONG TERM GOAL #1   Title Pt will be independent with HEP    Time 8    Period Weeks    Status New    Target Date 02/21/21      PT LONG TERM GOAL #2   Title Pt will improve FOTO to >= 72 to demo improved functional abilities    Baseline 25    Time 8    Period Weeks    Status  New    Target Date 02/21/21      PT LONG TERM GOAL #3   Title Pt will improve Rt shoulder strength to 3+/5 to progress towards return to work    Time 8    Period Weeks    Status New    Target Date 02/21/21      PT LONG TERM GOAL #4   Title Pt will be able to don a t-shirt independently to demo improved Rt UE ROM    Time 8    Period Weeks    Status New    Target Date 02/21/21                 Plan - 01/12/21 0848    Clinical Impression Statement Pt is now 5 wks s/p Rt rotator cuff surgery.  Rt shoulder PROM improved; minimal guarding.  Added gentle AAROM to tissue limits and no pain, without difficulty. Reminded pt to maintain surgical rehab precautions.   Pt progressing well within rehab protocol.    PT Next Visit Plan continue per protocol. PROM and modalities    PT Home Exercise Plan Access Code: [email or web address]    Consulted and Agree with Plan of Care Patient           Patient will benefit from skilled therapeutic intervention in order to improve the following deficits and impairments:     Visit Diagnosis: Pain of right upper extremity  Muscle weakness (generalized)  Other symptoms and signs involving the musculoskeletal system  Localized edema     Problem List Patient Active Problem List   Diagnosis Date Noted  . 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 Rotator cuff syndrome, right 02/04/2019  . Primary osteoarthritis of both first carpometacarpal joints 06/03/2017  . BPH (benign prostatic hyperplasia) 04/26/2016  . Arthritis of left knee 11/01/2013  . Arthritis of knee, right 09/08/2013  . Obstructive sleep apnea 07/14/2013  . Left Achilles tendonitis 04/23/2013  . Low back pain 04/23/2013  . Osteoarthritis of both knees 03/19/2013  . Coronary atherosclerosis 09/06/2011  . 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  . Status post coronary artery bypass grafting 08/27/2011  . Hyperlipidemia with target low density lipoprotein (LDL) cholesterol  less  than 70 mg/dL 01/10/2011  . OBESITY, UNSPECIFIED 01/09/2011  . WEIGHT GAIN 01/09/2011  . BACK STRAIN 10/02/2010  . ADJUSTMENT DISORDER WITH DEPRESSED MOOD 03/31/2009  . HYPERTENSION, BENIGN ESSENTIAL 11/21/2008  . VARICOSE VEINS LOWER EXTREMITIES W/OTH COMPS 08/31/2008  . IMPAIRED FASTING GLUCOSE 08/31/2008  . COLONIC POLYPS 04/20/2008  . GERD 03/16/2008    Shelbie Hutching 01/12/2021, 9:02 AM  [facility] [place], [name], [address] Phone: [phone]   Fax:  [phone]  Name: HARIS BAACK MRN: [id number] Date of Birth: [birth year]

## 2021-01-19 ENCOUNTER — Other Ambulatory Visit: Payer: Self-pay

## 2021-01-19 ENCOUNTER — Encounter: Payer: Self-pay | Admitting: Physical Therapy

## 2021-01-19 ENCOUNTER — Ambulatory Visit (INDEPENDENT_AMBULATORY_CARE_PROVIDER_SITE_OTHER): Payer: BC Managed Care – PPO | Admitting: Physical Therapy

## 2021-01-19 DIAGNOSIS — M79601 Pain in right arm: Secondary | ICD-10-CM | POA: Diagnosis not present

## 2021-01-19 DIAGNOSIS — R29898 Other symptoms and signs involving the musculoskeletal system: Secondary | ICD-10-CM | POA: Diagnosis not present

## 2021-01-19 DIAGNOSIS — R600 Localized edema: Secondary | ICD-10-CM

## 2021-01-19 DIAGNOSIS — M6281 Muscle weakness (generalized): Secondary | ICD-10-CM | POA: Diagnosis not present

## 2021-01-19 NOTE — Patient Instructions (Signed)
Access Code: [email or web address] URL: [email or web address] Date: 01/19/2021 Prepared by: [place]  Exercises Seated Bicep Curls Supinated with Dumbbells - 1 x daily - 7 x weekly - 3 sets - 10 reps Standing Isometric Shoulder Internal Rotation at Doorway - 1 x daily - 7 x weekly - 1 sets - 10 reps - 10 hold Standing Isometric Shoulder External Rotation with Doorway - 2 x daily - 7 x weekly - 1 sets - 10 reps - 10 hold Standing Isometric Shoulder Flexion with Doorway - Arm Bent - 2 x daily - 7 x weekly - 1 sets - 10 reps - 10 hold Standing Isometric Shoulder Abduction with Doorway - Arm Bent - 2 x daily - 7 x weekly - 1 sets - 10 reps - 10 hold Standing Isometric Shoulder Extension with Doorway - Arm Bent - 2 x daily - 7 x weekly - 1 sets - 10 reps - 10 hold Supine Shoulder External Rotation in 45 Degrees Abduction AAROM with Dowel - 2 x daily - 7 x weekly - 1 sets - 10 reps Supine Shoulder Abduction AAROM with Dowel - 1 x daily - 7 x weekly - 1 sets - 5 reps Supine Shoulder Flexion Extension AAROM with Dowel - 2-3 x daily - 7 x weekly - 1 sets - 10 reps Standing Shoulder Internal Rotation AAROM with Dowel - 2-3 x daily - 7 x weekly - 1 sets - 10 reps Standing Shoulder Extension with Dowel - 2-3 x daily - 7 x weekly - 1 sets - 10 reps Seated Scapular Retraction - 3 x daily - 7 x weekly - 1 sets - 5 reps - 10 hold

## 2021-01-19 NOTE — Therapy (Signed)
[place], [name], [address] Phone: [phone]   Fax:  [phone]  Physical Therapy Treatment  Patient Details  Name: Bobby White MRN: [id number] Date of Birth: 1961/01/08 Referring Provider (PT): Tania Ade   Encounter Date: 01/19/2021   PT End of Session - 01/19/21 0854    Visit Number 4    Number of Visits 16    Date for PT Re-Evaluation 02/21/21    PT Start Time 0846    PT Stop Time 0927    PT Time Calculation (min) 41 min    Activity Tolerance Patient tolerated treatment well;No increased pain    Behavior During Therapy WFL for tasks assessed/performed           Past Medical History:  Diagnosis Date  . Arthritis   . Coronary artery disease   . GERD (gastroesophageal reflux disease)   . Hepatitis    Hep C --had to get shots  . Hypertension 1/10  . Impaired fasting glucose 2009  . Obesity   . Sleep apnea   . Varicose vein     Past Surgical History:  Procedure Laterality Date  . CARDIAC CATHETERIZATION     2012  . CORONARY ARTERY BYPASS GRAFT  07/25/11   3 vessel  . pins in right hand  1990's   4th and 5th digits-- removed 6 weeks afterwards  . TOTAL KNEE ARTHROPLASTY Right 09/06/2013   Dr Mayer Camel  . TOTAL KNEE ARTHROPLASTY Right 09/06/2013   Procedure: TOTAL KNEE ARTHROPLASTY;  Surgeon: Kerin Salen, MD;  Location: [facility];  Service: Orthopedics;  Laterality: Right;  . TOTAL KNEE ARTHROPLASTY Left 11/01/2013   DR Mayer Camel  . TOTAL KNEE ARTHROPLASTY Left 11/01/2013   Procedure: TOTAL KNEE ARTHROPLASTY- LEFT;  Surgeon: Kerin Salen, MD;  Location: [facility];  Service: Orthopedics;  Laterality: Left;    There were no vitals filed for this visit.   Subjective Assessment - 01/19/21 0849    Subjective Pt reports he had follow up with PA at surgeon's office.  He states he is to "go easy with the overhead motion" and no lifting more than 1# overhead. He reports he has been  sleeping with his Rt arm overhead, "It's the only way I can get comfortable".    Patient Stated Goals return to work and recreation activities    Currently in Pain? No/denies              [facility] PT Assessment - 01/19/21 0001      Assessment   Medical Diagnosis Rt rotator cuff repair    Referring Provider (PT) Tania Ade    Onset Date/Surgical Date 12/05/20    Hand Dominance Right    Next MD Visit 02/19/21            [facility] Adult PT Treatment/Exercise - 01/19/21 0001      Shoulder Exercises: Supine   Horizontal ABduction AAROM;Right;10 reps   cane   Flexion Both;10 reps;AAROM   cane, press up and then overhead     Shoulder Exercises: Standing   Internal Rotation AAROM;Right;10 reps   cane; 2 sets   Extension AAROM;Both;10 reps   cane, gentle to tolerance     Shoulder Exercises: Pulleys   Flexion 3 minutes    ABduction 2 minutes      Shoulder Exercises: Isometric Strengthening   Flexion 5X5"    Extension 5X5"    External Rotation 5X5"    Internal Rotation 5X5"  ABduction 5X5"      Vasopneumatic   Number Minutes Vasopneumatic  10 minutes    Vasopnuematic Location  Shoulder    Vasopneumatic Pressure Low    Vasopneumatic Temperature  34                  PT Education - 01/19/21 0912    Education Details HEP    Person(s) Educated Patient    Methods Explanation;Handout;Verbal cues    Comprehension Verbalized understanding;Returned demonstration               PT Long Term Goals - 01/19/21 0923      PT LONG TERM GOAL #1   Title Pt will be independent with HEP    Time 8    Period Weeks    Status On-going      PT LONG TERM GOAL #2   Title Pt will improve FOTO to >= 72 to demo improved functional abilities    Baseline 25    Time 8    Period Weeks    Status On-going      PT LONG TERM GOAL #3   Title Pt will improve Rt shoulder strength to 3+/5 to progress towards return to work    Time 8    Period Weeks    Status On-going      PT LONG  TERM GOAL #4   Title Pt will be able to don a t-shirt independently to demo improved Rt UE ROM    Time 8    Period Weeks    Status Achieved                 Plan - 01/19/21 0913    Clinical Impression Statement Pt tolerated all new exercises (isometrics, AAROM) well without pain.  Pt again reminded of surgical rehab precautions (ie: no lifting more than 5#).  Pt progressing well within protocol.    Rehab Potential Good    PT Frequency 2x / week    PT Duration 8 weeks    PT Treatment/Interventions ADLs/Self Care Home Management;Cryotherapy;Electrical Stimulation;Moist Heat;Iontophoresis 4mg /ml Dexamethasone;Therapeutic activities;Therapeutic exercise;Neuromuscular re-education;Vasopneumatic Device;Manual techniques;Taping;Patient/family education;Passive range of motion;Dry needling    PT Next Visit Plan continue per protocol. Progress per protocol.    PT Home Exercise Plan Access Code: [email or web address]    Consulted and Agree with Plan of Care Patient           Patient will benefit from skilled therapeutic intervention in order to improve the following deficits and impairments:  Increased edema,Decreased strength,Decreased mobility,Impaired flexibility,Decreased activity tolerance,Pain,Impaired UE functional use,Decreased range of motion  Visit Diagnosis: Pain of right upper extremity  Muscle weakness (generalized)  Other symptoms and signs involving the musculoskeletal system  Localized edema     Problem List Patient Active Problem List   Diagnosis Date Noted  . 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 Rotator cuff syndrome, right 02/04/2019  . Primary osteoarthritis of both first carpometacarpal joints 06/03/2017  . BPH (benign prostatic hyperplasia) 04/26/2016  . Arthritis of left knee 11/01/2013  . Arthritis of knee, right 09/08/2013  . Obstructive sleep apnea 07/14/2013  . Left Achilles tendonitis 04/23/2013  . Low back pain 04/23/2013  . Osteoarthritis of both knees  03/19/2013  . Coronary atherosclerosis 09/06/2011  . 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  . Status post coronary artery bypass grafting 08/27/2011  . Hyperlipidemia with target low density lipoprotein (LDL) cholesterol less than 70 mg/dL 01/10/2011  . OBESITY, UNSPECIFIED 01/09/2011  . WEIGHT GAIN 01/09/2011  .  BACK STRAIN 10/02/2010  . ADJUSTMENT DISORDER WITH DEPRESSED MOOD 03/31/2009  . HYPERTENSION, BENIGN ESSENTIAL 11/21/2008  . VARICOSE VEINS LOWER EXTREMITIES W/OTH COMPS 08/31/2008  . IMPAIRED FASTING GLUCOSE 08/31/2008  . COLONIC POLYPS 04/20/2008  . GERD 03/16/2008   Kerin Perna, PTA 01/19/21 9:24 AM  [place], [name], [address] Phone: [phone]   Fax:  [phone]  Name: CHIEF WALKUP MRN: [id number] Date of Birth: [birth year]

## 2021-01-24 ENCOUNTER — Other Ambulatory Visit: Payer: Self-pay

## 2021-01-24 ENCOUNTER — Ambulatory Visit (INDEPENDENT_AMBULATORY_CARE_PROVIDER_SITE_OTHER): Payer: BC Managed Care – PPO | Admitting: Physical Therapy

## 2021-01-24 DIAGNOSIS — M6281 Muscle weakness (generalized): Secondary | ICD-10-CM | POA: Diagnosis not present

## 2021-01-24 DIAGNOSIS — R29898 Other symptoms and signs involving the musculoskeletal system: Secondary | ICD-10-CM | POA: Diagnosis not present

## 2021-01-24 DIAGNOSIS — M79601 Pain in right arm: Secondary | ICD-10-CM

## 2021-01-24 DIAGNOSIS — R600 Localized edema: Secondary | ICD-10-CM

## 2021-01-24 NOTE — Therapy (Signed)
[place], [name], [address] Phone: [phone]   Fax:  [phone]  Physical Therapy Treatment  Patient Details  Name: Bobby White MRN: [id number] Date of Birth: Mar 02, 1961 Referring Provider (PT): Tania Ade   Encounter Date: 01/24/2021   PT End of Session - 01/24/21 0846    Visit Number 5    Number of Visits 16    Date for PT Re-Evaluation 02/21/21    PT Start Time 0808    PT Stop Time 0850    PT Time Calculation (min) 42 min    Activity Tolerance Patient tolerated treatment well;No increased pain    Behavior During Therapy WFL for tasks assessed/performed           Past Medical History:  Diagnosis Date  . Arthritis   . Coronary artery disease   . GERD (gastroesophageal reflux disease)   . Hepatitis    Hep C --had to get shots  . Hypertension 1/10  . Impaired fasting glucose 2009  . Obesity   . Sleep apnea   . Varicose vein     Past Surgical History:  Procedure Laterality Date  . CARDIAC CATHETERIZATION     2012  . CORONARY ARTERY BYPASS GRAFT  07/25/11   3 vessel  . pins in right hand  1990's   4th and 5th digits-- removed 6 weeks afterwards  . TOTAL KNEE ARTHROPLASTY Right 09/06/2013   Dr Mayer Camel  . TOTAL KNEE ARTHROPLASTY Right 09/06/2013   Procedure: TOTAL KNEE ARTHROPLASTY;  Surgeon: Kerin Salen, MD;  Location: [facility];  Service: Orthopedics;  Laterality: Right;  . TOTAL KNEE ARTHROPLASTY Left 11/01/2013   DR Mayer Camel  . TOTAL KNEE ARTHROPLASTY Left 11/01/2013   Procedure: TOTAL KNEE ARTHROPLASTY- LEFT;  Surgeon: Kerin Salen, MD;  Location: [facility];  Service: Orthopedics;  Laterality: Left;    There were no vitals filed for this visit.   Subjective Assessment - 01/24/21 0808    Subjective Pt states he is hungover from his birthday celebration.  No new complaints about shoulder    Currently in Pain? No/denies                             [facility]  Adult PT Treatment/Exercise - 01/24/21 0001      Shoulder Exercises: Supine   Horizontal ABduction AAROM;20 reps    Flexion AAROM;20 reps      Shoulder Exercises: Standing   Internal Rotation AAROM;Right;20 reps   cane   Flexion AROM;10 reps   scaption to 90   ABduction AROM;10 reps   to 90   Extension AAROM;20 reps   with cane to tolerance     Shoulder Exercises: Pulleys   Flexion 3 minutes    ABduction 2 minutes      Shoulder Exercises: Isometric Strengthening   Flexion 5X5"    Extension 5X5"    External Rotation 5X5"    Internal Rotation 5X5"    ABduction 5X5"      Vasopneumatic   Number Minutes Vasopneumatic  10 minutes    Vasopnuematic Location  Shoulder    Vasopneumatic Pressure Low    Vasopneumatic Temperature  34      Manual Therapy   Passive ROM Rt shoulder flexion, abduction, ER/IR to tissue limits and no pain  PT Long Term Goals - 01/19/21 0093      PT LONG TERM GOAL #1   Title Pt will be independent with HEP    Time 8    Period Weeks    Status On-going      PT LONG TERM GOAL #2   Title Pt will improve FOTO to >= 72 to demo improved functional abilities    Baseline 25    Time 8    Period Weeks    Status On-going      PT LONG TERM GOAL #3   Title Pt will improve Rt shoulder strength to 3+/5 to progress towards return to work    Time 8    Period Weeks    Status On-going      PT LONG TERM GOAL #4   Title Pt will be able to don a t-shirt independently to demo improved Rt UE ROM    Time 8    Period Weeks    Status Achieved                 Plan - 01/24/21 0846    Clinical Impression Statement Pt with good response to manual therapy this session. Pt educated on rehab precautions about lifting. Pt progressing ROM well    PT Next Visit Plan continue per protocol. Progress per protocol.    PT Home Exercise Plan Access Code: [email or web address]    Consulted and Agree with Plan of Care Patient           Patient will  benefit from skilled therapeutic intervention in order to improve the following deficits and impairments:     Visit Diagnosis: Pain of right upper extremity  Muscle weakness (generalized)  Other symptoms and signs involving the musculoskeletal system  Localized edema     Problem List Patient Active Problem List   Diagnosis Date Noted  . 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 Rotator cuff syndrome, right 02/04/2019  . Primary osteoarthritis of both first carpometacarpal joints 06/03/2017  . BPH (benign prostatic hyperplasia) 04/26/2016  . Arthritis of left knee 11/01/2013  . Arthritis of knee, right 09/08/2013  . Obstructive sleep apnea 07/14/2013  . Left Achilles tendonitis 04/23/2013  . Low back pain 04/23/2013  . Osteoarthritis of both knees 03/19/2013  . Coronary atherosclerosis 09/06/2011  . 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  . Status post coronary artery bypass grafting 08/27/2011  . Hyperlipidemia with target low density lipoprotein (LDL) cholesterol less than 70 mg/dL 01/10/2011  . OBESITY, UNSPECIFIED 01/09/2011  . WEIGHT GAIN 01/09/2011  . BACK STRAIN 10/02/2010  . ADJUSTMENT DISORDER WITH DEPRESSED MOOD 03/31/2009  . HYPERTENSION, BENIGN ESSENTIAL 11/21/2008  . VARICOSE VEINS LOWER EXTREMITIES W/OTH COMPS 08/31/2008  . IMPAIRED FASTING GLUCOSE 08/31/2008  . COLONIC POLYPS 04/20/2008  . GERD 03/16/2008   Vahe Pienta, PT  Jawad Wiacek 01/24/2021, 8:48 AM  [facility] [place], [name], [address] Phone: [phone]   Fax:  [phone]  Name: Bobby White MRN: [id number] Date of Birth: 1960/12/16

## 2021-01-31 ENCOUNTER — Other Ambulatory Visit: Payer: Self-pay

## 2021-01-31 ENCOUNTER — Ambulatory Visit (INDEPENDENT_AMBULATORY_CARE_PROVIDER_SITE_OTHER): Payer: BC Managed Care – PPO | Admitting: Physical Therapy

## 2021-01-31 DIAGNOSIS — M6281 Muscle weakness (generalized): Secondary | ICD-10-CM

## 2021-01-31 DIAGNOSIS — R600 Localized edema: Secondary | ICD-10-CM | POA: Diagnosis not present

## 2021-01-31 DIAGNOSIS — M79601 Pain in right arm: Secondary | ICD-10-CM | POA: Diagnosis not present

## 2021-01-31 DIAGNOSIS — R29898 Other symptoms and signs involving the musculoskeletal system: Secondary | ICD-10-CM

## 2021-01-31 NOTE — Patient Instructions (Signed)
Access Code: [email or web address] URL: [email or web address] Date: 01/31/2021 Prepared by: Isabelle Course  Exercises Shoulder External Rotation with Anchored Resistance - 1 x daily - 7 x weekly - 3 sets - 10 reps Shoulder Internal Rotation with Resistance - 1 x daily - 7 x weekly - 3 sets - 10 reps Standing Bilateral Low Shoulder Row with Anchored Resistance - 1 x daily - 7 x weekly - 3 sets - 10 reps Supine Scapular Protraction in Flexion with Dumbbells - 1 x daily - 7 x weekly - 3 sets - 10 reps

## 2021-01-31 NOTE — Therapy (Signed)
[place], [name], [address] Phone: [phone]   Fax:  [phone]  Physical Therapy Treatment  Patient Details  Name: Bobby White MRN: [id number] Date of Birth: 1961-05-16 Referring Provider (PT): Tania Ade   Encounter Date: 01/31/2021   PT End of Session - 01/31/21 0842    Visit Number 6    Number of Visits 16    Date for PT Re-Evaluation 02/21/21    PT Start Time 0802    PT Stop Time 0848    PT Time Calculation (min) 46 min    Activity Tolerance Patient tolerated treatment well;No increased pain    Behavior During Therapy WFL for tasks assessed/performed           Past Medical History:  Diagnosis Date  . Arthritis   . Coronary artery disease   . GERD (gastroesophageal reflux disease)   . Hepatitis    Hep C --had to get shots  . Hypertension 1/10  . Impaired fasting glucose 2009  . Obesity   . Sleep apnea   . Varicose vein     Past Surgical History:  Procedure Laterality Date  . CARDIAC CATHETERIZATION     2012  . CORONARY ARTERY BYPASS GRAFT  07/25/11   3 vessel  . pins in right hand  1990's   4th and 5th digits-- removed 6 weeks afterwards  . TOTAL KNEE ARTHROPLASTY Right 09/06/2013   Dr Mayer Camel  . TOTAL KNEE ARTHROPLASTY Right 09/06/2013   Procedure: TOTAL KNEE ARTHROPLASTY;  Surgeon: Kerin Salen, MD;  Location: [place];  Service: Orthopedics;  Laterality: Right;  . TOTAL KNEE ARTHROPLASTY Left 11/01/2013   DR Mayer Camel  . TOTAL KNEE ARTHROPLASTY Left 11/01/2013   Procedure: TOTAL KNEE ARTHROPLASTY- LEFT;  Surgeon: Kerin Salen, MD;  Location: [place];  Service: Orthopedics;  Laterality: Left;    There were no vitals filed for this visit.   Subjective Assessment - 01/31/21 0806    Subjective Pt states he still sleeps with his arm above his head and sometimes wakes with dull pain, no other complaints    Patient Stated Goals return to work and recreation activities     Currently in Pain? No/denies                             [facility] Adult PT Treatment/Exercise - 01/31/21 0001      Shoulder Exercises: Supine   Protraction 10 reps;Right;Strengthening    Other Supine Exercises rhythmic stabilization at 90 degrees shoulder flexion 3 x 30 sec      Shoulder Exercises: Standing   External Rotation Strengthening;Right;20 reps    Theraband Level (Shoulder External Rotation) Level 2 (Red)    Internal Rotation Strengthening;Right;20 reps    Theraband Level (Shoulder Internal Rotation) Level 2 (Red)    Flexion Strengthening;Right;20 reps   scaption   ABduction Strengthening;Right;20 reps   to  90     Shoulder Exercises: Pulleys   Flexion 2 minutes    ABduction 2 minutes      Vasopneumatic   Number Minutes Vasopneumatic  10 minutes    Vasopnuematic Location  Shoulder    Vasopneumatic Pressure Low    Vasopneumatic Temperature  34      Manual Therapy   Passive ROM Rt shoulder IR, flexion, abduction                  PT Education - 01/31/21  0841    Education Details updated HEP    Methods Explanation;Demonstration;Handout    Comprehension Verbalized understanding;Returned demonstration               PT Long Term Goals - 01/19/21 0923      PT LONG TERM GOAL #1   Title Pt will be independent with HEP    Time 8    Period Weeks    Status On-going      PT LONG TERM GOAL #2   Title Pt will improve FOTO to >= 72 to demo improved functional abilities    Baseline 25    Time 8    Period Weeks    Status On-going      PT LONG TERM GOAL #3   Title Pt will improve Rt shoulder strength to 3+/5 to progress towards return to work    Time 8    Period Weeks    Status On-going      PT LONG TERM GOAL #4   Title Pt will be able to don a t-shirt independently to demo improved Rt UE ROM    Time 8    Period Weeks    Status Achieved                 Plan - 01/31/21 0844    Clinical Impression Statement Pt with good  response to progression of protocol. Pt challenged by serratus punch and rows with theraband. HEP updated to include strengthening exercises.    PT Next Visit Plan Per protocol.  Add prone exercises    PT Home Exercise Plan Access Code: [email or web address]           Patient will benefit from skilled therapeutic intervention in order to improve the following deficits and impairments:     Visit Diagnosis: Pain of right upper extremity  Muscle weakness (generalized)  Other symptoms and signs involving the musculoskeletal system  Localized edema     Problem List Patient Active Problem List   Diagnosis Date Noted  . 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 Rotator cuff syndrome, right 02/04/2019  . Primary osteoarthritis of both first carpometacarpal joints 06/03/2017  . BPH (benign prostatic hyperplasia) 04/26/2016  . Arthritis of left knee 11/01/2013  . Arthritis of knee, right 09/08/2013  . Obstructive sleep apnea 07/14/2013  . Left Achilles tendonitis 04/23/2013  . Low back pain 04/23/2013  . Osteoarthritis of both knees 03/19/2013  . Coronary atherosclerosis 09/06/2011  . 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  . Status post coronary artery bypass grafting 08/27/2011  . Hyperlipidemia with target low density lipoprotein (LDL) cholesterol less than 70 mg/dL 01/10/2011  . OBESITY, UNSPECIFIED 01/09/2011  . WEIGHT GAIN 01/09/2011  . BACK STRAIN 10/02/2010  . ADJUSTMENT DISORDER WITH DEPRESSED MOOD 03/31/2009  . HYPERTENSION, BENIGN ESSENTIAL 11/21/2008  . VARICOSE VEINS LOWER EXTREMITIES W/OTH COMPS 08/31/2008  . IMPAIRED FASTING GLUCOSE 08/31/2008  . COLONIC POLYPS 04/20/2008  . GERD 03/16/2008   Nishawn Rotan, PT  Avary Eichenberger 01/31/2021, 8:46 AM  [facility] [place], [name], [address] Phone: [phone]   Fax:  [phone]  Name: AUNDREA HIGGINBOTHAM MRN: [id number] Date of Birth:  October 24, 1961

## 2021-02-07 ENCOUNTER — Ambulatory Visit (INDEPENDENT_AMBULATORY_CARE_PROVIDER_SITE_OTHER): Payer: BC Managed Care – PPO | Admitting: Physical Therapy

## 2021-02-07 ENCOUNTER — Other Ambulatory Visit: Payer: Self-pay

## 2021-02-07 DIAGNOSIS — R600 Localized edema: Secondary | ICD-10-CM

## 2021-02-07 DIAGNOSIS — R29898 Other symptoms and signs involving the musculoskeletal system: Secondary | ICD-10-CM

## 2021-02-07 DIAGNOSIS — M6281 Muscle weakness (generalized): Secondary | ICD-10-CM

## 2021-02-07 DIAGNOSIS — M79601 Pain in right arm: Secondary | ICD-10-CM

## 2021-02-07 NOTE — Therapy (Signed)
[place], [name], [address] Phone: [phone]   Fax:  [phone]  Physical Therapy Treatment  Patient Details  Name: Bobby White MRN: [id number] Date of Birth: 1961-08-18 Referring Provider (PT): Tania Ade   Encounter Date: 02/07/2021   PT End of Session - 02/07/21 0837    Visit Number 7    Number of Visits 16    Date for PT Re-Evaluation 02/21/21    PT Start Time 0800    PT Stop Time 0845    PT Time Calculation (min) 45 min    Activity Tolerance Patient tolerated treatment well    Behavior During Therapy [facility] for tasks assessed/performed           Past Medical History:  Diagnosis Date  . Arthritis   . Coronary artery disease   . GERD (gastroesophageal reflux disease)   . Hepatitis    Hep C --had to get shots  . Hypertension 1/10  . Impaired fasting glucose 2009  . Obesity   . Sleep apnea   . Varicose vein     Past Surgical History:  Procedure Laterality Date  . CARDIAC CATHETERIZATION     2012  . CORONARY ARTERY BYPASS GRAFT  07/25/11   3 vessel  . pins in right hand  1990's   4th and 5th digits-- removed 6 weeks afterwards  . TOTAL KNEE ARTHROPLASTY Right 09/06/2013   Dr Mayer Camel  . TOTAL KNEE ARTHROPLASTY Right 09/06/2013   Procedure: TOTAL KNEE ARTHROPLASTY;  Surgeon: Kerin Salen, MD;  Location: [place];  Service: Orthopedics;  Laterality: Right;  . TOTAL KNEE ARTHROPLASTY Left 11/01/2013   DR Mayer Camel  . TOTAL KNEE ARTHROPLASTY Left 11/01/2013   Procedure: TOTAL KNEE ARTHROPLASTY- LEFT;  Surgeon: Kerin Salen, MD;  Location: [place];  Service: Orthopedics;  Laterality: Left;    There were no vitals filed for this visit.   Subjective Assessment - 02/07/21 0801    Subjective Pt states he was a little sore after last session but that it improved after a few days    Patient Stated Goals return to work and recreation activities    Currently in Pain? No/denies                              [facility] Adult PT Treatment/Exercise - 02/07/21 0001      Shoulder Exercises: Supine   Protraction Strengthening;Right;20 reps    Other Supine Exercises rhythmic stabilization 3 x 30 sec      Shoulder Exercises: Prone   Extension Right;20 reps    Horizontal ABduction 1 Right;Strengthening;20 reps      Shoulder Exercises: Standing   External Rotation Strengthening;Right;20 reps    Theraband Level (Shoulder External Rotation) Level 2 (Red)    Internal Rotation Strengthening;Right;20 reps    Theraband Level (Shoulder Internal Rotation) Level 2 (Red)    Flexion Strengthening;Right;20 reps   scaption to 90   ABduction Strengthening;Right;20 reps   to 90     Shoulder Exercises: Pulleys   Flexion 2 minutes    ABduction 2 minutes      Vasopneumatic   Number Minutes Vasopneumatic  10 minutes    Vasopnuematic Location  Shoulder    Vasopneumatic Pressure Low    Vasopneumatic Temperature  34      Manual Therapy   Manual Therapy Joint mobilization    Joint Mobilization inferior and A/P jt mobs grade  2-3    Passive ROM Rt shoulder IR, flexion                       PT Long Term Goals - 01/19/21 6803      PT LONG TERM GOAL #1   Title Pt will be independent with HEP    Time 8    Period Weeks    Status On-going      PT LONG TERM GOAL #2   Title Pt will improve FOTO to >= 72 to demo improved functional abilities    Baseline 25    Time 8    Period Weeks    Status On-going      PT LONG TERM GOAL #3   Title Pt will improve Rt shoulder strength to 3+/5 to progress towards return to work    Time 8    Period Weeks    Status On-going      PT LONG TERM GOAL #4   Title Pt will be able to don a t-shirt independently to demo improved Rt UE ROM    Time 8    Period Weeks    Status Achieved                 Plan - 02/07/21 2122    Clinical Impression Statement Pt able to progress to prone exercises this session with no  increase in pain. Pt continues to require cues to avoid shoulder hiking with standing scaption and abduction    PT Next Visit Plan per protocol. add weights to prone exercises    PT Home Exercise Plan Access Code: [email or web address]    Consulted and Agree with Plan of Care Patient           Patient will benefit from skilled therapeutic intervention in order to improve the following deficits and impairments:     Visit Diagnosis: Pain of right upper extremity  Muscle weakness (generalized)  Other symptoms and signs involving the musculoskeletal system  Localized edema     Problem List Patient Active Problem List   Diagnosis Date Noted  . 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 Rotator cuff syndrome, right 02/04/2019  . Primary osteoarthritis of both first carpometacarpal joints 06/03/2017  . BPH (benign prostatic hyperplasia) 04/26/2016  . Arthritis of left knee 11/01/2013  . Arthritis of knee, right 09/08/2013  . Obstructive sleep apnea 07/14/2013  . Left Achilles tendonitis 04/23/2013  . Low back pain 04/23/2013  . Osteoarthritis of both knees 03/19/2013  . Coronary atherosclerosis 09/06/2011  . 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  . Status post coronary artery bypass grafting 08/27/2011  . Hyperlipidemia with target low density lipoprotein (LDL) cholesterol less than 70 mg/dL 01/10/2011  . OBESITY, UNSPECIFIED 01/09/2011  . WEIGHT GAIN 01/09/2011  . BACK STRAIN 10/02/2010  . ADJUSTMENT DISORDER WITH DEPRESSED MOOD 03/31/2009  . HYPERTENSION, BENIGN ESSENTIAL 11/21/2008  . VARICOSE VEINS LOWER EXTREMITIES W/OTH COMPS 08/31/2008  . IMPAIRED FASTING GLUCOSE 08/31/2008  . COLONIC POLYPS 04/20/2008  . GERD 03/16/2008   Diaz Crago, PT  Brendaly Townsel 02/07/2021, 8:40 AM  [facility] [place], [name], [address] Phone: [phone]   Fax:  [phone]  Name: Bobby White MRN:  [id number] Date of Birth: [birth year]

## 2021-02-14 ENCOUNTER — Other Ambulatory Visit: Payer: Self-pay

## 2021-02-14 ENCOUNTER — Ambulatory Visit (INDEPENDENT_AMBULATORY_CARE_PROVIDER_SITE_OTHER): Payer: BC Managed Care – PPO | Admitting: Physical Therapy

## 2021-02-14 DIAGNOSIS — R600 Localized edema: Secondary | ICD-10-CM | POA: Diagnosis not present

## 2021-02-14 DIAGNOSIS — R29898 Other symptoms and signs involving the musculoskeletal system: Secondary | ICD-10-CM

## 2021-02-14 DIAGNOSIS — M79601 Pain in right arm: Secondary | ICD-10-CM

## 2021-02-14 DIAGNOSIS — M6281 Muscle weakness (generalized): Secondary | ICD-10-CM

## 2021-02-14 NOTE — Therapy (Signed)
[place], [name], [address] Phone: [phone]   Fax:  [phone]  Physical Therapy Treatment  Patient Details  Name: Bobby White MRN: [id number] Date of Birth: Jul 01, 1961 Referring Provider (PT): Tania Ade   Encounter Date: 02/14/2021   PT End of Session - 02/14/21 0837    Visit Number 8    Number of Visits 16    Date for PT Re-Evaluation 02/21/21    PT Start Time 0801    PT Stop Time 0846    PT Time Calculation (min) 45 min    Activity Tolerance Patient tolerated treatment well    Behavior During Therapy [facility] for tasks assessed/performed           Past Medical History:  Diagnosis Date  . Arthritis   . Coronary artery disease   . GERD (gastroesophageal reflux disease)   . Hepatitis    Hep C --had to get shots  . Hypertension 1/10  . Impaired fasting glucose 2009  . Obesity   . Sleep apnea   . Varicose vein     Past Surgical History:  Procedure Laterality Date  . CARDIAC CATHETERIZATION     2012  . CORONARY ARTERY BYPASS GRAFT  07/25/11   3 vessel  . pins in right hand  1990's   4th and 5th digits-- removed 6 weeks afterwards  . TOTAL KNEE ARTHROPLASTY Right 09/06/2013   Dr Mayer Camel  . TOTAL KNEE ARTHROPLASTY Right 09/06/2013   Procedure: TOTAL KNEE ARTHROPLASTY;  Surgeon: Kerin Salen, MD;  Location: [place];  Service: Orthopedics;  Laterality: Right;  . TOTAL KNEE ARTHROPLASTY Left 11/01/2013   DR Mayer Camel  . TOTAL KNEE ARTHROPLASTY Left 11/01/2013   Procedure: TOTAL KNEE ARTHROPLASTY- LEFT;  Surgeon: Kerin Salen, MD;  Location: [place];  Service: Orthopedics;  Laterality: Left;    There were no vitals filed for this visit.   Subjective Assessment - 02/14/21 0806    Subjective Pt states his Left shoulder has been bothering him, right is feeling better    Patient Stated Goals return to work and recreation activities    Currently in Pain? No/denies                              [facility] Adult PT Treatment/Exercise - 02/14/21 0001      Shoulder Exercises: Supine   Protraction Strengthening;Right;20 reps    Protraction Weight (lbs) 2#    Other Supine Exercises rhythmic stabilization 3 x 30 sec      Shoulder Exercises: Prone   Extension Right;20 reps    Extension Weight (lbs) 2#    Horizontal ABduction 1 Strengthening;Right;20 reps      Shoulder Exercises: Standing   Flexion Strengthening;Right;20 reps   scaption   Shoulder Flexion Weight (lbs) 2#    ABduction Strengthening;20 reps   to 90   Shoulder ABduction Weight (lbs) 2#    Other Standing Exercises ball on wall stabilization 3 x 30sec      Shoulder Exercises: Pulleys   Flexion 2 minutes    ABduction 2 minutes      Vasopneumatic   Number Minutes Vasopneumatic  10 minutes    Vasopnuematic Location  Shoulder    Vasopneumatic Pressure Low    Vasopneumatic Temperature  34      Manual Therapy   Passive ROM Rt shoulder IR, flexion  PT Long Term Goals - 01/19/21 5277      PT LONG TERM GOAL #1   Title Pt will be independent with HEP    Time 8    Period Weeks    Status On-going      PT LONG TERM GOAL #2   Title Pt will improve FOTO to >= 72 to demo improved functional abilities    Baseline 25    Time 8    Period Weeks    Status On-going      PT LONG TERM GOAL #3   Title Pt will improve Rt shoulder strength to 3+/5 to progress towards return to work    Time 8    Period Weeks    Status On-going      PT LONG TERM GOAL #4   Title Pt will be able to don a t-shirt independently to demo improved Rt UE ROM    Time 8    Period Weeks    Status Achieved                 Plan - 02/14/21 8242    Clinical Impression Statement Pt able to tolerate addition of weight with all exercises this session. Pt requires cuing to avoid shoulder hiking during abduction. improving ROM in all directions    PT Next Visit Plan NOTE FOR MD,  RECERT    PT Home Exercise Plan Access Code: [email or web address]    Consulted and Agree with Plan of Care Patient           Patient will benefit from skilled therapeutic intervention in order to improve the following deficits and impairments:     Visit Diagnosis: Pain of right upper extremity  Muscle weakness (generalized)  Other symptoms and signs involving the musculoskeletal system  Localized edema     Problem List Patient Active Problem List   Diagnosis Date Noted  . 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 Rotator cuff syndrome, right 02/04/2019  . Primary osteoarthritis of both first carpometacarpal joints 06/03/2017  . BPH (benign prostatic hyperplasia) 04/26/2016  . Arthritis of left knee 11/01/2013  . Arthritis of knee, right 09/08/2013  . Obstructive sleep apnea 07/14/2013  . Left Achilles tendonitis 04/23/2013  . Low back pain 04/23/2013  . Osteoarthritis of both knees 03/19/2013  . Coronary atherosclerosis 09/06/2011  . 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  . Status post coronary artery bypass grafting 08/27/2011  . Hyperlipidemia with target low density lipoprotein (LDL) cholesterol less than 70 mg/dL 01/10/2011  . OBESITY, UNSPECIFIED 01/09/2011  . WEIGHT GAIN 01/09/2011  . BACK STRAIN 10/02/2010  . ADJUSTMENT DISORDER WITH DEPRESSED MOOD 03/31/2009  . HYPERTENSION, BENIGN ESSENTIAL 11/21/2008  . VARICOSE VEINS LOWER EXTREMITIES W/OTH COMPS 08/31/2008  . IMPAIRED FASTING GLUCOSE 08/31/2008  . COLONIC POLYPS 04/20/2008  . GERD 03/16/2008   Bobby White, PT  Bobby White 02/14/2021, 8:39 AM  [facility] [place], [name], [address] Phone: [phone]   Fax:  [phone]  Name: Bobby White MRN: [id number] Date of Birth: [birth year]

## 2021-02-21 ENCOUNTER — Other Ambulatory Visit: Payer: Self-pay

## 2021-02-21 ENCOUNTER — Ambulatory Visit (INDEPENDENT_AMBULATORY_CARE_PROVIDER_SITE_OTHER): Payer: BC Managed Care – PPO | Admitting: Rehabilitative and Restorative Service Providers"

## 2021-02-21 ENCOUNTER — Encounter: Payer: Self-pay | Admitting: Rehabilitative and Restorative Service Providers"

## 2021-02-21 DIAGNOSIS — M79601 Pain in right arm: Secondary | ICD-10-CM | POA: Diagnosis not present

## 2021-02-21 DIAGNOSIS — M6281 Muscle weakness (generalized): Secondary | ICD-10-CM

## 2021-02-21 DIAGNOSIS — R29898 Other symptoms and signs involving the musculoskeletal system: Secondary | ICD-10-CM | POA: Diagnosis not present

## 2021-02-21 DIAGNOSIS — R600 Localized edema: Secondary | ICD-10-CM | POA: Diagnosis not present

## 2021-02-21 NOTE — Therapy (Signed)
[place], [name], [address] Phone: [phone]   Fax:  [phone]  Physical Therapy Treatment  Patient Details  Name: DATRON BRAKEBILL MRN: [id number] Date of Birth: [birth year] Referring Provider (PT): Tania Ade   Encounter Date: 02/21/2021   PT End of Session - 02/21/21 0929    Visit Number 9    Number of Visits 16    Date for PT Re-Evaluation 02/21/21    PT Start Time 0848    PT Stop Time 0933    PT Time Calculation (min) 45 min    Activity Tolerance Patient tolerated treatment well    Behavior During Therapy [facility] for tasks assessed/performed           Past Medical History:  Diagnosis Date  . Arthritis   . Coronary artery disease   . GERD (gastroesophageal reflux disease)   . Hepatitis    Hep C --had to get shots  . Hypertension 1/10  . Impaired fasting glucose 2009  . Obesity   . Sleep apnea   . Varicose vein     Past Surgical History:  Procedure Laterality Date  . CARDIAC CATHETERIZATION     2012  . CORONARY ARTERY BYPASS GRAFT  07/25/11   3 vessel  . pins in right hand  1990's   4th and 5th digits-- removed 6 weeks afterwards  . TOTAL KNEE ARTHROPLASTY Right 09/06/2013   Dr Mayer Camel  . TOTAL KNEE ARTHROPLASTY Right 09/06/2013   Procedure: TOTAL KNEE ARTHROPLASTY;  Surgeon: Kerin Salen, MD;  Location: [place];  Service: Orthopedics;  Laterality: Right;  . TOTAL KNEE ARTHROPLASTY Left 11/01/2013   DR Mayer Camel  . TOTAL KNEE ARTHROPLASTY Left 11/01/2013   Procedure: TOTAL KNEE ARTHROPLASTY- LEFT;  Surgeon: Kerin Salen, MD;  Location: [place];  Service: Orthopedics;  Laterality: Left;    There were no vitals filed for this visit.   Subjective Assessment - 02/21/21 0854    Subjective The patient arrived this morning and was able to be worked into the schedule.  He notes some tightness in the shoulder after working on his son's jeep over the weekend.    Diagnostic tests x ray  shows arthritis, MRI showed rotator cuff tear    Patient Stated Goals return to work and recreation activities    Currently in Pain? No/denies              [facility] PT Assessment - 02/21/21 0856      Assessment   Medical Diagnosis Rt rotator cuff repair    Referring Provider (PT) Tania Ade    Onset Date/Surgical Date 12/05/20    Hand Dominance Right      ROM / Strength   AROM / PROM / Strength AROM      AROM   Overall AROM Comments 150 degrees of flexion supine, standing gets scapular elevation with scaption (full can), and abduction *use mirror for cues    AROM Assessment Site Shoulder    Right/Left Shoulder Right    Right Shoulder Flexion 150 Degrees                         [facility] Adult PT Treatment/Exercise - 02/21/21 0857      Exercises   Exercises Elbow;Shoulder      Shoulder Exercises: Supine   Flexion AAROM;20 reps    Flexion Limitations working on end range motion      Shoulder Exercises: Prone  Retraction Strengthening;Both;10 reps    Extension Right;20 reps    Extension Weight (lbs) 2#    Horizontal ABduction 1 Strengthening;Right;20 reps    Other Prone Exercises prone row without weights x 12 reps-- pain on L side      Shoulder Exercises: Sidelying   External Rotation Strengthening;Right;10 reps    External Rotation Weight (lbs) 2#    ABduction AROM;Right;10 reps    ABduction Limitations overpressure into abduction with stretch    Other Sidelying Exercises sidelying diagonal AROM shoulder extension to flexion      Shoulder Exercises: Standing   Flexion Strengthening;Right;10 reps    Shoulder Flexion Weight (lbs) 2#    Flexion Limitations cues to avoid scapular elevation    ABduction Strengthening;Right;10 reps    ABduction Limitations bent arm abduction focusing on scapular stabiity and not hiking shoulder    Other Standing Exercises ball on wall stabilization 3 x 30sec      Shoulder Exercises: ROM/Strengthening   UBE (Upper Arm  Bike) *had patient begin to warm up on UBE -- due to 12 weeks post-- he had arrived at unscheduled session and was a work in.  As PT reviewed chart, noted that "avoid UBE" was in protocol.  Had done level 2 x 66minutes forward before stopping.    Ball on Wall CW and CCW x 40 seconds each direction R UE      Shoulder Exercises: Isometric Strengthening   ABduction 5X5"      Shoulder Exercises: Stretch   Corner Stretch Limitations door frame stretch      Vasopneumatic   Number Minutes Vasopneumatic  10 minutes    Vasopnuematic Location  Shoulder    Vasopneumatic Pressure Low    Vasopneumatic Temperature  34      Manual Therapy   Manual Therapy Joint mobilization;Soft tissue mobilization    Joint Mobilization inferior GH grade II-III, gentle distraction for relaxation    Passive ROM into abduction in sidelying, IR in sidelying with pec release                       PT Long Term Goals - 02/21/21 1226      PT LONG TERM GOAL #1   Title Pt will be independent with HEP    Time 8    Period Weeks    Status On-going      PT LONG TERM GOAL #2   Title Pt will improve FOTO to >= 72 to demo improved functional abilities    Baseline 25%    Time 8    Period Weeks    Status On-going      PT LONG TERM GOAL #3   Title Pt will improve Rt shoulder strength to 3+/5 to progress towards return to work    Time 8    Period Weeks    Status On-going      PT LONG TERM GOAL #4   Title Pt will be able to don a t-shirt independently to demo improved Rt UE ROM    Time 8    Period Weeks    Status Achieved                 Plan - 02/21/21 1230    Clinical Impression Statement The patient arrived to therapy today noting tightness after working on his son's jeep over the weekend.  He has improving ROM with flexion.  Against gravity, he demonstrates a compensatory shoulder shrug with full can scaption and abduction--  PT working to provide cues for scapular stability.  Continue working  to [employer]-- plan to update next visit.    PT Treatment/Interventions ADLs/Self Care Home Management;Cryotherapy;Electrical Stimulation;Moist Heat;Iontophoresis 4mg /ml Dexamethasone;Therapeutic activities;Therapeutic exercise;Neuromuscular re-education;Vasopneumatic Device;Manual techniques;Taping;Patient/family education;Passive range of motion;Dry needling    PT Next Visit Plan Sent note ot MD for visit-- still needs RECERT    PT Home Exercise Plan Access Code: [email or web address]    Consulted and Agree with Plan of Care Patient           Patient will benefit from skilled therapeutic intervention in order to improve the following deficits and impairments:     Visit Diagnosis: Pain of right upper extremity  Muscle weakness (generalized)  Other symptoms and signs involving the musculoskeletal system  Localized edema     Problem List Patient Active Problem List   Diagnosis Date Noted  . 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 Rotator cuff syndrome, right 02/04/2019  . Primary osteoarthritis of both first carpometacarpal joints 06/03/2017  . BPH (benign prostatic hyperplasia) 04/26/2016  . Arthritis of left knee 11/01/2013  . Arthritis of knee, right 09/08/2013  . Obstructive sleep apnea 07/14/2013  . Left Achilles tendonitis 04/23/2013  . Low back pain 04/23/2013  . Osteoarthritis of both knees 03/19/2013  . Coronary atherosclerosis 09/06/2011  . 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  . Status post coronary artery bypass grafting 08/27/2011  . Hyperlipidemia with target low density lipoprotein (LDL) cholesterol less than 70 mg/dL 01/10/2011  . OBESITY, UNSPECIFIED 01/09/2011  . WEIGHT GAIN 01/09/2011  . BACK STRAIN 10/02/2010  . ADJUSTMENT DISORDER WITH DEPRESSED MOOD 03/31/2009  . HYPERTENSION, BENIGN ESSENTIAL 11/21/2008  . VARICOSE VEINS LOWER EXTREMITIES W/OTH COMPS 08/31/2008  . IMPAIRED FASTING GLUCOSE 08/31/2008  . COLONIC POLYPS 04/20/2008  . GERD 03/16/2008     [place], PT 02/21/2021, 12:33 PM  [facility] [place], [name], [address] Phone: [phone]   Fax:  [phone]  Name: SABA NEUMAN MRN: [id number] Date of Birth: September 01, 1961

## 2021-02-28 ENCOUNTER — Other Ambulatory Visit: Payer: Self-pay

## 2021-02-28 ENCOUNTER — Ambulatory Visit (INDEPENDENT_AMBULATORY_CARE_PROVIDER_SITE_OTHER): Payer: BC Managed Care – PPO | Admitting: Rehabilitative and Restorative Service Providers"

## 2021-02-28 ENCOUNTER — Encounter: Payer: Self-pay | Admitting: Rehabilitative and Restorative Service Providers"

## 2021-02-28 DIAGNOSIS — M6281 Muscle weakness (generalized): Secondary | ICD-10-CM

## 2021-02-28 DIAGNOSIS — G5630 Lesion of radial nerve, unspecified upper limb: Secondary | ICD-10-CM

## 2021-02-28 DIAGNOSIS — R600 Localized edema: Secondary | ICD-10-CM | POA: Diagnosis not present

## 2021-02-28 DIAGNOSIS — M79601 Pain in right arm: Secondary | ICD-10-CM | POA: Diagnosis not present

## 2021-02-28 DIAGNOSIS — R29898 Other symptoms and signs involving the musculoskeletal system: Secondary | ICD-10-CM

## 2021-02-28 NOTE — Therapy (Signed)
[place], [name], [address] Phone: [phone]   Fax:  [phone]  Physical Therapy Treatment  Patient Details  Name: Bobby White MRN: [id number] Date of Birth: Mar 01, 1961 Referring Provider (PT): Tania Ade   Encounter Date: 02/28/2021   PT End of Session - 02/28/21 0808    Visit Number 10    Number of Visits 22    Date for PT Re-Evaluation 04/11/21    PT Start Time 0800    PT Stop Time 0849    PT Time Calculation (min) 49 min    Activity Tolerance Patient tolerated treatment well           Past Medical History:  Diagnosis Date  . Arthritis   . Coronary artery disease   . GERD (gastroesophageal reflux disease)   . Hepatitis    Hep C --had to get shots  . Hypertension 1/10  . Impaired fasting glucose 2009  . Obesity   . Sleep apnea   . Varicose vein     Past Surgical History:  Procedure Laterality Date  . CARDIAC CATHETERIZATION     2012  . CORONARY ARTERY BYPASS GRAFT  07/25/11   3 vessel  . pins in right hand  1990's   4th and 5th digits-- removed 6 weeks afterwards  . TOTAL KNEE ARTHROPLASTY Right 09/06/2013   Dr Mayer Camel  . TOTAL KNEE ARTHROPLASTY Right 09/06/2013   Procedure: TOTAL KNEE ARTHROPLASTY;  Surgeon: Kerin Salen, MD;  Location: [place];  Service: Orthopedics;  Laterality: Right;  . TOTAL KNEE ARTHROPLASTY Left 11/01/2013   DR Mayer Camel  . TOTAL KNEE ARTHROPLASTY Left 11/01/2013   Procedure: TOTAL KNEE ARTHROPLASTY- LEFT;  Surgeon: Kerin Salen, MD;  Location: [place];  Service: Orthopedics;  Laterality: Left;    There were no vitals filed for this visit.   Subjective Assessment - 02/28/21 0809    Subjective Patient reports that he saw the MD Monday and he increased lifting limit to 10# and will continue with therapy. Has been working on his son's jeep and is sore from that.    Currently in Pain? Yes    Pain Score 1     Pain Location Shoulder    Pain  Orientation Right    Pain Descriptors / Indicators Aching    Pain Type Chronic pain              [facility] PT Assessment - 02/28/21 0001      Assessment   Medical Diagnosis Rt rotator cuff repair    Referring Provider (PT) Tania Ade    Onset Date/Surgical Date 12/05/20    Hand Dominance Right      AROM   Right Shoulder Flexion 150 Degrees      PROM   Right Shoulder Flexion 148 Degrees    Right Shoulder ABduction 143 Degrees   in scapular plane   Right Shoulder External Rotation 84 Degrees                         [facility] Adult PT Treatment/Exercise - 02/28/21 0001      Shoulder Exercises: Seated   Other Seated Exercises scapular deprression pressing into small ball at side 3 sec hold x 10 reps - focus on scapular control      Shoulder Exercises: Standing   Retraction Strengthening;Both;20 reps;Theraband    Theraband Level (Shoulder Retraction) Level 2 (Red)      Shoulder Exercises:  Pulleys   Flexion Limitations 10 sec hold x 10 reps focus on scapular control    Scaption Limitations 10 sec hold x 10 reps focus on scapular control      Shoulder Exercises: Therapy Ball   Flexion Right;10 reps    Flexion Limitations VC for scapular control 3 sec hold x 3 reps pressing ball as increased height    Other Therapy Ball Exercises bouncing small ball on wall 30-40 reps      Shoulder Exercises: ROM/Strengthening   Ball on Wall CW and CCW x 40 seconds each direction R UE      Vasopneumatic   Number Minutes Vasopneumatic  10 minutes    Vasopnuematic Location  Shoulder    Vasopneumatic Pressure Low    Vasopneumatic Temperature  34      Manual Therapy   Manual Therapy Joint mobilization;Soft tissue mobilization    Manual therapy comments pt supine    Joint Mobilization inferior GH grade II-III, gentle distraction for relaxation    Soft tissue mobilization pecs; upper trap    Passive ROM supine flexioin; abduction IR ER in scapular plane                        PT Long Term Goals - 02/28/21 0822      PT LONG TERM GOAL #1   Title Pt will be independent with HEP    Baseline -    Time 6    Status Revised    Target Date 04/12/21      PT LONG TERM GOAL #2   Title Pt will improve FOTO to >= 72 to demo improved functional abilities    Baseline -    Time 6    Period Weeks    Status Revised    Target Date 04/11/21      PT LONG TERM GOAL #3   Title Pt will improve Rt shoulder strength to 4/5 to progress towards return to work    Baseline -    Time 6    Period Weeks    Status Revised    Target Date 04/11/21      PT LONG TERM GOAL #4   Title Improve tolerance for work related tasks    Baseline -    Time 6    Period Weeks    Status New    Target Date 04/11/21      PT LONG TERM GOAL #5   Title Improve FOTO to </= 38% limitation    Baseline -    Time 6    Period Weeks    Status Partially Met    Target Date 04/11/21                 Plan - 02/28/21 0818    Clinical Impression Statement MD pleased with progress. Increased wt restirctions to 10# lifting. Patient continues to work on his son's jeep and has some soreness from that. Added strengthening and scapular stabilization exercises today focus on scapular control.    Rehab Potential Good    PT Frequency 2x / week    PT Duration 8 weeks    PT Treatment/Interventions ADLs/Self Care Home Management;Cryotherapy;Electrical Stimulation;Moist Heat;Iontophoresis 53m/ml Dexamethasone;Therapeutic activities;Therapeutic exercise;Neuromuscular re-education;Vasopneumatic Device;Manual techniques;Taping;Patient/family education;Passive range of motion;Dry needling    PT Next Visit Plan continue with strengthening and scapular stabilization    PT Home Exercise Plan [email or web address]    Consulted and Agree with Plan of Care Patient  Patient will benefit from skilled therapeutic intervention in order to improve the following deficits and impairments:     Visit  Diagnosis: Pain of right upper extremity  Muscle weakness (generalized)  Other symptoms and signs involving the musculoskeletal system  Localized edema  Wartenberg syndrome     Problem List Patient Active Problem List   Diagnosis Date Noted  . 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 Rotator cuff syndrome, right 02/04/2019  . Primary osteoarthritis of both first carpometacarpal joints 06/03/2017  . BPH (benign prostatic hyperplasia) 04/26/2016  . Arthritis of left knee 11/01/2013  . Arthritis of knee, right 09/08/2013  . Obstructive sleep apnea 07/14/2013  . Left Achilles tendonitis 04/23/2013  . Low back pain 04/23/2013  . Osteoarthritis of both knees 03/19/2013  . Coronary atherosclerosis 09/06/2011  . 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  . Status post coronary artery bypass grafting 08/27/2011  . Hyperlipidemia with target low density lipoprotein (LDL) cholesterol less than 70 mg/dL 01/10/2011  . OBESITY, UNSPECIFIED 01/09/2011  . WEIGHT GAIN 01/09/2011  . BACK STRAIN 10/02/2010  . ADJUSTMENT DISORDER WITH DEPRESSED MOOD 03/31/2009  . HYPERTENSION, BENIGN ESSENTIAL 11/21/2008  . VARICOSE VEINS LOWER EXTREMITIES W/OTH COMPS 08/31/2008  . IMPAIRED FASTING GLUCOSE 08/31/2008  . COLONIC POLYPS 04/20/2008  . GERD 03/16/2008    Bular Hickok Nilda Simmer PT, MPH  02/28/2021, 8:45 AM  [facility] [place], [name], [address] Phone: [phone]   Fax:  [phone]  Name: Bobby White MRN: [id number] Date of Birth: [birth year]

## 2021-03-07 ENCOUNTER — Other Ambulatory Visit: Payer: Self-pay

## 2021-03-07 ENCOUNTER — Ambulatory Visit (INDEPENDENT_AMBULATORY_CARE_PROVIDER_SITE_OTHER): Payer: BC Managed Care – PPO | Admitting: Physical Therapy

## 2021-03-07 DIAGNOSIS — M79601 Pain in right arm: Secondary | ICD-10-CM | POA: Diagnosis not present

## 2021-03-07 DIAGNOSIS — R600 Localized edema: Secondary | ICD-10-CM | POA: Diagnosis not present

## 2021-03-07 DIAGNOSIS — M6281 Muscle weakness (generalized): Secondary | ICD-10-CM | POA: Diagnosis not present

## 2021-03-07 DIAGNOSIS — R29898 Other symptoms and signs involving the musculoskeletal system: Secondary | ICD-10-CM

## 2021-03-07 NOTE — Therapy (Signed)
[place], [name], [address] Phone: [phone]   Fax:  [phone]  Physical Therapy Treatment  Patient Details  Name: Bobby White MRN: [id number] Date of Birth: 1961-09-23 Referring Provider (PT): Tania Ade   Encounter Date: 03/07/2021   PT End of Session - 03/07/21 0847    Visit Number 11    Number of Visits 22    Date for PT Re-Evaluation 04/11/21    PT Start Time 0800    PT Stop Time 0850    PT Time Calculation (min) 50 min    Activity Tolerance Patient tolerated treatment well    Behavior During Therapy [facility] for tasks assessed/performed           Past Medical History:  Diagnosis Date  . Arthritis   . Coronary artery disease   . GERD (gastroesophageal reflux disease)   . Hepatitis    Hep C --had to get shots  . Hypertension 1/10  . Impaired fasting glucose 2009  . Obesity   . Sleep apnea   . Varicose vein     Past Surgical History:  Procedure Laterality Date  . CARDIAC CATHETERIZATION     2012  . CORONARY ARTERY BYPASS GRAFT  07/25/11   3 vessel  . pins in right hand  1990's   4th and 5th digits-- removed 6 weeks afterwards  . TOTAL KNEE ARTHROPLASTY Right 09/06/2013   Dr Mayer Camel  . TOTAL KNEE ARTHROPLASTY Right 09/06/2013   Procedure: TOTAL KNEE ARTHROPLASTY;  Surgeon: Kerin Salen, MD;  Location: [place];  Service: Orthopedics;  Laterality: Right;  . TOTAL KNEE ARTHROPLASTY Left 11/01/2013   DR Mayer Camel  . TOTAL KNEE ARTHROPLASTY Left 11/01/2013   Procedure: TOTAL KNEE ARTHROPLASTY- LEFT;  Surgeon: Kerin Salen, MD;  Location: [place];  Service: Orthopedics;  Laterality: Left;    There were no vitals filed for this visit.   Subjective Assessment - 03/07/21 0802    Subjective Pt states that he plans to return to work next week    Patient Stated Goals return to work and recreation activities    Currently in Pain? No/denies                              [facility] Adult PT Treatment/Exercise - 03/07/21 0001      Shoulder Exercises: Standing   Extension Both;20 reps    Theraband Level (Shoulder Extension) Level 3 (Green)    Retraction Strengthening;20 reps    Theraband Level (Shoulder Retraction) Level 3 (Green)    Other Standing Exercises scaption with 1 UE holding steady and other UE moving x10 bilat 2#    Other Standing Exercises holding 2# at 90/90 while walking x 2 laps      Shoulder Exercises: Therapy Ball   Other Therapy Ball Exercises bouncing tennis ball on wall at 90 degrees flexion and then 90 degrees abduction 2 x 30 sec each      Shoulder Exercises: ROM/Strengthening   UBE (Upper Arm Bike) level 2 x 4 min alt fwd/bkwd    Ball on Wall CW/CCW x 30 each    Other ROM/Strengthening Exercises standing rhythmic stabilization ball on wall 3 x 30 sec      Vasopneumatic   Number Minutes Vasopneumatic  10 minutes    Vasopnuematic Location  Shoulder    Vasopneumatic Pressure Low    Vasopneumatic Temperature  34  Manual Therapy   Joint Mobilization inferior and A/P mobs grade 2-3    Soft tissue mobilization deltoids anterior and medial                       PT Long Term Goals - 02/28/21 0822      PT LONG TERM GOAL #1   Title Pt will be independent with HEP    Baseline -    Time 6    Status Revised    Target Date 04/12/21      PT LONG TERM GOAL #2   Title Pt will improve FOTO to >= 72 to demo improved functional abilities    Baseline -    Time 6    Period Weeks    Status Revised    Target Date 04/11/21      PT LONG TERM GOAL #3   Title Pt will improve Rt shoulder strength to 4/5 to progress towards return to work    Baseline -    Time 6    Period Weeks    Status Revised    Target Date 04/11/21      PT LONG TERM GOAL #4   Title Improve tolerance for work related tasks    Baseline -    Time 6    Period Weeks    Status New    Target Date 04/11/21      PT  LONG TERM GOAL #5   Title Improve FOTO to </= 38% limitation    Baseline -    Time 6    Period Weeks    Status Partially Met    Target Date 04/11/21                 Plan - 03/07/21 0847    Clinical Impression Statement Pt states he is ready to return to work next week. Session incorporated more static holding exercises to increase mm endurance. pt fatigues quickly but is making good progress in strength and ROM    PT Next Visit Plan work readiness    PT [place] and Agree with Plan of Care Patient           Patient will benefit from skilled therapeutic intervention in order to improve the following deficits and impairments:     Visit Diagnosis: Pain of right upper extremity  Muscle weakness (generalized)  Other symptoms and signs involving the musculoskeletal system  Localized edema     Problem List Patient Active Problem List   Diagnosis Date Noted  . 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 Rotator cuff syndrome, right 02/04/2019  . Primary osteoarthritis of both first carpometacarpal joints 06/03/2017  . BPH (benign prostatic hyperplasia) 04/26/2016  . Arthritis of left knee 11/01/2013  . Arthritis of knee, right 09/08/2013  . Obstructive sleep apnea 07/14/2013  . Left Achilles tendonitis 04/23/2013  . Low back pain 04/23/2013  . Osteoarthritis of both knees 03/19/2013  . Coronary atherosclerosis 09/06/2011  . 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  . Status post coronary artery bypass grafting 08/27/2011  . Hyperlipidemia with target low density lipoprotein (LDL) cholesterol less than 70 mg/dL 01/10/2011  . OBESITY, UNSPECIFIED 01/09/2011  . WEIGHT GAIN 01/09/2011  . BACK STRAIN 10/02/2010  . ADJUSTMENT DISORDER WITH DEPRESSED MOOD 03/31/2009  . HYPERTENSION, BENIGN ESSENTIAL 11/21/2008  . VARICOSE VEINS LOWER EXTREMITIES W/OTH COMPS 08/31/2008  . IMPAIRED FASTING GLUCOSE 08/31/2008  . COLONIC POLYPS  04/20/2008  . GERD  03/16/2008   Ashwath Lasch, PT  [place] 03/07/2021, 8:49 AM  [facility] [place], [name], [address] Phone: [phone]   Fax:  [phone]  Name: Bobby White MRN: [id number] Date of Birth: 02-09-1961

## 2021-03-14 ENCOUNTER — Ambulatory Visit (INDEPENDENT_AMBULATORY_CARE_PROVIDER_SITE_OTHER): Payer: BC Managed Care – PPO | Admitting: Physical Therapy

## 2021-03-14 ENCOUNTER — Other Ambulatory Visit: Payer: Self-pay

## 2021-03-14 DIAGNOSIS — R29898 Other symptoms and signs involving the musculoskeletal system: Secondary | ICD-10-CM

## 2021-03-14 DIAGNOSIS — R600 Localized edema: Secondary | ICD-10-CM | POA: Diagnosis not present

## 2021-03-14 DIAGNOSIS — M6281 Muscle weakness (generalized): Secondary | ICD-10-CM

## 2021-03-14 DIAGNOSIS — M79601 Pain in right arm: Secondary | ICD-10-CM

## 2021-03-14 NOTE — Therapy (Signed)
Bobby White  [place], [name], [address] Phone: [phone]   Fax:  [phone]  Physical Therapy Treatment  Patient Details  Name: Bobby White MRN: [id number] Date of Birth: 1961/01/21 Referring Provider (PT): Tania Ade   Encounter Date: 03/14/2021   PT End of Session - 03/14/21 0923    Visit Number 12    Number of Visits 22    Date for PT Re-Evaluation 04/11/21    PT Start Time 0845    PT Stop Time 0935    PT Time Calculation (min) 50 min    Activity Tolerance Patient tolerated treatment well    Behavior During Therapy [name] for tasks assessed/performed           Past Medical History:  Diagnosis Date  . Arthritis   . Coronary artery disease   . GERD (gastroesophageal reflux disease)   . Hepatitis    Hep C --had to get shots  . Hypertension 1/10  . Impaired fasting glucose 2009  . Obesity   . Sleep apnea   . Varicose vein     Past Surgical History:  Procedure Laterality Date  . CARDIAC CATHETERIZATION     2012  . CORONARY ARTERY BYPASS GRAFT  07/25/11   3 vessel  . pins in right hand  1990's   4th and 5th digits-- removed 6 weeks afterwards  . TOTAL KNEE ARTHROPLASTY Right 09/06/2013   Dr Mayer Camel  . TOTAL KNEE ARTHROPLASTY Right 09/06/2013   Procedure: TOTAL KNEE ARTHROPLASTY;  Surgeon: Kerin Salen, MD;  Location: [place];  Service: Orthopedics;  Laterality: Right;  . TOTAL KNEE ARTHROPLASTY Left 11/01/2013   DR Mayer Camel  . TOTAL KNEE ARTHROPLASTY Left 11/01/2013   Procedure: TOTAL KNEE ARTHROPLASTY- LEFT;  Surgeon: Kerin Salen, MD;  Location: [place];  Service: Orthopedics;  Laterality: Left;    There were no vitals filed for this visit.   Subjective Assessment - 03/14/21 0847    Subjective Pt states his work has delayed his return. 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 Yes    Pain Score 2     Pain  Location Shoulder    Pain Orientation Right    Pain Descriptors / Indicators Sore              [facility] PT Assessment - 03/14/21 0001      Observation/Other Assessments   Focus on Therapeutic Outcomes (FOTO)  62 functional status measure                         [facility] Adult PT Treatment/Exercise - 03/14/21 0001      Shoulder Exercises: Standing   Extension Both;20 reps    Theraband Level (Shoulder Extension) Level 3 (Green)    Retraction Both    Theraband Level (Shoulder Retraction) Level 3 (Green)    Other Standing Exercises scaption 1 UE holding and the other moving x 10 bilat 3#    Other Standing Exercises holding 3# at 90/90 x 2 laps      Shoulder Exercises: ROM/Strengthening   UBE (Upper Arm Bike) level 3 x 4 min alt fwd/bkwd    Ball on Wall CW/CCW x 30 each    Other ROM/Strengthening Exercises standing rhythmic stabilization ball on wall 3 x 30 sec      Vasopneumatic   Number Minutes Vasopneumatic  10  minutes    Vasopnuematic Location  Shoulder    Vasopneumatic Pressure Low    Vasopneumatic Temperature  34      Manual Therapy   Joint Mobilization inferior and A/P glides    Soft tissue mobilization deltoids anterior and medial                       PT Long Term Goals - 03/14/21 0924      PT LONG TERM GOAL #1   Title Pt will be independent with HEP    Status On-going      PT LONG TERM GOAL #2   Title Pt will improve FOTO to >= 72 to demo improved functional abilities    Baseline 62 on 03/14/21    Status On-going      PT LONG TERM GOAL #3   Title Pt will improve Rt shoulder strength to 4/5 to progress towards return to work    Baseline 3+    Status On-going      PT LONG TERM GOAL #4   Title Improve tolerance for work related tasks    Status On-going                 Plan - 03/14/21 0923    Clinical Impression Statement Pt states his work told him to stay out another month. Pt states his plan is now to return 04/04/21. Pt  fatigues quickly with mm endurance tasks but is progressing well with strength and ROM. FOTO improved to 33    PT Next Visit Plan work readiness    PT [place] and Agree with Plan of Care Patient           Patient will benefit from skilled therapeutic intervention in order to improve the following deficits and impairments:     Visit Diagnosis: Pain of right upper extremity  Muscle weakness (generalized)  Other symptoms and signs involving the musculoskeletal system  Localized edema     Problem List Patient Active Problem List   Diagnosis Date Noted  . 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 Rotator cuff syndrome, right 02/04/2019  . Primary osteoarthritis of both first carpometacarpal joints 06/03/2017  . BPH (benign prostatic hyperplasia) 04/26/2016  . Arthritis of left knee 11/01/2013  . Arthritis of knee, right 09/08/2013  . Obstructive sleep apnea 07/14/2013  . Left Achilles tendonitis 04/23/2013  . Low back pain 04/23/2013  . Osteoarthritis of both knees 03/19/2013  . Coronary atherosclerosis 09/06/2011  . 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  . Status post coronary artery bypass grafting 08/27/2011  . Hyperlipidemia with target low density lipoprotein (LDL) cholesterol less than 70 mg/dL 01/10/2011  . OBESITY, UNSPECIFIED 01/09/2011  . WEIGHT GAIN 01/09/2011  . BACK STRAIN 10/02/2010  . ADJUSTMENT DISORDER WITH DEPRESSED MOOD 03/31/2009  . HYPERTENSION, BENIGN ESSENTIAL 11/21/2008  . VARICOSE VEINS LOWER EXTREMITIES W/OTH COMPS 08/31/2008  . IMPAIRED FASTING GLUCOSE 08/31/2008  . COLONIC POLYPS 04/20/2008  . GERD 03/16/2008   Kynnedy Carreno, PT  Franchon Ketterman 03/14/2021, 9:26 AM  [facility] [place], [name], [address] Phone: [phone]   Fax:  [phone]  Name: Bobby White MRN: [id number] Date of Birth: 1961/06/19

## 2021-03-20 DIAGNOSIS — E291 Testicular hypofunction: Secondary | ICD-10-CM | POA: Diagnosis not present

## 2021-03-20 DIAGNOSIS — R5383 Other fatigue: Secondary | ICD-10-CM | POA: Diagnosis not present

## 2021-03-20 DIAGNOSIS — Z79890 Hormone replacement therapy (postmenopausal): Secondary | ICD-10-CM | POA: Diagnosis not present

## 2021-03-21 ENCOUNTER — Other Ambulatory Visit: Payer: Self-pay

## 2021-03-21 ENCOUNTER — Ambulatory Visit (INDEPENDENT_AMBULATORY_CARE_PROVIDER_SITE_OTHER): Payer: BC Managed Care – PPO | Admitting: Physical Therapy

## 2021-03-21 DIAGNOSIS — R600 Localized edema: Secondary | ICD-10-CM | POA: Diagnosis not present

## 2021-03-21 DIAGNOSIS — M6281 Muscle weakness (generalized): Secondary | ICD-10-CM | POA: Diagnosis not present

## 2021-03-21 DIAGNOSIS — R29898 Other symptoms and signs involving the musculoskeletal system: Secondary | ICD-10-CM | POA: Diagnosis not present

## 2021-03-21 DIAGNOSIS — M79601 Pain in right arm: Secondary | ICD-10-CM | POA: Diagnosis not present

## 2021-03-21 NOTE — Therapy (Signed)
[place], [name], [address] Phone: [phone]   Fax:  [phone]  Physical Therapy Treatment  Patient Details  Name: Bobby White MRN: [id number] Date of Birth: [birth year] Referring Provider (PT): Tania Ade   Encounter Date: 03/21/2021   PT End of Session - 03/21/21 0838    Visit Number 13    Number of Visits 22    Date for PT Re-Evaluation 04/11/21    PT Start Time 0802    PT Stop Time 5093    PT Time Calculation (min) 45 min    Activity Tolerance Patient tolerated treatment well    Behavior During Therapy [facility] for tasks assessed/performed           Past Medical History:  Diagnosis Date  . Arthritis   . Coronary artery disease   . GERD (gastroesophageal reflux disease)   . Hepatitis    Hep C --had to get shots  . Hypertension 1/10  . Impaired fasting glucose 2009  . Obesity   . Sleep apnea   . Varicose vein     Past Surgical History:  Procedure Laterality Date  . CARDIAC CATHETERIZATION     2012  . CORONARY ARTERY BYPASS GRAFT  07/25/11   3 vessel  . pins in right hand  1990's   4th and 5th digits-- removed 6 weeks afterwards  . TOTAL KNEE ARTHROPLASTY Right 09/06/2013   Dr Mayer Camel  . TOTAL KNEE ARTHROPLASTY Right 09/06/2013   Procedure: TOTAL KNEE ARTHROPLASTY;  Surgeon: Kerin Salen, MD;  Location: [place];  Service: Orthopedics;  Laterality: Right;  . TOTAL KNEE ARTHROPLASTY Left 11/01/2013   DR Mayer Camel  . TOTAL KNEE ARTHROPLASTY Left 11/01/2013   Procedure: TOTAL KNEE ARTHROPLASTY- LEFT;  Surgeon: Kerin Salen, MD;  Location: [place];  Service: Orthopedics;  Laterality: Left;    There were no vitals filed for this visit.   Subjective Assessment - 03/21/21 0806    Subjective Pt states he push mowed his lawn and picked up debris and was very sore after. States he plans to return to work at end of the month    Patient Stated Goals return to work and recreation  activities    Currently in Pain? Yes    Pain Score 2     Pain Location Shoulder    Pain Orientation Right    Pain Descriptors / Indicators Sore                             [facility] Adult PT Treatment/Exercise - 03/21/21 0001      Shoulder Exercises: Standing   Extension Both;Strengthening   30 reps   Theraband Level (Shoulder Extension) Level 3 (Green)    Retraction Both   30 reps   Theraband Level (Shoulder Retraction) Level 3 (Green)    Other Standing Exercises scaption 1 UE holding and the other moving x 10 bilat 3#    Other Standing Exercises holding 5#KB at 90/90 2 laps, box carry 10# x 2 laps      Shoulder Exercises: ROM/Strengthening   UBE (Upper Arm Bike) level 6 x 4 min alt fwd/bkwd    Wall Pushups 20 reps    Wall Pushups Limitations push up plus    Ball on Wall bounce theraball on wall x 1 min x 2      Vasopneumatic   Number Minutes Vasopneumatic  10 minutes  Vasopnuematic Location  Shoulder    Vasopneumatic Pressure Low    Vasopneumatic Temperature  34      Manual Therapy   Joint Mobilization inferior and A/P mobs                       PT Long Term Goals - 03/14/21 0924      PT LONG TERM GOAL #1   Title Pt will be independent with HEP    Status On-going      PT LONG TERM GOAL #2   Title Pt will improve FOTO to >= 72 to demo improved functional abilities    Baseline 62 on 03/14/21    Status On-going      PT LONG TERM GOAL #3   Title Pt will improve Rt shoulder strength to 4/5 to progress towards return to work    Baseline 3+    Status On-going      PT LONG TERM GOAL #4   Title Improve tolerance for work related tasks    Status On-going                 Plan - 03/21/21 0839    Clinical Impression Statement Pt tolerated progression of work simulation activities well. Pt will continue to benefit from increased mm endurance tasks    PT Next Visit Plan work readiness    PT [place]  and Agree with Plan of Care Patient           Patient will benefit from skilled therapeutic intervention in order to improve the following deficits and impairments:     Visit Diagnosis: Pain of right upper extremity  Muscle weakness (generalized)  Other symptoms and signs involving the musculoskeletal system  Localized edema     Problem List Patient Active Problem List   Diagnosis Date Noted  . 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 Rotator cuff syndrome, right 02/04/2019  . Primary osteoarthritis of both first carpometacarpal joints 06/03/2017  . BPH (benign prostatic hyperplasia) 04/26/2016  . Arthritis of left knee 11/01/2013  . Arthritis of knee, right 09/08/2013  . Obstructive sleep apnea 07/14/2013  . Left Achilles tendonitis 04/23/2013  . Low back pain 04/23/2013  . Osteoarthritis of both knees 03/19/2013  . Coronary atherosclerosis 09/06/2011  . 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  . Status post coronary artery bypass grafting 08/27/2011  . Hyperlipidemia with target low density lipoprotein (LDL) cholesterol less than 70 mg/dL 01/10/2011  . OBESITY, UNSPECIFIED 01/09/2011  . WEIGHT GAIN 01/09/2011  . BACK STRAIN 10/02/2010  . ADJUSTMENT DISORDER WITH DEPRESSED MOOD 03/31/2009  . HYPERTENSION, BENIGN ESSENTIAL 11/21/2008  . VARICOSE VEINS LOWER EXTREMITIES W/OTH COMPS 08/31/2008  . IMPAIRED FASTING GLUCOSE 08/31/2008  . COLONIC POLYPS 04/20/2008  . GERD 03/16/2008   Brenee Gajda, PT  Aedin Jeansonne 03/21/2021, 8:40 AM  [facility] [place], [name], [address] Phone: [phone]   Fax:  [phone]  Name: Bobby White MRN: [id number] Date of Birth: September 27, 1961

## 2021-03-27 DIAGNOSIS — R5383 Other fatigue: Secondary | ICD-10-CM | POA: Diagnosis not present

## 2021-03-27 DIAGNOSIS — R6882 Decreased libido: Secondary | ICD-10-CM | POA: Diagnosis not present

## 2021-03-27 DIAGNOSIS — E291 Testicular hypofunction: Secondary | ICD-10-CM | POA: Diagnosis not present

## 2021-03-28 ENCOUNTER — Ambulatory Visit (INDEPENDENT_AMBULATORY_CARE_PROVIDER_SITE_OTHER): Payer: BC Managed Care – PPO | Admitting: Physical Therapy

## 2021-03-28 ENCOUNTER — Other Ambulatory Visit: Payer: Self-pay

## 2021-03-28 DIAGNOSIS — M6281 Muscle weakness (generalized): Secondary | ICD-10-CM

## 2021-03-28 DIAGNOSIS — M79601 Pain in right arm: Secondary | ICD-10-CM | POA: Diagnosis not present

## 2021-03-28 DIAGNOSIS — R29898 Other symptoms and signs involving the musculoskeletal system: Secondary | ICD-10-CM

## 2021-03-28 DIAGNOSIS — R600 Localized edema: Secondary | ICD-10-CM | POA: Diagnosis not present

## 2021-03-28 NOTE — Therapy (Signed)
[place], [name], [address] Phone: [phone]   Fax:  [phone]  Physical Therapy Treatment  Patient Details  Name: Bobby White MRN: [id number] Date of Birth: 28-Jan-1961 Referring Provider (PT): Tania Ade   Encounter Date: 03/28/2021   PT End of Session - 03/28/21 0839    Visit Number 14    Number of Visits 22    Date for PT Re-Evaluation 04/11/21    PT Start Time 0802    PT Stop Time 0850    PT Time Calculation (min) 48 min    Activity Tolerance Patient tolerated treatment well    Behavior During Therapy [facility] for tasks assessed/performed           Past Medical History:  Diagnosis Date  . Arthritis   . Coronary artery disease   . GERD (gastroesophageal reflux disease)   . Hepatitis    Hep C --had to get shots  . Hypertension 1/10  . Impaired fasting glucose 2009  . Obesity   . Sleep apnea   . Varicose vein     Past Surgical History:  Procedure Laterality Date  . CARDIAC CATHETERIZATION     2012  . CORONARY ARTERY BYPASS GRAFT  07/25/11   3 vessel  . pins in right hand  1990's   4th and 5th digits-- removed 6 weeks afterwards  . TOTAL KNEE ARTHROPLASTY Right 09/06/2013   Dr Mayer Camel  . TOTAL KNEE ARTHROPLASTY Right 09/06/2013   Procedure: TOTAL KNEE ARTHROPLASTY;  Surgeon: Kerin Salen, MD;  Location: [place];  Service: Orthopedics;  Laterality: Right;  . TOTAL KNEE ARTHROPLASTY Left 11/01/2013   DR Mayer Camel  . TOTAL KNEE ARTHROPLASTY Left 11/01/2013   Procedure: TOTAL KNEE ARTHROPLASTY- LEFT;  Surgeon: Kerin Salen, MD;  Location: [place];  Service: Orthopedics;  Laterality: Left;    There were no vitals filed for this visit.   Subjective Assessment - 03/28/21 0804    Subjective Pt states he was able to mow his lawn with no increase in shoulder pain    Patient Stated Goals return to work and recreation activities    Currently in Pain? Yes    Pain Score 2     Pain  Location Shoulder    Pain Orientation Right    Pain Descriptors / Indicators Aching;Sore              [facility] PT Assessment - 03/28/21 0001      AROM   Right Shoulder Flexion 170 Degrees    Right Shoulder ABduction 153 Degrees    Right Shoulder Internal Rotation 58 Degrees    Right Shoulder External Rotation 81 Degrees                         [facility] Adult PT Treatment/Exercise - 03/28/21 0001      Shoulder Exercises: Standing   Other Standing Exercises scaption 1 UE holding and the other moving x 10 bilat 3#, serratus wall clock x 10 red TB, 5# KB carry x 2 laps, 20# box carry x 2 laps    Other Standing Exercises zoom ball with 3# Rt UE x 2 minutes      Shoulder Exercises: ROM/Strengthening   UBE (Upper Arm Bike) L6 x 4 min alt fwd/bkwd    Ball on Wall bounce theraball on wall 2 x 1 minute      Vasopneumatic   Number Minutes Vasopneumatic  10 minutes  Vasopnuematic Location  Shoulder    Vasopneumatic Pressure Low    Vasopneumatic Temperature  34      Manual Therapy   Manual therapy comments AROM measurements taken    Passive ROM IR with contract/relax                       PT Long Term Goals - 03/14/21 0924      PT LONG TERM GOAL #1   Title Pt will be independent with HEP    Status On-going      PT LONG TERM GOAL #2   Title Pt will improve FOTO to >= 72 to demo improved functional abilities    Baseline 62 on 03/14/21    Status On-going      PT LONG TERM GOAL #3   Title Pt will improve Rt shoulder strength to 4/5 to progress towards return to work    Baseline 3+    Status On-going      PT LONG TERM GOAL #4   Title Improve tolerance for work related tasks    Status On-going                 Plan - 03/28/21 0839    Clinical Impression Statement Pt continues to tolerate the progression of endurance and work simulation activities. AROM has improved in all directions, limitations mostly in IR.  Pt will continue to benefit from  progressing mm endurance activities    PT Next Visit Plan work readiness    PT [place]           Patient will benefit from skilled therapeutic intervention in order to improve the following deficits and impairments:     Visit Diagnosis: Pain of right upper extremity  Muscle weakness (generalized)  Other symptoms and signs involving the musculoskeletal system  Localized edema     Problem List Patient Active Problem List   Diagnosis Date Noted  . 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 Rotator cuff syndrome, right 02/04/2019  . Primary osteoarthritis of both first carpometacarpal joints 06/03/2017  . BPH (benign prostatic hyperplasia) 04/26/2016  . Arthritis of left knee 11/01/2013  . Arthritis of knee, right 09/08/2013  . Obstructive sleep apnea 07/14/2013  . Left Achilles tendonitis 04/23/2013  . Low back pain 04/23/2013  . Osteoarthritis of both knees 03/19/2013  . Coronary atherosclerosis 09/06/2011  . 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  . Status post coronary artery bypass grafting 08/27/2011  . Hyperlipidemia with target low density lipoprotein (LDL) cholesterol less than 70 mg/dL 01/10/2011  . OBESITY, UNSPECIFIED 01/09/2011  . WEIGHT GAIN 01/09/2011  . BACK STRAIN 10/02/2010  . ADJUSTMENT DISORDER WITH DEPRESSED MOOD 03/31/2009  . HYPERTENSION, BENIGN ESSENTIAL 11/21/2008  . VARICOSE VEINS LOWER EXTREMITIES W/OTH COMPS 08/31/2008  . IMPAIRED FASTING GLUCOSE 08/31/2008  . COLONIC POLYPS 04/20/2008  . GERD 03/16/2008   Kaleya Douse, PT  Lior Hoen 03/28/2021, 8:41 AM  [facility] [place], [name], [address] Phone: [phone]   Fax:  [phone]  Name: Bobby White MRN: [id number] Date of Birth: [birth year]

## 2021-04-04 ENCOUNTER — Other Ambulatory Visit: Payer: Self-pay

## 2021-04-04 ENCOUNTER — Ambulatory Visit (INDEPENDENT_AMBULATORY_CARE_PROVIDER_SITE_OTHER): Payer: BC Managed Care – PPO | Admitting: Physical Therapy

## 2021-04-04 DIAGNOSIS — M6281 Muscle weakness (generalized): Secondary | ICD-10-CM | POA: Diagnosis not present

## 2021-04-04 DIAGNOSIS — R29898 Other symptoms and signs involving the musculoskeletal system: Secondary | ICD-10-CM

## 2021-04-04 DIAGNOSIS — R600 Localized edema: Secondary | ICD-10-CM | POA: Diagnosis not present

## 2021-04-04 DIAGNOSIS — M79601 Pain in right arm: Secondary | ICD-10-CM

## 2021-04-04 NOTE — Therapy (Signed)
[place], [name], [address] Phone: [phone]   Fax:  [phone]  Physical Therapy Treatment  Patient Details  Name: Bobby White MRN: [id number] Date of Birth: 08-02-1961 Referring Provider (PT): Tania Ade   Encounter Date: 04/04/2021   PT End of Session - 04/04/21 0921    Visit Number 15    Number of Visits 22    Date for PT Re-Evaluation 04/11/21    PT Start Time 0845    PT Stop Time 0930    PT Time Calculation (min) 45 min    Activity Tolerance Patient tolerated treatment well    Behavior During Therapy [facility] for tasks assessed/performed           Past Medical History:  Diagnosis Date  . Arthritis   . Coronary artery disease   . GERD (gastroesophageal reflux disease)   . Hepatitis    Hep C --had to get shots  . Hypertension 1/10  . Impaired fasting glucose 2009  . Obesity   . Sleep apnea   . Varicose vein     Past Surgical History:  Procedure Laterality Date  . CARDIAC CATHETERIZATION     2012  . CORONARY ARTERY BYPASS GRAFT  07/25/11   3 vessel  . pins in right hand  1990's   4th and 5th digits-- removed 6 weeks afterwards  . TOTAL KNEE ARTHROPLASTY Right 09/06/2013   Dr Mayer Camel  . TOTAL KNEE ARTHROPLASTY Right 09/06/2013   Procedure: TOTAL KNEE ARTHROPLASTY;  Surgeon: Kerin Salen, MD;  Location: [place];  Service: Orthopedics;  Laterality: Right;  . TOTAL KNEE ARTHROPLASTY Left 11/01/2013   DR Mayer Camel  . TOTAL KNEE ARTHROPLASTY Left 11/01/2013   Procedure: TOTAL KNEE ARTHROPLASTY- LEFT;  Surgeon: Kerin Salen, MD;  Location: [place];  Service: Orthopedics;  Laterality: Left;    There were no vitals filed for this visit.   Subjective Assessment - 04/04/21 0848    Subjective Pt states he hopes to return to work next week    Patient Stated Goals return to work and recreation activities    Currently in Pain? No/denies                              [facility] Adult PT Treatment/Exercise - 04/04/21 0001      Shoulder Exercises: Standing   Other Standing Exercises rebounder overhead blue med ball 2 x 1 min, wall clock red TB x 20, 8# carry 90/90 x 2 laps, 8# 180 flexion x 2 laps    Other Standing Exercises zoom ball x 2 min 3# wt bilat      Shoulder Exercises: ROM/Strengthening   UBE (Upper Arm Bike) L6 x 4 min alt fwd/bkwd    Ball on Wall bounce theraball on wall 2 x 1 minute      Vasopneumatic   Number Minutes Vasopneumatic  10 minutes    Vasopnuematic Location  Shoulder    Vasopneumatic Pressure Low    Vasopneumatic Temperature  34                       PT Long Term Goals - 03/14/21 0924      PT LONG TERM GOAL #1   Title Pt will be independent with HEP    Status On-going      PT LONG TERM GOAL #2   Title Pt will improve FOTO to >=  72 to demo improved functional abilities    Baseline 62 on 03/14/21    Status On-going      PT LONG TERM GOAL #3   Title Pt will improve Rt shoulder strength to 4/5 to progress towards return to work    Baseline 3+    Status On-going      PT LONG TERM GOAL #4   Title Improve tolerance for work related tasks    Status On-going                 Plan - 04/04/21 2956    Clinical Impression Statement Pt continues to fatigue quickly with shoulder endurance activites, improving strength and mobility. Pt will continue to benefit from work simulation and endurance exercises    PT Next Visit Plan work readiness    PT [place]           Patient will benefit from skilled therapeutic intervention in order to improve the following deficits and impairments:     Visit Diagnosis: Pain of right upper extremity  Muscle weakness (generalized)  Other symptoms and signs involving the musculoskeletal system  Localized edema     Problem List Patient Active Problem List   Diagnosis Date Noted  . Wartenberg syndrome, right  06/09/2020  . Rotator cuff syndrome, right 02/04/2019  . Primary osteoarthritis of both first carpometacarpal joints 06/03/2017  . BPH (benign prostatic hyperplasia) 04/26/2016  . Arthritis of left knee 11/01/2013  . Arthritis of knee, right 09/08/2013  . Obstructive sleep apnea 07/14/2013  . Left Achilles tendonitis 04/23/2013  . Low back pain 04/23/2013  . Osteoarthritis of both knees 03/19/2013  . Coronary atherosclerosis 09/06/2011  . 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  . Status post coronary artery bypass grafting 08/27/2011  . Hyperlipidemia with target low density lipoprotein (LDL) cholesterol less than 70 mg/dL 01/10/2011  . OBESITY, UNSPECIFIED 01/09/2011  . WEIGHT GAIN 01/09/2011  . BACK STRAIN 10/02/2010  . ADJUSTMENT DISORDER WITH DEPRESSED MOOD 03/31/2009  . HYPERTENSION, BENIGN ESSENTIAL 11/21/2008  . VARICOSE VEINS LOWER EXTREMITIES W/OTH COMPS 08/31/2008  . IMPAIRED FASTING GLUCOSE 08/31/2008  . COLONIC POLYPS 04/20/2008  . GERD 03/16/2008   Bobby White, PT  Evolett Somarriba 04/04/2021, 9:23 AM  [facility] [place], [name], [address] Phone: [phone]   Fax:  [phone]  Name: Bobby White MRN: [id number] Date of Birth: Sep 10, 1961

## 2021-04-09 DIAGNOSIS — Z09 Encounter for follow-up examination after completed treatment for conditions other than malignant neoplasm: Secondary | ICD-10-CM | POA: Diagnosis not present

## 2021-04-11 ENCOUNTER — Other Ambulatory Visit: Payer: Self-pay

## 2021-04-11 ENCOUNTER — Ambulatory Visit (INDEPENDENT_AMBULATORY_CARE_PROVIDER_SITE_OTHER): Payer: BC Managed Care – PPO | Admitting: Physical Therapy

## 2021-04-11 DIAGNOSIS — R600 Localized edema: Secondary | ICD-10-CM

## 2021-04-11 DIAGNOSIS — M79601 Pain in right arm: Secondary | ICD-10-CM | POA: Diagnosis not present

## 2021-04-11 DIAGNOSIS — M6281 Muscle weakness (generalized): Secondary | ICD-10-CM | POA: Diagnosis not present

## 2021-04-11 DIAGNOSIS — R29898 Other symptoms and signs involving the musculoskeletal system: Secondary | ICD-10-CM | POA: Diagnosis not present

## 2021-04-11 NOTE — Therapy (Signed)
[place], [name], [address] Phone: [phone]   Fax:  [phone]  Physical Therapy Treatment and Discharge  Patient Details  Name: Bobby White MRN: [id number] Date of Birth: 12-08-1960 Referring Provider (PT): chandler   Encounter Date: 04/11/2021   PT End of Session - 04/11/21 0920    Visit Number 16    Number of Visits 22    Date for PT Re-Evaluation 04/11/21    PT Start Time 0845    PT Stop Time 0930    PT Time Calculation (min) 45 min    Activity Tolerance Patient tolerated treatment well    Behavior During Therapy [facility] for tasks assessed/performed           Past Medical History:  Diagnosis Date  . Arthritis   . Coronary artery disease   . GERD (gastroesophageal reflux disease)   . Hepatitis    Hep C --had to get shots  . Hypertension 1/10  . Impaired fasting glucose 2009  . Obesity   . Sleep apnea   . Varicose vein     Past Surgical History:  Procedure Laterality Date  . CARDIAC CATHETERIZATION     2012  . CORONARY ARTERY BYPASS GRAFT  07/25/11   3 vessel  . pins in right hand  1990's   4th and 5th digits-- removed 6 weeks afterwards  . TOTAL KNEE ARTHROPLASTY Right 09/06/2013   Dr Mayer Camel  . TOTAL KNEE ARTHROPLASTY Right 09/06/2013   Procedure: TOTAL KNEE ARTHROPLASTY;  Surgeon: Kerin Salen, MD;  Location: [place];  Service: Orthopedics;  Laterality: Right;  . TOTAL KNEE ARTHROPLASTY Left 11/01/2013   DR Mayer Camel  . TOTAL KNEE ARTHROPLASTY Left 11/01/2013   Procedure: TOTAL KNEE ARTHROPLASTY- LEFT;  Surgeon: Kerin Salen, MD;  Location: [place];  Service: Orthopedics;  Laterality: Left;    There were no vitals filed for this visit.   Subjective Assessment - 04/11/21 0847    Subjective Pt states he will be back to work on Monday    Patient Stated Goals return to work and recreation activities    Currently in Pain? No/denies              [facility] PT Assessment -  04/11/21 0001      Assessment   Referring Provider (PT) chandler      Observation/Other Assessments   Focus on Therapeutic Outcomes (FOTO)  63      AROM   Right Shoulder Flexion 170 Degrees    Right Shoulder ABduction 150 Degrees    Right Shoulder Internal Rotation 69 Degrees    Right Shoulder External Rotation 80 Degrees      Strength   Overall Strength Comments Rt shoulder flexion 4/5, abduction 3/5                         [facility] Adult PT Treatment/Exercise - 04/11/21 0001      Shoulder Exercises: Standing   Other Standing Exercises rebounder single arm toss/catch 2kg ball x 1 min, overhead toss rebounder blue ball x 1 min, ring arc 5# on UE 2 x 1 min    Other Standing Exercises wall clock green TB x 20, ball toss overhead 2 x 1 min, carry 90/90 and overhead 10# KB 2 laps each      Shoulder Exercises: ROM/Strengthening   UBE (Upper Arm Bike) L6 x 4 min alt fwd/bkwd      Vasopneumatic  Number Minutes Vasopneumatic  10 minutes    Vasopnuematic Location  Shoulder    Vasopneumatic Pressure Low    Vasopneumatic Temperature  34                       PT Long Term Goals - 04/11/21 0848      PT LONG TERM GOAL #1   Title Pt will be independent with HEP    Status Achieved      PT LONG TERM GOAL #2   Title Pt will improve FOTO to >= 72 to demo improved functional abilities    Baseline 63    Status Partially Met      PT LONG TERM GOAL #3   Title Pt will improve Rt shoulder strength to 4/5 to progress towards return to work    Baseline 3-4/5    Status Partially Met      PT LONG TERM GOAL #4   Title Improve tolerance for work related tasks    Status Achieved                 Plan - 04/11/21 0921    Clinical Impression Statement Pt continues with decreased mm endurance but has improved Rt shoulder strength and ROM. Pt plans to return to work and d/c from PT at this time. Pt educated to continue ice and stretching    PT Next Visit Plan d/c     PT Home Exercise Plan [id number]    Consulted and Agree with Plan of Care Patient           Patient will benefit from skilled therapeutic intervention in order to improve the following deficits and impairments:     Visit Diagnosis: Pain of right upper extremity  Muscle weakness (generalized)  Other symptoms and signs involving the musculoskeletal system  Localized edema     Problem List Patient Active Problem List   Diagnosis Date Noted  . 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 Rotator cuff syndrome, right 02/04/2019  . Primary osteoarthritis of both first carpometacarpal joints 06/03/2017  . BPH (benign prostatic hyperplasia) 04/26/2016  . Arthritis of left knee 11/01/2013  . Arthritis of knee, right 09/08/2013  . Obstructive sleep apnea 07/14/2013  . Left Achilles tendonitis 04/23/2013  . Low back pain 04/23/2013  . Osteoarthritis of both knees 03/19/2013  . Coronary atherosclerosis 09/06/2011  . 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  . Status post coronary artery bypass grafting 08/27/2011  . Hyperlipidemia with target low density lipoprotein (LDL) cholesterol less than 70 mg/dL 01/10/2011  . OBESITY, UNSPECIFIED 01/09/2011  . WEIGHT GAIN 01/09/2011  . BACK STRAIN 10/02/2010  . ADJUSTMENT DISORDER WITH DEPRESSED MOOD 03/31/2009  . HYPERTENSION, BENIGN ESSENTIAL 11/21/2008  . VARICOSE VEINS LOWER EXTREMITIES W/OTH COMPS 08/31/2008  . IMPAIRED FASTING GLUCOSE 08/31/2008  . COLONIC POLYPS 04/20/2008  . GERD 03/16/2008  PHYSICAL THERAPY DISCHARGE SUMMARY  Visits from Start of Care: 16  Current functional level related to goals / functional outcomes: Improved strength and ROM and mobility   Remaining deficits: Decreased mm endurance   Education / Equipment: HEP  Plan: Patient agrees to discharge.  Patient goals were partially met. Patient is being discharged due to being pleased with the current functional level.  ?????      Isabelle Course, PT   [facility] 04/11/2021, 9:23 AM  [facility] [place] [place], [name], [address] Phone: [phone]   Fax:  [phone]  Name: Bobby White MRN:  [id number] Date of Birth: 07/27/1961

## 2021-04-19 ENCOUNTER — Encounter: Payer: Self-pay | Admitting: Family Medicine

## 2021-04-20 ENCOUNTER — Other Ambulatory Visit: Payer: Self-pay | Admitting: *Deleted

## 2021-04-20 MED ORDER — ATORVASTATIN CALCIUM 40 MG PO TABS: 1.0000 | ORAL_TABLET | Freq: Every day | ORAL | 3 refills | Status: DC

## 2021-05-02 ENCOUNTER — Other Ambulatory Visit: Payer: Self-pay | Admitting: Sports Medicine

## 2021-05-02 DIAGNOSIS — G5630 Lesion of radial nerve, unspecified upper limb: Secondary | ICD-10-CM

## 2021-05-08 ENCOUNTER — Ambulatory Visit (INDEPENDENT_AMBULATORY_CARE_PROVIDER_SITE_OTHER): Payer: BC Managed Care – PPO

## 2021-05-08 ENCOUNTER — Ambulatory Visit (INDEPENDENT_AMBULATORY_CARE_PROVIDER_SITE_OTHER): Payer: BC Managed Care – PPO | Admitting: Sports Medicine

## 2021-05-08 DIAGNOSIS — M75102 Unspecified rotator cuff tear or rupture of left shoulder, not specified as traumatic: Secondary | ICD-10-CM

## 2021-05-08 DIAGNOSIS — Z9889 Other specified postprocedural states: Secondary | ICD-10-CM

## 2021-05-08 NOTE — Progress Notes (Signed)
    Procedures performed today:    Procedure: Real-time Ultrasound Guided injection of the left subacromial bursa Device: Samsung HS60  Verbal informed consent obtained.  Time-out conducted.  Noted no overlying erythema, induration, or other signs of local infection.  Skin prepped in a sterile fashion.  Local anesthesia: Topical Ethyl chloride.  With sterile technique and under real time ultrasound guidance: Noted subacromial bursitis and intact rotator cuff tendons, 1 cc Kenalog 40, 1 cc lidocaine, 1 cc bupivacaine injected easily Completed without difficulty  Advised to call if fevers/chills, erythema, induration, drainage, or persistent bleeding.  Images permanently stored and available for review in PACS.  Impression: Technically successful ultrasound guided injection.  Independent interpretation of notes and tests performed by another provider:   None.  Brief History, Exam, Impression, and Recommendations:    S/P right rotator cuff repair Repair with Dr. Tamera Punt, doing well.  Left rotator cuff tear This pleasant 60 year old male is also having chronic left shoulder pain, localized over the deltoid, worse with overhead activities, positive Neer sign, he does have weakness to abduction and external rotation suspicious for tearing of the supraspinatus and infraspinatus. Due to his current pain we will do a subacromial injection, we would also like x-rays and an MRI of the shoulder for surgical planning.    ___________________________________________ Gwen Her. Dianah Field, M.D., ABFM., CAQSM. Primary Care and [place] Instructor of [place] [employer] [facility]

## 2021-05-08 NOTE — Assessment & Plan Note (Signed)
Repair with Dr. Tamera Punt, doing well.

## 2021-05-08 NOTE — Assessment & Plan Note (Signed)
This pleasant 60 year old male is also having chronic left shoulder pain, localized over the deltoid, worse with overhead activities, positive Neer sign, he does have weakness to abduction and external rotation suspicious for tearing of the supraspinatus and infraspinatus. Due to his current pain we will do a subacromial injection, we would also like x-rays and an MRI of the shoulder for surgical planning.

## 2021-05-10 DIAGNOSIS — Z6839 Body mass index (BMI) 39.0-39.9, adult: Secondary | ICD-10-CM | POA: Diagnosis not present

## 2021-05-10 DIAGNOSIS — J3089 Other allergic rhinitis: Secondary | ICD-10-CM | POA: Diagnosis not present

## 2021-05-10 DIAGNOSIS — G4733 Obstructive sleep apnea (adult) (pediatric): Secondary | ICD-10-CM | POA: Diagnosis not present

## 2021-05-12 DIAGNOSIS — G4733 Obstructive sleep apnea (adult) (pediatric): Secondary | ICD-10-CM | POA: Diagnosis not present

## 2021-05-19 ENCOUNTER — Other Ambulatory Visit: Payer: Self-pay

## 2021-05-19 ENCOUNTER — Ambulatory Visit (INDEPENDENT_AMBULATORY_CARE_PROVIDER_SITE_OTHER): Payer: BC Managed Care – PPO

## 2021-05-19 DIAGNOSIS — M75102 Unspecified rotator cuff tear or rupture of left shoulder, not specified as traumatic: Secondary | ICD-10-CM | POA: Diagnosis not present

## 2021-05-19 DIAGNOSIS — M25522 Pain in left elbow: Secondary | ICD-10-CM | POA: Diagnosis not present

## 2021-05-19 DIAGNOSIS — M542 Cervicalgia: Secondary | ICD-10-CM | POA: Diagnosis not present

## 2021-05-19 DIAGNOSIS — M75112 Incomplete rotator cuff tear or rupture of left shoulder, not specified as traumatic: Secondary | ICD-10-CM | POA: Diagnosis not present

## 2021-06-01 DIAGNOSIS — Z09 Encounter for follow-up examination after completed treatment for conditions other than malignant neoplasm: Secondary | ICD-10-CM | POA: Diagnosis not present

## 2021-06-01 DIAGNOSIS — M67912 Unspecified disorder of synovium and tendon, left shoulder: Secondary | ICD-10-CM | POA: Diagnosis not present

## 2021-06-12 ENCOUNTER — Other Ambulatory Visit: Payer: Self-pay

## 2021-06-12 ENCOUNTER — Ambulatory Visit (INDEPENDENT_AMBULATORY_CARE_PROVIDER_SITE_OTHER): Payer: BC Managed Care – PPO | Admitting: Family Medicine

## 2021-06-12 ENCOUNTER — Encounter: Payer: Self-pay | Admitting: Family Medicine

## 2021-06-12 VITALS — BP 128/81 | HR 71 | Ht 71.0 in | Wt 269.0 lb

## 2021-06-12 DIAGNOSIS — R220 Localized swelling, mass and lump, head: Secondary | ICD-10-CM

## 2021-06-12 DIAGNOSIS — R7303 Prediabetes: Secondary | ICD-10-CM

## 2021-06-12 DIAGNOSIS — R7301 Impaired fasting glucose: Secondary | ICD-10-CM

## 2021-06-12 DIAGNOSIS — I10 Essential (primary) hypertension: Secondary | ICD-10-CM | POA: Diagnosis not present

## 2021-06-12 LAB — POCT GLYCOSYLATED HEMOGLOBIN (HGB A1C): Hemoglobin A1C: 5.4 % (ref 4.0–5.6)

## 2021-06-12 NOTE — Progress Notes (Signed)
Established Patient Office Visit  Subjective:  Patient ID: Bobby White, male    DOB: [birth year]  Age: 60 y.o. MRN: [id number]  CC:  Chief Complaint  Patient presents with   Follow-up    6 mth follow up HTN,: also a lump above right ear, some tenderness    HPI Bobby White presents for   Hypertension- Pt denies chest pain, SOB, dizziness, or heart palpitations.  Taking meds as directed w/o problems.  Denies medication side effects.    Impaired fasting glucose-no increased thirst or urination. No symptoms consistent with hypoglycemia.  Noticed a lump above his right ear on the scalp for several months he had never noticed it previously but it feels very hard like bone.  He said he also noticed he was getting a little bit of a rash right around the area as well he tried a couple different shampoos and even tried switching to [facility] which did seem to help a little bit with the rash.  He has been scratching at it some.  Past Medical History:  Diagnosis Date   Arthritis    Coronary artery disease    GERD (gastroesophageal reflux disease)    Hepatitis    Hep C --had to get shots   Hypertension 1/10   Impaired fasting glucose 2009   Obesity    Sleep apnea    Varicose vein     Past Surgical History:  Procedure Laterality Date   CARDIAC CATHETERIZATION     2012   CORONARY ARTERY BYPASS GRAFT  07/25/11   3 vessel   pins in right hand  1990's   4th and 5th digits-- removed 6 weeks afterwards   TOTAL KNEE ARTHROPLASTY Right 09/06/2013   Dr Mayer Camel   TOTAL KNEE ARTHROPLASTY Right 09/06/2013   Procedure: TOTAL KNEE ARTHROPLASTY;  Surgeon: Kerin Salen, MD;  Location: [place];  Service: Orthopedics;  Laterality: Right;   TOTAL KNEE ARTHROPLASTY Left 11/01/2013   DR Mayer Camel   TOTAL KNEE ARTHROPLASTY Left 11/01/2013   Procedure: TOTAL KNEE ARTHROPLASTY- LEFT;  Surgeon: Kerin Salen, MD;  Location: [place];  Service: Orthopedics;  Laterality: Left;    Family History   Problem Relation Age of Onset   Diabetes Mother    Cancer Father        pancreatic    Social History   Socioeconomic History   Marital status: Married    Spouse name: Not on file   Number of children: Not on file   Years of education: Not on file   Highest education level: Not on file  Occupational History   Not on file  Tobacco Use   Smoking status: Former    Packs/day: 1.50    Years: 15.00    Pack years: 22.50    Types: Cigarettes    Quit date: 11/18/1996    Years since quitting: 24.5   Smokeless tobacco: Never  Vaping Use   Vaping Use: Former  Substance and Sexual Activity   Alcohol use: Yes    Comment: socially   Drug use: Yes    Types: Marijuana    Comment: last time 2012 after his heart surgery   Sexual activity: Not on file  Other Topics Concern   Not on file  Social History Narrative   Not on file   Social Determinants of Health   Financial Resource Strain: Not on file  Food Insecurity: Not on file  Transportation Needs: Not on file  Physical Activity: Not  on file  Stress: Not on file  Social Connections: Not on file  Intimate Partner Violence: Not on file    Outpatient Medications Prior to Visit  Medication Sig Dispense Refill   Albuterol Sulfate (PROAIR RESPICLICK) [id number] (90 Base) MCG/ACT AEPB 1 puff as needed     AMBULATORY NON FORMULARY MEDICATION Take 25 mg by mouth daily. Medication Name: DIM 25 mg     amitriptyline (ELAVIL) 75 MG tablet TAKE 1 TABLET BY MOUTH AT BEDTIME 30 tablet 0   amLODipine (NORVASC) 10 MG tablet Take 1 tablet by mouth daily.     amLODipine (NORVASC) 5 MG tablet TAKE 1 TABLET DAILY 90 tablet 1   aspirin EC 325 MG tablet Take 1 tablet (325 mg total) by mouth 2 (two) times daily. 30 tablet 0   atorvastatin (LIPITOR) 40 MG tablet Take 1 tablet (40 mg total) by mouth daily. 90 tablet 3   b complex vitamins tablet Take 4 tablets by mouth daily.     Chondroitin Sulfate 400 MG CAPS See admin instructions.     Cinnamon 500 MG  capsule Take 500 mg by mouth 2 (two) times daily.     DHEA 25 MG CAPS Take 25 mg by mouth daily.     diclofenac (VOLTAREN) 75 MG EC tablet TAKE 1 TABLET BY MOUTH TWICE DAILY 180 tablet 3   fluticasone (FLONASE) 50 MCG/ACT nasal spray Place into the nose.     Ginger, Zingiber officinalis, (GINGER ROOT) 250 MG CAPS Take by mouth.     GLUCOMANNAN KONJAC ROOT PO Take 1,500 mg by mouth daily.     KRILL OIL PO Take 1 capsule by mouth daily.     LINIMENTS EX Apply topically.     metoprolol succinate (TOPROL-XL) 25 MG 24 hr tablet TAKE 1 TABLET BY MOUTH DAILY GENERIC EQUIVALENT FOR TOPROX- XL 90 tablet 1   Misc. Devices MISC New Bipap mask, hose, and humidity chamber for OSA on Bipap therapy.  Send to the current DME.     Multiple Vitamins-Minerals (MULTIVITAMIN WITH MINERALS) tablet Take 1 tablet by mouth daily.     Omega-3 Fatty Acids (FISH OIL) 1000 MG CAPS Take 1,000 mg by mouth daily.     omeprazole (PRILOSEC) 40 MG capsule TAKE 1 CAPSULE DAILY 90 capsule 3   pyridOXINE (VITAMIN B-6) 50 MG tablet Take 1 tablet (50 mg total) by mouth daily. 90 tablet 3   sildenafil (REVATIO) 20 MG tablet 1 tablet     tamsulosin (FLOMAX) 0.4 MG CAPS capsule TAKE 2 CAPSULES BY MOUTH DAILY 180 capsule 3   Turmeric 500 MG CAPS Take 1 capsule by mouth daily.     nitroGLYCERIN (NITROSTAT) 0.4 MG SL tablet Place 1 tablet (0.4 mg total) under the tongue every 5 (five) minutes as needed for chest pain. 12 tablet prn   No facility-administered medications prior to visit.    Allergies  Allergen Reactions   Poison Ivy Extract Itching, Rash and Other (See Comments)   Ace Inhibitors Other (See Comments)    REACTION: cough   Sulfamethoxazole-Trimethoprim     Other reaction(s): hives    ROS Review of Systems    Objective:    Physical Exam Constitutional:      Appearance: Normal appearance. He is well-developed.  HENT:     Head: Normocephalic and atraumatic.  Cardiovascular:     Rate and Rhythm: Normal rate and  regular rhythm.     Heart sounds: Normal heart sounds.  Pulmonary:  Effort: Pulmonary effort is normal.     Breath sounds: Normal breath sounds.  Skin:    General: Skin is warm and dry.     Comments: Just above right ear has a hard approx 2 cm area that if firm and raised.    Neurological:     Mental Status: He is alert and oriented to person, place, and time. Mental status is at baseline.  Psychiatric:        Behavior: Behavior normal.    BP 128/81   Pulse 71   Ht '5\' 11"'$  (1.803 m)   Wt 269 lb (122 kg)   SpO2 97%   BMI 37.52 kg/m  Wt Readings from Last 3 Encounters:  06/12/21 269 lb (122 kg)  12/13/20 266 lb (120.7 kg)  06/12/20 (!) 273 lb (123.8 kg)     Health Maintenance Due  Topic Date Due   COVID-19 Vaccine (1) Never done   Zoster Vaccines- Shingrix (1 of 2) Never done   COLONOSCOPY (Pts 45-76yr Insurance coverage will need to be confirmed)  08/03/2020    There are no preventive care reminders to display for this patient.  Lab Results  Component Value Date   TSH 1.66 11/23/2019   TSH 1.663 11/23/2019   Lab Results  Component Value Date   WBC 5.1 06/12/2020   HGB 15.8 06/12/2020   HCT 48.4 06/12/2020   MCV 84.3 06/12/2020   PLT 239 06/12/2020   Lab Results  Component Value Date   NA 139 12/13/2020   K 4.2 12/13/2020   CO2 30 12/13/2020   GLUCOSE 74 12/13/2020   BUN 15 12/13/2020   CREATININE 0.91 12/13/2020   BILITOT 0.6 06/12/2020   ALKPHOS 95 05/19/2017   AST 18 06/12/2020   ALT 23 06/12/2020   PROT 6.3 06/12/2020   ALBUMIN 4.5 05/19/2017   CALCIUM 9.4 12/13/2020   Lab Results  Component Value Date   CHOL 135 06/12/2020   Lab Results  Component Value Date   HDL 44 06/12/2020   Lab Results  Component Value Date   LDLCALC 74 06/12/2020   Lab Results  Component Value Date   TRIG 88 06/12/2020   Lab Results  Component Value Date   CHOLHDL 3.1 06/12/2020   Lab Results  Component Value Date   HGBA1C 5.4 06/12/2021       Assessment & Plan:   Problem List Items Addressed This Visit       Cardiovascular and Mediastinum   HYPERTENSION, BENIGN ESSENTIAL - Primary    Well controlled. Continue current regimen. Follow up in  6 mo         Relevant Medications   amLODipine (NORVASC) 10 MG tablet   sildenafil (REVATIO) 20 MG tablet   Other Relevant Orders   COMPLETE METABOLIC PANEL WITH GFR   Lipid panel   PSA     Endocrine   IMPAIRED FASTING GLUCOSE    Looks great!  Recheck in one year!        Relevant Orders   COMPLETE METABOLIC PANEL WITH GFR   Lipid panel   PSA   Other Visit Diagnoses     Pre-diabetes       Relevant Orders   POCT HgB A1C (Completed)   Head lump       Relevant Orders   [place]SOFT TISSUE HEAD & NECK (NON-THYROID)      Head lump - will get [place] further work up to see if part of skull or LN.  It does sound like her had some dermatitis around the area but changed shampoos and that that helped.     No orders of the defined types were placed in this encounter.   Follow-up: Return in about 6 months (around 12/13/2021) for Hypertension.    Beatrice Lecher, MD

## 2021-06-12 NOTE — Assessment & Plan Note (Signed)
Looks great!  Recheck in one year!

## 2021-06-12 NOTE — Assessment & Plan Note (Signed)
Well controlled. Continue current regimen. Follow up in  6 mo  

## 2021-06-13 NOTE — Progress Notes (Signed)
All labs are normal. 

## 2021-06-14 LAB — COMPLETE METABOLIC PANEL WITH GFR
AG Ratio: 2.1 (calc) (ref 1.0–2.5)
ALT: 23 U/L (ref 9–46)
AST: 21 U/L (ref 10–35)
Albumin: 4.5 g/dL (ref 3.6–5.1)
Alkaline phosphatase (APISO): 72 U/L (ref 35–144)
BUN: 17 mg/dL (ref 7–25)
CO2: 30 mmol/L (ref 20–32)
Calcium: 9.8 mg/dL (ref 8.6–10.3)
Chloride: 103 mmol/L (ref 98–110)
Creat: 0.9 mg/dL (ref 0.70–1.35)
Globulin: 2.1 g/dL (calc) (ref 1.9–3.7)
Glucose, Bld: 103 mg/dL — ABNORMAL HIGH (ref 65–99)
Potassium: 5.2 mmol/L (ref 3.5–5.3)
Sodium: 140 mmol/L (ref 135–146)
Total Bilirubin: 0.6 mg/dL (ref 0.2–1.2)
Total Protein: 6.6 g/dL (ref 6.1–8.1)
eGFR: 97 mL/min/{1.73_m2} (ref >=60)

## 2021-06-14 LAB — LIPID PANEL
Cholesterol: 128 mg/dL (ref <200)
HDL: 38 mg/dL — ABNORMAL LOW (ref >=40)
LDL Cholesterol (Calc): 68 mg/dL (calc)
Non-HDL Cholesterol (Calc): 90 mg/dL (calc) (ref <130)
Total CHOL/HDL Ratio: 3.4 (calc) (ref <5.0)
Triglycerides: 141 mg/dL (ref <150)

## 2021-06-14 LAB — PSA: PSA: 1.22 ng/mL (ref <=4.00)

## 2021-06-15 ENCOUNTER — Other Ambulatory Visit: Payer: Self-pay

## 2021-06-15 ENCOUNTER — Ambulatory Visit (INDEPENDENT_AMBULATORY_CARE_PROVIDER_SITE_OTHER): Payer: BC Managed Care – PPO

## 2021-06-15 DIAGNOSIS — R220 Localized swelling, mass and lump, head: Secondary | ICD-10-CM

## 2021-06-15 DIAGNOSIS — R590 Localized enlarged lymph nodes: Secondary | ICD-10-CM | POA: Diagnosis not present

## 2021-06-20 ENCOUNTER — Other Ambulatory Visit: Payer: Self-pay

## 2021-06-20 ENCOUNTER — Ambulatory Visit (INDEPENDENT_AMBULATORY_CARE_PROVIDER_SITE_OTHER): Payer: BC Managed Care – PPO | Admitting: Sports Medicine

## 2021-06-20 DIAGNOSIS — M75102 Unspecified rotator cuff tear or rupture of left shoulder, not specified as traumatic: Secondary | ICD-10-CM | POA: Diagnosis not present

## 2021-06-20 NOTE — Progress Notes (Signed)
    Procedures performed today:    None.  Independent interpretation of notes and tests performed by another provider:   None.  Brief History, Exam, Impression, and Recommendations:    Left rotator cuff tear This is a pleasant 60 year old male, he has chronic left shoulder pain, he is post right cuff repair with Dr. Tamera Punt. He had impingement signs and symptoms at the last visit we did a subacromial injection and added physical therapy, he is doing well, it is more of an annoyance now rather than pain, he did get an MRI at the last visit that showed some significant rotator cuff tearing, he is not yet however ready to consider discussing this with Dr. Tamera Punt. Return to see me as needed.    ___________________________________________ Gwen Her. Dianah Field, M.D., ABFM., CAQSM. Primary Care and [place] Instructor of [place] of [facility] of Medicine

## 2021-06-20 NOTE — Assessment & Plan Note (Signed)
This is a pleasant 60 year old male, he has chronic left shoulder pain, he is post right cuff repair with Dr. Tamera Punt. He had impingement signs and symptoms at the last visit we did a subacromial injection and added physical therapy, he is doing well, it is more of an annoyance now rather than pain, he did get an MRI at the last visit that showed some significant rotator cuff tearing, he is not yet however ready to consider discussing this with Dr. Tamera Punt. Return to see me as needed.

## 2021-07-14 ENCOUNTER — Other Ambulatory Visit: Payer: Self-pay | Admitting: Sports Medicine

## 2021-07-14 ENCOUNTER — Other Ambulatory Visit: Payer: Self-pay | Admitting: Family Medicine

## 2021-07-14 DIAGNOSIS — G5630 Lesion of radial nerve, unspecified upper limb: Secondary | ICD-10-CM

## 2021-08-10 DIAGNOSIS — M75112 Incomplete rotator cuff tear or rupture of left shoulder, not specified as traumatic: Secondary | ICD-10-CM | POA: Diagnosis not present

## 2021-08-21 DIAGNOSIS — Z79890 Hormone replacement therapy (postmenopausal): Secondary | ICD-10-CM | POA: Diagnosis not present

## 2021-08-21 DIAGNOSIS — R5383 Other fatigue: Secondary | ICD-10-CM | POA: Diagnosis not present

## 2021-08-21 DIAGNOSIS — E291 Testicular hypofunction: Secondary | ICD-10-CM | POA: Diagnosis not present

## 2021-08-28 DIAGNOSIS — M2550 Pain in unspecified joint: Secondary | ICD-10-CM | POA: Diagnosis not present

## 2021-08-28 DIAGNOSIS — E291 Testicular hypofunction: Secondary | ICD-10-CM | POA: Diagnosis not present

## 2021-08-28 DIAGNOSIS — Z6836 Body mass index (BMI) 36.0-36.9, adult: Secondary | ICD-10-CM | POA: Diagnosis not present

## 2021-08-28 DIAGNOSIS — R5383 Other fatigue: Secondary | ICD-10-CM | POA: Diagnosis not present

## 2021-09-18 ENCOUNTER — Telehealth: Payer: Self-pay

## 2021-09-18 NOTE — Telephone Encounter (Signed)
Pt called and LVM stating that he needed refills on 4 antibiotics. I called pt and LVM letting him know that we do not refill atbx without an OV/VV. Looking in his chart I do not see that he is currently on any atbx. Advised him to call us at [phone] to schedule a OV/VV for further evaluation.

## 2021-09-19 ENCOUNTER — Other Ambulatory Visit: Payer: Self-pay | Admitting: *Deleted

## 2021-09-19 MED ORDER — AMOXICILLIN 500 MG PO CAPS: 2000.0000 mg | ORAL_CAPSULE | Freq: Once | ORAL | 0 refills | Status: AC

## 2021-09-19 NOTE — Telephone Encounter (Signed)
Pt called back to correct the message about the ABX. He stated that the Antibiotic is was he usually gets BEFORE his dental procedure. And it is 4 tablets that he takes at once.   Medication sent to pharmacy.

## 2021-10-02 DIAGNOSIS — I2510 Atherosclerotic heart disease of native coronary artery without angina pectoris: Secondary | ICD-10-CM | POA: Diagnosis not present

## 2021-10-02 DIAGNOSIS — I10 Essential (primary) hypertension: Secondary | ICD-10-CM | POA: Diagnosis not present

## 2021-10-02 DIAGNOSIS — Z951 Presence of aortocoronary bypass graft: Secondary | ICD-10-CM | POA: Diagnosis not present

## 2021-10-02 DIAGNOSIS — E7800 Pure hypercholesterolemia, unspecified: Secondary | ICD-10-CM | POA: Diagnosis not present

## 2021-10-16 DIAGNOSIS — M24112 Other articular cartilage disorders, left shoulder: Secondary | ICD-10-CM | POA: Diagnosis not present

## 2021-10-16 DIAGNOSIS — M75102 Unspecified rotator cuff tear or rupture of left shoulder, not specified as traumatic: Secondary | ICD-10-CM | POA: Diagnosis not present

## 2021-10-16 DIAGNOSIS — M7542 Impingement syndrome of left shoulder: Secondary | ICD-10-CM | POA: Diagnosis not present

## 2021-10-16 DIAGNOSIS — G8918 Other acute postprocedural pain: Secondary | ICD-10-CM | POA: Diagnosis not present

## 2021-10-16 DIAGNOSIS — M75112 Incomplete rotator cuff tear or rupture of left shoulder, not specified as traumatic: Secondary | ICD-10-CM | POA: Diagnosis not present

## 2021-10-29 ENCOUNTER — Other Ambulatory Visit: Payer: Self-pay | Admitting: Sports Medicine

## 2021-10-29 DIAGNOSIS — G5630 Lesion of radial nerve, unspecified upper limb: Secondary | ICD-10-CM

## 2021-10-30 ENCOUNTER — Other Ambulatory Visit: Payer: Self-pay

## 2021-10-30 ENCOUNTER — Emergency Department (INDEPENDENT_AMBULATORY_CARE_PROVIDER_SITE_OTHER)
Admission: EM | Admit: 2021-10-30 | Discharge: 2021-10-30 | Disposition: A | Discharge status: Home / Self Care | Payer: BC Managed Care – PPO | Source: Home / Self Care

## 2021-10-30 ENCOUNTER — Encounter: Payer: Self-pay | Admitting: Sports Medicine

## 2021-10-30 ENCOUNTER — Emergency Department (INDEPENDENT_AMBULATORY_CARE_PROVIDER_SITE_OTHER): Payer: BC Managed Care – PPO

## 2021-10-30 DIAGNOSIS — M542 Cervicalgia: Secondary | ICD-10-CM

## 2021-10-30 DIAGNOSIS — R29890 Loss of height: Secondary | ICD-10-CM | POA: Diagnosis not present

## 2021-10-30 DIAGNOSIS — G5630 Lesion of radial nerve, unspecified upper limb: Secondary | ICD-10-CM

## 2021-10-30 MED ORDER — AMITRIPTYLINE HCL 75 MG PO TABS: 75.0000 mg | ORAL_TABLET | Freq: Every day | ORAL | 0 refills | Status: DC

## 2021-10-30 MED ORDER — HYDROCODONE-ACETAMINOPHEN 10-325 MG PO TABS: 1.0000 | ORAL_TABLET | Freq: Four times a day (QID) | ORAL | 0 refills | Status: DC | PRN

## 2021-10-30 MED ORDER — BACLOFEN 10 MG PO TABS: 10.0000 mg | ORAL_TABLET | Freq: Three times a day (TID) | ORAL | 0 refills | Status: DC

## 2021-10-30 NOTE — ED Triage Notes (Signed)
Pt c/o neck pain since Saturday. Says its completely locked up and he cant turn or tilt his head. Had rotator cuff surgery 2 weeks ago and has been sleeping in different positions due to surgery. Pain 7/10

## 2021-10-30 NOTE — Discharge Instructions (Addendum)
Advised patient of today neck x-ray results.  Instructed patient that diclofenac will be backbone medication for the next 7 to 10 days.  Advised may use Norco for breakthrough neck pain.  Advised may use baclofen daily or as needed for concurrent neck spasms.  Advised patient to take medication as directed with food to completion.  Encouraged patient to increase daily water intake while taking these medications.

## 2021-10-30 NOTE — ED Provider Notes (Signed)
[facility]    CSN: [id number] Arrival date & time: 10/30/21  0805      History   Chief Complaint Chief Complaint  Patient presents with   Neck Pain    HPI Bobby White is a 60 y.o. male.   HPI 60 year old male presents with neck pain for 3 days.  Reports that his neck is completely locked up and cannot turn or tilts his head.  Patient reports had rotator cuff (LEFT) surgery 2 weeks ago and has been sleeping in different positions due to this surgery.  He currently rates neck pain as 7 of 10.  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   S/P right rotator cuff repair 02/04/2019   Primary osteoarthritis of both first carpometacarpal joints 06/03/2017   BPH (benign prostatic hyperplasia) 04/26/2016   Arthritis of left knee 11/01/2013   Arthritis of knee, right 09/08/2013   Obstructive sleep apnea 07/14/2013   Left Achilles tendonitis 04/23/2013   Low back pain 04/23/2013   Osteoarthritis of both knees 03/19/2013   Coronary atherosclerosis 09/06/2011   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   Status post coronary artery bypass grafting 08/27/2011   Hyperlipidemia with target low density lipoprotein (LDL) cholesterol less than 70 mg/dL 01/10/2011   OBESITY, UNSPECIFIED 01/09/2011   WEIGHT GAIN 01/09/2011   BACK STRAIN 10/02/2010   ADJUSTMENT DISORDER WITH DEPRESSED MOOD 03/31/2009   HYPERTENSION, BENIGN ESSENTIAL 11/21/2008   VARICOSE VEINS LOWER EXTREMITIES W/OTH COMPS 08/31/2008   IMPAIRED FASTING GLUCOSE 08/31/2008   COLONIC POLYPS 04/20/2008   GERD 03/16/2008    Past Surgical History:   Procedure Laterality Date   CARDIAC CATHETERIZATION     2012   CORONARY ARTERY BYPASS GRAFT  07/25/11   3 vessel   pins in right hand  1990's   4th and 5th digits-- removed 6 weeks afterwards   TOTAL KNEE ARTHROPLASTY Right 09/06/2013   Dr Mayer Camel   TOTAL KNEE ARTHROPLASTY Right 09/06/2013   Procedure: TOTAL KNEE ARTHROPLASTY;  Surgeon: Kerin Salen, MD;  Location: [place];  Service: Orthopedics;  Laterality: Right;   TOTAL KNEE ARTHROPLASTY Left 11/01/2013   DR Mayer Camel   TOTAL KNEE ARTHROPLASTY Left 11/01/2013   Procedure: TOTAL KNEE ARTHROPLASTY- LEFT;  Surgeon: Kerin Salen, MD;  Location: [facility];  Service: Orthopedics;  Laterality: Left;       Home Medications    Prior to Admission medications   Medication Sig Start Date End Date Taking? Authorizing Provider  baclofen (LIORESAL) 10 MG tablet Take 1 tablet (10 mg total) by mouth 3 (three) times daily. 10/30/21  Yes Eliezer Lofts, FNP  HYDROcodone-acetaminophen (NORCO) 10-325 MG tablet Take 1 tablet by mouth every 6 (six) hours as needed. 10/30/21  Yes Eliezer Lofts, FNP  Albuterol Sulfate (PROAIR RESPICLICK) 188 (90 Base) MCG/ACT AEPB 1 puff as needed    [provider]  AMBULATORY NON FORMULARY MEDICATION Take 25 mg by mouth daily. Medication Name: DIM 25 mg    [provider]  amitriptyline (ELAVIL) 75 MG tablet TAKE 1 TABLET  BY MOUTH AT BEDTIME 07/15/21   Silverio Decamp, MD  amLODipine (NORVASC) 10 MG tablet Take 1 tablet by mouth daily. 09/21/20   [provider]  aspirin EC 325 MG tablet Take 1 tablet (325 mg total) by mouth 2 (two) times daily. Patient taking differently: Take 81 mg by mouth once. 11/01/13   Leighton Parody, PA-C  atorvastatin (LIPITOR) 40 MG tablet Take 1 tablet (40 mg total) by mouth daily. 04/20/21   Hali Marry, MD  b complex vitamins tablet Take 4 tablets by mouth daily.    [provider]  Chondroitin Sulfate 400 MG CAPS See admin instructions.     [provider]  Cinnamon 500 MG capsule Take 500 mg by mouth 2 (two) times daily.    [provider]  DHEA 25 MG CAPS Take 25 mg by mouth daily.    [provider]  diclofenac (VOLTAREN) 75 MG EC tablet TAKE 1 TABLET BY MOUTH TWICE DAILY 11/28/20   Silverio Decamp, MD  fluticasone (FLONASE) 50 MCG/ACT nasal spray Place into the nose. 05/10/21 05/10/22  [provider]  Ginger, Zingiber officinalis, (GINGER ROOT) 250 MG CAPS Take by mouth.    [provider]  GLUCOMANNAN KONJAC ROOT PO Take 1,500 mg by mouth daily.    [provider]  KRILL OIL PO Take 1 capsule by mouth daily.    [provider]  LINIMENTS EX Apply topically.    [provider]  metoprolol succinate (TOPROL-XL) 25 MG 24 hr tablet TAKE 1 TABLET BY MOUTH DAILY GENERIC EQUIVALENT FOR TOPROX- XL 01/27/19   Hali Marry, MD  Misc. Devices MISC New Bipap mask, hose, and humidity chamber for OSA on Bipap therapy.  Send to the current DME. 05/10/21   [provider]  Multiple Vitamins-Minerals (MULTIVITAMIN WITH MINERALS) tablet Take 1 tablet by mouth daily.    [provider]  nitroGLYCERIN (NITROSTAT) 0.4 MG SL tablet Place 1 tablet (0.4 mg total) under the tongue every 5 (five) minutes as needed for chest pain. 06/02/18 06/02/19  Hali Marry, MD  Omega-3 Fatty Acids (FISH OIL) 1000 MG CAPS Take 1,000 mg by mouth daily.    [provider]  omeprazole (PRILOSEC) 40 MG capsule TAKE 1 CAPSULE DAILY 01/08/18   Hali Marry, MD  pyridOXINE (VITAMIN B-6) 50 MG tablet Take 1 tablet (50 mg total) by mouth daily. 06/09/20   Silverio Decamp, MD  sildenafil (REVATIO) 20 MG tablet 1 tablet    [provider]  tamsulosin (FLOMAX) 0.4 MG CAPS capsule TAKE 2 CAPSULES BY MOUTH DAILY 07/17/21   Hali Marry, MD  Turmeric 500 MG CAPS Take 1 capsule by mouth daily.    [provider]    Family  History Family History  Problem Relation Age of Onset   Diabetes Mother    Cancer Father        pancreatic    Social History Social History   Tobacco Use   Smoking status: Former    Packs/day: 1.50    Years: 15.00    Pack years: 22.50    Types: Cigarettes    Quit date: 11/18/1996    Years since quitting: 24.9   Smokeless tobacco: Never  Vaping Use   Vaping Use: Former  Substance Use Topics   Alcohol use: Yes    Comment: socially   Drug use: Yes    Types: Marijuana    Comment: last time 2012 after his  heart surgery     Allergies   Poison ivy extract, Ace inhibitors, and Sulfamethoxazole-trimethoprim   Review of Systems Review of Systems  Musculoskeletal:  Positive for neck pain.  All other systems reviewed and are negative.   Physical Exam Triage Vital Signs ED Triage Vitals  Enc Vitals Group     BP 10/30/21 0822 (!) 147/89     Pulse Rate 10/30/21 0822 84     Resp 10/30/21 0822 18     Temp 10/30/21 0822 98.5 F (36.9 C)     Temp Source 10/30/21 0822 Oral     SpO2 10/30/21 0822 99 %     Weight --      Height --      Head Circumference --      Peak Flow --      Pain Score 10/30/21 0819 7     Pain Loc --      Pain Edu? --      Excl. in [place]? --    No data found.  Updated Vital Signs BP (!) 147/89 (BP Location: Left Arm)    Pulse 84    Temp 98.5 F (36.9 C) (Oral)    Resp 18    SpO2 99%   Physical Exam Vitals and nursing note reviewed.  Constitutional:      General: He is not in acute distress.    Appearance: Normal appearance. He is obese. He is ill-appearing.  HENT:     Head: Normocephalic and atraumatic.     Nose: Nose normal.     Mouth/Throat:     Mouth: Mucous membranes are moist.     Pharynx: Oropharynx is clear.  Eyes:     Extraocular Movements: Extraocular movements intact.     Conjunctiva/sclera: Conjunctivae normal.     Pupils: Pupils are equal, round, and reactive to light.  Neck:     Comments: Neck (posterior aspect): Moderate to  severe TTP over bilateral superior splenis muscles, severe limited range of motion with flexion/extension, and lateral rotation. Cardiovascular:     Rate and Rhythm: Normal rate and regular rhythm.     Pulses: Normal pulses.     Heart sounds: Normal heart sounds.  Pulmonary:     Effort: Pulmonary effort is normal.     Breath sounds: Normal breath sounds.  Musculoskeletal:        General: Normal range of motion.  Skin:    General: Skin is warm and dry.  Neurological:     General: No focal deficit present.     Mental Status: He is alert and oriented to person, place, and time. Mental status is at baseline.     UC Treatments / Results  Labs (all labs ordered are listed, but only abnormal results are displayed) Labs Reviewed - No data to display  EKG   Radiology DG Cervical Spine Complete  Result Date: 10/30/2021 CLINICAL DATA:  Neck pain EXAM: CERVICAL SPINE - COMPLETE 4+ VIEW COMPARISON:  None. FINDINGS: No evidence of cervical spine fracture or prevertebral soft tissue swelling. Mild multilevel degenerative disc disease, most pronounced at C5-C6 and C6-C7 with there is mild disc space height loss. Mild facet arthropathy. Visualized dens is intact on open-mouth view. Partially visualized median sternotomy wires. IMPRESSION: No radiographic evidence of acute cervical spine injury. Mild degenerative changes. Electronically Signed   By: Yetta Glassman M.D.   On: 10/30/2021 09:40    Procedures Procedures (including critical care time)  Medications Ordered in UC Medications - No data to  display  Initial Impression / Assessment and Plan / UC Course  I have reviewed the triage vital signs and the nursing notes.  Pertinent labs & imaging results that were available during my care of the patient were reviewed by me and considered in my medical decision making (see chart for details).     MDM: 1.  Neck pain-cervical spine x-ray revealed above, I used of these results.  Instructed  patient to restart Diclofenac 75 mg twice daily as previously prescribed, Rx'd Norco and Baclofen.  Instructed patient that diclofenac will be backbone medication for the next 7 to 10 days.  Advised may use Norco for breakthrough neck pain.  Advised may use baclofen daily or as needed for concurrent neck spasms.  Encouraged patient to increase daily water intake while taking these medications.  Discharged home, hemodynamically stable. Final Clinical Impressions(s) / UC Diagnoses   Final diagnoses:  Neck pain     Discharge Instructions      Advised patient of today neck x-ray results.  Instructed patient that diclofenac will be backbone medication for the next 7 to 10 days.  Advised may use Norco for breakthrough neck pain.  Advised may use baclofen daily or as needed for concurrent neck spasms.  Advised patient to take medication as directed with food to completion.  Encouraged patient to increase daily water intake while taking these medications.     ED Prescriptions     Medication Sig Dispense Auth. Provider   baclofen (LIORESAL) 10 MG tablet Take 1 tablet (10 mg total) by mouth 3 (three) times daily. 30 each Eliezer Lofts, FNP   HYDROcodone-acetaminophen (NORCO) 10-325 MG tablet Take 1 tablet by mouth every 6 (six) hours as needed. 30 tablet Eliezer Lofts, FNP      I have reviewed the PDMP during this encounter.   Eliezer Lofts, [place] 10/30/21 1008

## 2021-11-08 DIAGNOSIS — G4733 Obstructive sleep apnea (adult) (pediatric): Secondary | ICD-10-CM | POA: Diagnosis not present

## 2021-11-15 ENCOUNTER — Ambulatory Visit (INDEPENDENT_AMBULATORY_CARE_PROVIDER_SITE_OTHER): Payer: BC Managed Care – PPO | Admitting: Physical Therapy

## 2021-11-15 ENCOUNTER — Encounter: Payer: Self-pay | Admitting: Physical Therapy

## 2021-11-15 ENCOUNTER — Other Ambulatory Visit: Payer: Self-pay

## 2021-11-15 DIAGNOSIS — R600 Localized edema: Secondary | ICD-10-CM | POA: Diagnosis not present

## 2021-11-15 DIAGNOSIS — M25612 Stiffness of left shoulder, not elsewhere classified: Secondary | ICD-10-CM | POA: Diagnosis not present

## 2021-11-15 DIAGNOSIS — M6281 Muscle weakness (generalized): Secondary | ICD-10-CM | POA: Diagnosis not present

## 2021-11-15 DIAGNOSIS — M25512 Pain in left shoulder: Secondary | ICD-10-CM | POA: Diagnosis not present

## 2021-11-15 NOTE — Therapy (Signed)
[place], [name], [address] Phone: [phone]   Fax:  [phone]  Physical Therapy Evaluation  Patient Details  Name: Bobby White MRN: [id number] Date of Birth: 28-Oct-1961 Referring Provider (PT): Tania Ade, MD   Encounter Date: 11/15/2021   PT End of Session - 11/15/21 0803     Visit Number 1    Number of Visits 24    Date for PT Re-Evaluation 02/07/22    Authorization Type BCBS    PT Start Time 0804    PT Stop Time 0845    PT Time Calculation (min) 41 min    Activity Tolerance Patient tolerated treatment well    Behavior During Therapy [facility] for tasks assessed/performed             Past Medical History:  Diagnosis Date   Arthritis    Coronary artery disease    GERD (gastroesophageal reflux disease)    Hepatitis    Hep C --had to get shots   Hypertension 1/10   Impaired fasting glucose 2009   Obesity    Sleep apnea    Varicose vein     Past Surgical History:  Procedure Laterality Date   CARDIAC CATHETERIZATION     2012   CORONARY ARTERY BYPASS GRAFT  07/25/11   3 vessel   pins in right hand  1990's   4th and 5th digits-- removed 6 weeks afterwards   TOTAL KNEE ARTHROPLASTY Right 09/06/2013   Dr Mayer Camel   TOTAL KNEE ARTHROPLASTY Right 09/06/2013   Procedure: TOTAL KNEE ARTHROPLASTY;  Surgeon: Kerin Salen, MD;  Location: [place];  Service: Orthopedics;  Laterality: Right;   TOTAL KNEE ARTHROPLASTY Left 11/01/2013   DR Mayer Camel   TOTAL KNEE ARTHROPLASTY Left 11/01/2013   Procedure: TOTAL KNEE ARTHROPLASTY- LEFT;  Surgeon: Kerin Salen, MD;  Location: [place];  Service: Orthopedics;  Laterality: Left;    There were no vitals filed for this visit.    Subjective Assessment - 11/15/21 0806     Subjective Pt reports no issues with the shoulder. Pt does note he ended up putting his arm above his head while sleeping. Pt states he pinched a nerve ~3 weeks ago C5-C7. Pt  states he's supposed to wear sling for 6 weeks but forgot it today.    Limitations Lifting;House hold activities    How long can you sit comfortably? n/a    How long can you stand comfortably? n/a    How long can you walk comfortably? n/a    Patient Stated Goals Improve shoulder motion    Currently in Pain? Yes    Pain Score 5     Pain Location Shoulder    Pain Orientation Left    Pain Descriptors / Indicators Aching    Pain Type Surgical pain    Pain Radiating Towards Neck    Pain Onset 1 to 4 weeks ago    Pain Frequency Constant    Aggravating Factors  Any arm elevation    Pain Relieving Factors Voltaren cream, pain medication    Effect of Pain on Daily Activities Lifting, carrying, overhead movements                [facility] [address] PT Assessment - 11/15/21 0001       Assessment   Medical Diagnosis Z98.890 (ICD-10-CM) - S/P left rotator cuff repair    Referring Provider (PT) Tania Ade, MD    Onset Date/Surgical Date 10/17/21  Hand Dominance Right    Prior Therapy For shoulder      Precautions   Precautions Shoulder    Shoulder Interventions Shoulder abduction pillow;Shoulder sling/immobilizer   pt to use PRN until 6 weeks   Precaution Comments No lifting, no supporting body weight with hands/arms, no sudden jerking motions, no excessive behind the back movements, avoid UE bike      Restrictions   Weight Bearing Restrictions Yes      Balance Screen   Has the patient fallen in the past 6 months No      [place] residence    Living Arrangements Spouse/significant other    Available Help at Discharge Family    Type of [place]      Prior Function   Level of Independence Independent    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 Hand, wrist and elbow otherwise WFL      PROM   PROM Assessment Site Shoulder    Right/Left Shoulder Left    Left Shoulder Flexion 45 Degrees   to point of pain   Left Shoulder ABduction 55 Degrees    Left Shoulder Internal Rotation --   to body/chest   Left Shoulder External Rotation 20 Degrees      Palpation   Palpation comment TTP L UT and levator scalene                        Objective measurements completed on examination: See above findings.                PT Education - 11/15/21 [phone]     Education Details Exam findings, POC, initial HEP    Person(s) Educated Patient    Methods Explanation    Comprehension Verbalized understanding                 PT Long Term Goals - 11/15/21 1055       PT LONG TERM GOAL #1   Title Pt will be independent with HEP    Time 12    Period Weeks    Status New    Target Date 02/07/22      PT LONG TERM GOAL #2   Title Pt will improve FOTO to >=68    Baseline 42    Time 12    Period Weeks    Status New    Target Date 02/07/22      PT LONG TERM GOAL #3   Title Pt will have L=R shoulder ROM    Time 12    Period Weeks    Status New    Target Date 02/07/22      PT LONG TERM GOAL #4   Title Pt will be able to lift and carry at least 10 lbs overhead to demo improved functional shoulder strength    Time 12    Period Weeks    Status New    Target Date 02/07/22                    Plan - 11/15/21  0842     Clinical Impression Statement Bobby White is a 60 y/o M presenting to OPPT s/p L rotator cuff repair on 10/17/21. Pt is currently 60 going on 5 weeks post op. Precautions include: No lifting, no supporting body weight with hands/arms, no sudden jerking motions, no excessive behind the back movements, avoid UE bike. Pt is currently cleared for: AAROM in supine, progressive PROM until full  ROM, gentle scapular/glenohumeral joint mobs, initiate prone rowing to neutral arm position, cryotherapy as needed, aquatherapy, ice after exercise (per weeks 5-6 protocol). On assessment, pt demos decreased ROM and strength in L UE. Pt would benefit from PT to improve his overall function.    Personal Factors and Comorbidities Age;Time since onset of injury/illness/exacerbation    Examination-Activity Limitations Carry;Caring for Others;Lift;Toileting;Reach Overhead    Examination-Participation Restrictions Cleaning;Community Activity;Occupation;Yard Work    Stability/Clinical Decision Making Stable/Uncomplicated    Designer, jewellery Low    Rehab Potential Good    PT Frequency 2x / week    PT Duration 12 weeks    PT Treatment/Interventions ADLs/Self Care Home Management;Aquatic Therapy;Cryotherapy;Electrical Stimulation;Iontophoresis 4mg /ml Dexamethasone;Moist Heat;Functional mobility training;Therapeutic activities;Therapeutic exercise;Balance training;Neuromuscular re-education;Manual techniques;Patient/family education;Dry needling;Passive range of motion;Scar mobilization;Taping;Vasopneumatic Device    PT Next Visit Plan Modify/progress HEP as needed. See shoulder protocol. Shoulder PROM. Begin AAROM to tolerance in supine. Initiate scapular strengthening.    PT Home Exercise Plan [email or web address]    Consulted and Agree with Plan of Care Patient             Patient will benefit from skilled therapeutic intervention in order to improve the following deficits and impairments:  Decreased range of motion, Increased fascial restricitons, Impaired UE functional use, Pain, Hypomobility, Improper body mechanics, Decreased mobility, Decreased strength, Increased edema, Postural dysfunction  Visit Diagnosis: Acute pain of left shoulder  Stiffness of left shoulder, not elsewhere classified  Muscle weakness (generalized)  Localized edema     Problem List Patient Active Problem List    Diagnosis Date Noted   Left rotator cuff tear 05/08/2021   Wartenberg syndrome, 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   S/P right rotator cuff repair 02/04/2019   Primary osteoarthritis of both first carpometacarpal joints 06/03/2017   BPH (benign prostatic hyperplasia) 04/26/2016   Arthritis of left knee 11/01/2013   Arthritis of knee, right 09/08/2013   Obstructive sleep apnea 07/14/2013   Left Achilles tendonitis 04/23/2013   Low back pain 04/23/2013   Osteoarthritis of both knees 03/19/2013   Coronary atherosclerosis 09/06/2011   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   Status post coronary artery bypass grafting 08/27/2011   Hyperlipidemia with target low density lipoprotein (LDL) cholesterol less than 70 mg/dL 01/10/2011   OBESITY, UNSPECIFIED 01/09/2011   WEIGHT GAIN 01/09/2011   BACK STRAIN 10/02/2010   ADJUSTMENT DISORDER WITH DEPRESSED MOOD 03/31/2009   HYPERTENSION, BENIGN ESSENTIAL 11/21/2008   VARICOSE VEINS LOWER EXTREMITIES W/OTH COMPS 08/31/2008   IMPAIRED FASTING GLUCOSE 08/31/2008   COLONIC POLYPS 04/20/2008   GERD 03/16/2008    Bracha Frankowski April Ma L Myrtle, PT, DPT 11/15/2021, 11:14 AM  [facility] [place], [name], [address] Phone: [phone]   Fax:  [phone]  Name: Bobby White MRN: [id number] Date of Birth: 1961-06-22

## 2021-11-20 ENCOUNTER — Other Ambulatory Visit: Payer: Self-pay

## 2021-11-20 ENCOUNTER — Ambulatory Visit: Payer: BC Managed Care – PPO | Attending: Orthopedic Surgery | Admitting: Physical Therapy

## 2021-11-20 DIAGNOSIS — M542 Cervicalgia: Secondary | ICD-10-CM | POA: Insufficient documentation

## 2021-11-20 DIAGNOSIS — Z9889 Other specified postprocedural states: Secondary | ICD-10-CM | POA: Diagnosis not present

## 2021-11-20 DIAGNOSIS — M25612 Stiffness of left shoulder, not elsewhere classified: Secondary | ICD-10-CM

## 2021-11-20 DIAGNOSIS — M25512 Pain in left shoulder: Secondary | ICD-10-CM | POA: Diagnosis not present

## 2021-11-20 DIAGNOSIS — M6281 Muscle weakness (generalized): Secondary | ICD-10-CM

## 2021-11-20 DIAGNOSIS — R600 Localized edema: Secondary | ICD-10-CM

## 2021-11-20 NOTE — Therapy (Signed)
[place], [name], [address] Phone: [phone]   Fax:  [phone]  Physical Therapy Treatment  Patient Details  Name: Bobby White MRN: [id number] Date of Birth: May 28, 1961 Referring Provider (PT): Tania Ade, MD   Encounter Date: 11/20/2021   PT End of Session - 11/20/21 1220     Visit Number 2    Number of Visits 24    Date for PT Re-Evaluation 02/07/22    Authorization Type BCBS    PT Start Time 8299    PT Stop Time 1230    PT Time Calculation (min) 45 min    Activity Tolerance Patient tolerated treatment well    Behavior During Therapy [facility] for tasks assessed/performed             Past Medical History:  Diagnosis Date   Arthritis    Coronary artery disease    GERD (gastroesophageal reflux disease)    Hepatitis    Hep C --had to get shots   Hypertension 1/10   Impaired fasting glucose 2009   Obesity    Sleep apnea    Varicose vein     Past Surgical History:  Procedure Laterality Date   CARDIAC CATHETERIZATION     2012   CORONARY ARTERY BYPASS GRAFT  07/25/11   3 vessel   pins in right hand  1990's   4th and 5th digits-- removed 6 weeks afterwards   TOTAL KNEE ARTHROPLASTY Right 09/06/2013   Dr Mayer Camel   TOTAL KNEE ARTHROPLASTY Right 09/06/2013   Procedure: TOTAL KNEE ARTHROPLASTY;  Surgeon: Kerin Salen, MD;  Location: [place];  Service: Orthopedics;  Laterality: Right;   TOTAL KNEE ARTHROPLASTY Left 11/01/2013   DR Mayer Camel   TOTAL KNEE ARTHROPLASTY Left 11/01/2013   Procedure: TOTAL KNEE ARTHROPLASTY- LEFT;  Surgeon: Kerin Salen, MD;  Location: [place];  Service: Orthopedics;  Laterality: Left;    There were no vitals filed for this visit.   Subjective Assessment - 11/20/21 1152     Subjective Pt states he has been trying to go without the sling some times and the it is feeling "ok".    Currently in Pain? Yes    Pain Score 2     Pain Location Shoulder     Pain Orientation Left    Pain Descriptors / Indicators Aching    Pain Type Surgical pain                [facility] PT Assessment - 11/20/21 0001       Assessment   Medical Diagnosis Z98.890 (ICD-10-CM) - S/P left rotator cuff repair    Referring Provider (PT) Tania Ade, MD    Onset Date/Surgical Date 10/17/21    Hand Dominance Right    Prior Therapy For shoulder      Palpation   Palpation comment TTP L UT and levator                           [facility] Adult PT Treatment/Exercise - 11/20/21 0001       Exercises   Exercises Shoulder      Shoulder Exercises: Supine   Flexion AAROM;10 reps    Flexion Limitations with cane, 5 sec hold at end range      Shoulder Exercises: Seated   Other Seated Exercises scap squeeze x 15      Shoulder Exercises: Prone   Other Prone Exercises prone row  x 15 to neutral      Modalities   Modalities Vasopneumatic      Vasopneumatic   Number Minutes Vasopneumatic  10 minutes    Vasopnuematic Location  Shoulder    Vasopneumatic Pressure Low    Vasopneumatic Temperature  34      Manual Therapy   Manual Therapy Passive ROM    Passive ROM PROM Lt shoulder as tolerated per protocol                          PT Long Term Goals - 11/15/21 1055       PT LONG TERM GOAL #1   Title Pt will be independent with HEP    Time 12    Period Weeks    Status New    Target Date 02/07/22      PT LONG TERM GOAL #2   Title Pt will improve FOTO to >=68    Baseline 42    Time 12    Period Weeks    Status New    Target Date 02/07/22      PT LONG TERM GOAL #3   Title Pt will have L=R shoulder ROM    Time 12    Period Weeks    Status New    Target Date 02/07/22      PT LONG TERM GOAL #4   Title Pt will be able to lift and carry at least 10 lbs overhead to demo improved functional shoulder strength    Time 12    Period Weeks    Status New    Target Date 02/07/22                   Plan -  11/20/21 1220     Clinical Impression Statement Pt with good tolerance to addition of AAROM and scap squeezes, continues with limitations in Lt shoulder ER and flexion    PT Next Visit Plan progress per protocol, AAROM and PROM    PT Home Exercise Plan [email or web address]             Patient will benefit from skilled therapeutic intervention in order to improve the following deficits and impairments:     Visit Diagnosis: Acute pain of left shoulder  Stiffness of left shoulder, not elsewhere classified  Muscle weakness (generalized)  Localized edema     Problem List Patient Active Problem List   Diagnosis Date Noted   Left rotator cuff tear 05/08/2021   Wartenberg syndrome, 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   S/P right rotator cuff repair 02/04/2019   Primary osteoarthritis of both first carpometacarpal joints 06/03/2017   BPH (benign prostatic hyperplasia) 04/26/2016   Arthritis of left knee 11/01/2013   Arthritis of knee, right 09/08/2013   Obstructive sleep apnea 07/14/2013   Left Achilles tendonitis 04/23/2013   Low back pain 04/23/2013   Osteoarthritis of both knees 03/19/2013   Coronary atherosclerosis 09/06/2011   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   Status post coronary artery bypass grafting 08/27/2011   Hyperlipidemia with target low density lipoprotein (LDL) cholesterol less than 70 mg/dL 01/10/2011   OBESITY, UNSPECIFIED 01/09/2011   WEIGHT GAIN 01/09/2011   BACK STRAIN 10/02/2010   ADJUSTMENT DISORDER WITH DEPRESSED MOOD 03/31/2009   HYPERTENSION, BENIGN ESSENTIAL 11/21/2008   VARICOSE VEINS LOWER EXTREMITIES W/OTH COMPS 08/31/2008   IMPAIRED FASTING GLUCOSE 08/31/2008   COLONIC POLYPS 04/20/2008   GERD 03/16/2008    Mayla Biddy, PT 11/20/2021, 12:21 PM  [facility] [place], [name], [address] Phone: [phone]   Fax:  [phone]  Name: ZAIN LANKFORD MRN: [id number] Date of  Birth: 1961-02-26

## 2021-11-22 ENCOUNTER — Ambulatory Visit: Payer: BC Managed Care – PPO | Admitting: Physical Therapy

## 2021-11-22 ENCOUNTER — Other Ambulatory Visit: Payer: Self-pay

## 2021-11-22 DIAGNOSIS — M6281 Muscle weakness (generalized): Secondary | ICD-10-CM

## 2021-11-22 DIAGNOSIS — Z9889 Other specified postprocedural states: Secondary | ICD-10-CM | POA: Diagnosis not present

## 2021-11-22 DIAGNOSIS — M25512 Pain in left shoulder: Secondary | ICD-10-CM | POA: Diagnosis not present

## 2021-11-22 DIAGNOSIS — M542 Cervicalgia: Secondary | ICD-10-CM | POA: Diagnosis not present

## 2021-11-22 DIAGNOSIS — M25612 Stiffness of left shoulder, not elsewhere classified: Secondary | ICD-10-CM | POA: Diagnosis not present

## 2021-11-22 DIAGNOSIS — R600 Localized edema: Secondary | ICD-10-CM

## 2021-11-22 NOTE — Therapy (Signed)
[place], [name], [address] Phone: [phone]   Fax:  [phone]  Physical Therapy Treatment  Patient Details  Name: Bobby White MRN: [id number] Date of Birth: Oct 05, 1961 Referring Provider (PT): Tania Ade, MD   Encounter Date: 11/22/2021   PT End of Session - 11/22/21 1011     Visit Number 3    Number of Visits 24    Date for PT Re-Evaluation 02/07/22    Authorization Type BCBS    PT Start Time 0935    PT Stop Time 1015    PT Time Calculation (min) 40 min    Activity Tolerance Patient tolerated treatment well    Behavior During Therapy [facility] for tasks assessed/performed             Past Medical History:  Diagnosis Date   Arthritis    Coronary artery disease    GERD (gastroesophageal reflux disease)    Hepatitis    Hep C --had to get shots   Hypertension 1/10   Impaired fasting glucose 2009   Obesity    Sleep apnea    Varicose vein     Past Surgical History:  Procedure Laterality Date   CARDIAC CATHETERIZATION     2012   CORONARY ARTERY BYPASS GRAFT  07/25/11   3 vessel   pins in right hand  1990's   4th and 5th digits-- removed 6 weeks afterwards   TOTAL KNEE ARTHROPLASTY Right 09/06/2013   Dr Mayer Camel   TOTAL KNEE ARTHROPLASTY Right 09/06/2013   Procedure: TOTAL KNEE ARTHROPLASTY;  Surgeon: Kerin Salen, MD;  Location: [place];  Service: Orthopedics;  Laterality: Right;   TOTAL KNEE ARTHROPLASTY Left 11/01/2013   DR Mayer Camel   TOTAL KNEE ARTHROPLASTY Left 11/01/2013   Procedure: TOTAL KNEE ARTHROPLASTY- LEFT;  Surgeon: Kerin Salen, MD;  Location: [place];  Service: Orthopedics;  Laterality: Left;    There were no vitals filed for this visit.   Subjective Assessment - 11/22/21 1018     Subjective Pt reports he is no longer wearing sling.  He is trying to remember to not lean into Lt shoulder or reach behind back with LUE.    Patient Stated Goals Improve shoulder  motion    Currently in Pain? Yes    Pain Score 2     Pain Location Shoulder    Pain Orientation Left                [facility] PT Assessment - 11/22/21 0001       Assessment   Medical Diagnosis Z98.890 (ICD-10-CM) - S/P left rotator cuff repair    Referring Provider (PT) Tania Ade, MD    Onset Date/Surgical Date 10/17/21    Hand Dominance Right    Prior Therapy For shoulder              [facility] Adult PT Treatment/Exercise - 11/22/21 0001       Exercises   Exercises Wrist      Elbow Exercises   Elbow Flexion --      Neck Exercises: Stretches   Upper Trapezius Stretch Right;Left;2 reps;10 seconds    Levator Stretch Right;Left;2 reps;10 seconds      Shoulder Exercises: Supine   Flexion AAROM;10 reps    Flexion Limitations with cane, 5 sec hold at end range.  and bench press with cane x 10      Shoulder Exercises: Seated   Other Seated Exercises scap squeeze  x 5 sec x 5 reps;  shoulder rolls x 10 CW/CCW      Shoulder Exercises: Prone   Other Prone Exercises prone row x 15 to neutral      Shoulder Exercises: Standing   Extension AAROM;Both;10 reps   cane, limited range.     Wrist Exercises   Wrist Extension Left;20 reps    Bar Weights/Barbell (Wrist Extension) 2 lbs    Wrist Radial Deviation Left;20 reps   arm supported on pillow   Bar Weights/Barbell (Radial Deviation) 2 lbs    Other wrist exercises Lt forearm supination/pronation x 20 with 2#      Modalities   Modalities Vasopneumatic      Vasopneumatic   Number Minutes Vasopneumatic  10 minutes    Vasopnuematic Location  Shoulder    Vasopneumatic Pressure Medium    Vasopneumatic Temperature  34      Manual Therapy   Passive ROM PROM of Lt shoulder into flexion, scaption, ER, to tissue limits and no pain.                          PT Long Term Goals - 11/15/21 1055       PT LONG TERM GOAL #1   Title Pt will be independent with HEP    Time 12    Period Weeks    Status New     Target Date 02/07/22      PT LONG TERM GOAL #2   Title Pt will improve FOTO to >=68    Baseline 42    Time 12    Period Weeks    Status New    Target Date 02/07/22      PT LONG TERM GOAL #3   Title Pt will have L=R shoulder ROM    Time 12    Period Weeks    Status New    Target Date 02/07/22      PT LONG TERM GOAL #4   Title Pt will be able to lift and carry at least 10 lbs overhead to demo improved functional shoulder strength    Time 12    Period Weeks    Status New    Target Date 02/07/22                   Plan - 11/22/21 1012     Clinical Impression Statement Pt now 5 wks s/p RTC repain. He is tolerating PROM and AAROM exercises well.  Added resisted wrist exercises without difficulty.  Reviewed rehab protocol and restrictions/precautions; pt verbalized understanding.  Progressing well without protocol.    Rehab Potential Good    PT Frequency 2x / week    PT Duration 12 weeks    PT Treatment/Interventions ADLs/Self Care Home Management;Aquatic Therapy;Cryotherapy;Electrical Stimulation;Iontophoresis 4mg /ml Dexamethasone;Moist Heat;Functional mobility training;Therapeutic activities;Therapeutic exercise;Balance training;Neuromuscular re-education;Manual techniques;Patient/family education;Dry needling;Passive range of motion;Scar mobilization;Taping;Vasopneumatic Device    PT Next Visit Plan progress per protocol, AAROM and PROM for Lt shoulder    PT Home Exercise Plan [email or web address]             Patient will benefit from skilled therapeutic intervention in order to improve the following deficits and impairments:  Decreased range of motion, Increased fascial restricitons, Impaired UE functional use, Pain, Hypomobility, Improper body mechanics, Decreased mobility, Decreased strength, Increased edema, Postural dysfunction  Visit Diagnosis: Acute pain of left shoulder  Stiffness of left shoulder, not elsewhere classified  Muscle weakness  (generalized)  Localized  edema     Problem List Patient Active Problem List   Diagnosis Date Noted   Left rotator cuff tear 05/08/2021   Wartenberg syndrome, 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   S/P right rotator cuff repair 02/04/2019   Primary osteoarthritis of both first carpometacarpal joints 06/03/2017   BPH (benign prostatic hyperplasia) 04/26/2016   Arthritis of left knee 11/01/2013   Arthritis of knee, right 09/08/2013   Obstructive sleep apnea 07/14/2013   Left Achilles tendonitis 04/23/2013   Low back pain 04/23/2013   Osteoarthritis of both knees 03/19/2013   Coronary atherosclerosis 09/06/2011   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   Status post coronary artery bypass grafting 08/27/2011   Hyperlipidemia with target low density lipoprotein (LDL) cholesterol less than 70 mg/dL 01/10/2011   OBESITY, UNSPECIFIED 01/09/2011   WEIGHT GAIN 01/09/2011   BACK STRAIN 10/02/2010   ADJUSTMENT DISORDER WITH DEPRESSED MOOD 03/31/2009   HYPERTENSION, BENIGN ESSENTIAL 11/21/2008   VARICOSE VEINS LOWER EXTREMITIES W/OTH COMPS 08/31/2008   IMPAIRED FASTING GLUCOSE 08/31/2008   COLONIC POLYPS 04/20/2008   GERD 03/16/2008   Kerin Perna, PTA 11/22/21 10:36 AM   [facility] [place] [address] [place], [name], [address] Phone: [phone]   Fax:  [phone]  Name: Bobby White MRN: [id number] Date of Birth: 1961-02-01

## 2021-11-27 ENCOUNTER — Other Ambulatory Visit: Payer: Self-pay

## 2021-11-27 ENCOUNTER — Ambulatory Visit: Payer: BC Managed Care – PPO | Admitting: Physical Therapy

## 2021-11-27 DIAGNOSIS — Z9889 Other specified postprocedural states: Secondary | ICD-10-CM | POA: Diagnosis not present

## 2021-11-27 DIAGNOSIS — M6281 Muscle weakness (generalized): Secondary | ICD-10-CM | POA: Diagnosis not present

## 2021-11-27 DIAGNOSIS — M542 Cervicalgia: Secondary | ICD-10-CM | POA: Diagnosis not present

## 2021-11-27 DIAGNOSIS — M25612 Stiffness of left shoulder, not elsewhere classified: Secondary | ICD-10-CM | POA: Diagnosis not present

## 2021-11-27 DIAGNOSIS — M25512 Pain in left shoulder: Secondary | ICD-10-CM

## 2021-11-27 DIAGNOSIS — R600 Localized edema: Secondary | ICD-10-CM

## 2021-11-27 NOTE — Therapy (Signed)
[place], [name], [address] Phone: [phone]   Fax:  [phone]  Physical Therapy Treatment  Patient Details  Name: Bobby White MRN: [id number] Date of Birth: 1961/04/06 Referring Provider (PT): Tania Ade, MD   Encounter Date: 11/27/2021   PT End of Session - 11/27/21 0807     Visit Number 4    Number of Visits 24    Date for PT Re-Evaluation 02/07/22    Authorization Type BCBS    PT Start Time 0803    PT Stop Time 0851    PT Time Calculation (min) 48 min    Activity Tolerance Patient tolerated treatment well    Behavior During Therapy [facility] for tasks assessed/performed             Past Medical History:  Diagnosis Date   Arthritis    Coronary artery disease    GERD (gastroesophageal reflux disease)    Hepatitis    Hep C --had to get shots   Hypertension 1/10   Impaired fasting glucose 2009   Obesity    Sleep apnea    Varicose vein     Past Surgical History:  Procedure Laterality Date   CARDIAC CATHETERIZATION     2012   CORONARY ARTERY BYPASS GRAFT  07/25/11   3 vessel   pins in right hand  1990's   4th and 5th digits-- removed 6 weeks afterwards   TOTAL KNEE ARTHROPLASTY Right 09/06/2013   Dr Mayer Camel   TOTAL KNEE ARTHROPLASTY Right 09/06/2013   Procedure: TOTAL KNEE ARTHROPLASTY;  Surgeon: Kerin Salen, MD;  Location: [place];  Service: Orthopedics;  Laterality: Right;   TOTAL KNEE ARTHROPLASTY Left 11/01/2013   DR Mayer Camel   TOTAL KNEE ARTHROPLASTY Left 11/01/2013   Procedure: TOTAL KNEE ARTHROPLASTY- LEFT;  Surgeon: Kerin Salen, MD;  Location: [place];  Service: Orthopedics;  Laterality: Left;    There were no vitals filed for this visit.   Subjective Assessment - 11/27/21 0808     Subjective Pt reports he woke up on his stomach with his LUE overhead; pain from this resolves in 30 min.    Currently in Pain? No/denies    Pain Score 0-No pain                 [facility] PT Assessment - 11/27/21 0001       Assessment   Medical Diagnosis Z98.890 (ICD-10-CM) - S/P left rotator cuff repair    Referring Provider (PT) Tania Ade, MD    Onset Date/Surgical Date 10/17/21    Hand Dominance Right    Next MD Visit 01/07/22    Prior Therapy For shoulder             [facility] Adult PT Treatment/Exercise - 11/27/21 0001       Shoulder Exercises: Supine   Flexion AAROM;10 reps    Flexion Limitations with cane, 5 sec hold at end range.  and bench press with cane x 10      Shoulder Exercises: Standing   External Rotation AAROM;Left;10 reps   cane   Internal Rotation AAROM;Left;10 reps   cane behind back   Extension AAROM;Both;10 reps   cane   Other Standing Exercises scap squeeze x 5 sec x 10 reps; gentle L's x 5 sec x 10 reps      Vasopneumatic   Number Minutes Vasopneumatic  10 minutes    Vasopnuematic Location  Shoulder    Vasopneumatic Pressure  Medium    Vasopneumatic Temperature  34      Manual Therapy   Manual Therapy Soft tissue mobilization;Passive ROM;Scapular mobilization    Soft tissue mobilization Lt rhomboids, levator, upper trap, pec major.    Scapular Mobilization Lt scapula    Passive ROM PROM of Lt shoulder into flexion, scaption, ER                          PT Long Term Goals - 11/15/21 1055       PT LONG TERM GOAL #1   Title Pt will be independent with HEP    Time 12    Period Weeks    Status New    Target Date 02/07/22      PT LONG TERM GOAL #2   Title Pt will improve FOTO to >=68    Baseline 42    Time 12    Period Weeks    Status New    Target Date 02/07/22      PT LONG TERM GOAL #3   Title Pt will have L=R shoulder ROM    Time 12    Period Weeks    Status New    Target Date 02/07/22      PT LONG TERM GOAL #4   Title Pt will be able to lift and carry at least 10 lbs overhead to demo improved functional shoulder strength    Time 12    Period Weeks    Status New    Target  Date 02/07/22                   Plan - 11/27/21 0816     Clinical Impression Statement Pt is 2 days away from being 6 wks s/p RTC for LUE.  He is tolerating AAROM with cane well.    Rehab Potential Good    PT Frequency 2x / week    PT Duration 12 weeks    PT Treatment/Interventions ADLs/Self Care Home Management;Aquatic Therapy;Cryotherapy;Electrical Stimulation;Iontophoresis 4mg /ml Dexamethasone;Moist Heat;Functional mobility training;Therapeutic activities;Therapeutic exercise;Balance training;Neuromuscular re-education;Manual techniques;Patient/family education;Dry needling;Passive range of motion;Scar mobilization;Taping;Vasopneumatic Device    PT Next Visit Plan progress per protocol, AAROM and PROM for Lt shoulder    PT Home Exercise Plan [email or web address]             Patient will benefit from skilled therapeutic intervention in order to improve the following deficits and impairments:  Decreased range of motion, Increased fascial restricitons, Impaired UE functional use, Pain, Hypomobility, Improper body mechanics, Decreased mobility, Decreased strength, Increased edema, Postural dysfunction  Visit Diagnosis: Acute pain of left shoulder  Stiffness of left shoulder, not elsewhere classified  Muscle weakness (generalized)  Localized edema     Problem List Patient Active Problem List   Diagnosis Date Noted   Left rotator cuff tear 05/08/2021   Wartenberg syndrome, 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   S/P right rotator cuff repair 02/04/2019   Primary osteoarthritis of both first carpometacarpal joints 06/03/2017   BPH (benign prostatic hyperplasia) 04/26/2016   Arthritis of left knee 11/01/2013   Arthritis of knee, right 09/08/2013   Obstructive sleep apnea 07/14/2013   Left Achilles tendonitis 04/23/2013   Low back pain 04/23/2013   Osteoarthritis of both knees 03/19/2013   Coronary atherosclerosis 09/06/2011   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   Status post  coronary artery bypass grafting 08/27/2011   Hyperlipidemia with target low density lipoprotein (LDL) cholesterol less than 70 mg/dL 01/10/2011   OBESITY,  UNSPECIFIED 01/09/2011   WEIGHT GAIN 01/09/2011   BACK STRAIN 10/02/2010   ADJUSTMENT DISORDER WITH DEPRESSED MOOD 03/31/2009   HYPERTENSION, BENIGN ESSENTIAL 11/21/2008   VARICOSE VEINS LOWER EXTREMITIES W/OTH COMPS 08/31/2008   IMPAIRED FASTING GLUCOSE 08/31/2008   COLONIC POLYPS 04/20/2008   GERD 03/16/2008    Shelbie Hutching 11/27/2021, 12:49 PM  [facility] [place], [name], [address] Phone: [phone]   Fax:  [phone]  Name: BENTON TOOKER MRN: [id number] Date of Birth: 10-24-1961

## 2021-11-29 ENCOUNTER — Ambulatory Visit: Payer: BC Managed Care – PPO | Admitting: Physical Therapy

## 2021-11-29 ENCOUNTER — Other Ambulatory Visit: Payer: Self-pay

## 2021-11-29 DIAGNOSIS — M25612 Stiffness of left shoulder, not elsewhere classified: Secondary | ICD-10-CM | POA: Diagnosis not present

## 2021-11-29 DIAGNOSIS — M25512 Pain in left shoulder: Secondary | ICD-10-CM

## 2021-11-29 DIAGNOSIS — M6281 Muscle weakness (generalized): Secondary | ICD-10-CM

## 2021-11-29 DIAGNOSIS — R600 Localized edema: Secondary | ICD-10-CM

## 2021-11-29 DIAGNOSIS — M542 Cervicalgia: Secondary | ICD-10-CM | POA: Diagnosis not present

## 2021-11-29 DIAGNOSIS — Z9889 Other specified postprocedural states: Secondary | ICD-10-CM | POA: Diagnosis not present

## 2021-11-29 NOTE — Therapy (Signed)
[employer] [place], [name], [address] Phone: [phone]   Fax:  [phone]  Physical Therapy Treatment  Patient Details  Name: Bobby White MRN: [id number] Date of Birth: 07/05/1961 Referring Provider (PT): Tania Ade, MD   Encounter Date: 11/29/2021   PT End of Session - 11/29/21 0850     Visit Number 5    Number of Visits 24    Date for PT Re-Evaluation 02/07/22    Authorization Type BCBS    PT Start Time [phone]    PT Stop Time 0930    PT Time Calculation (min) 43 min    Activity Tolerance Patient tolerated treatment well    Behavior During Therapy [facility] for tasks assessed/performed             Past Medical History:  Diagnosis Date   Arthritis    Coronary artery disease    GERD (gastroesophageal reflux disease)    Hepatitis    Hep C --had to get shots   Hypertension 1/10   Impaired fasting glucose 2009   Obesity    Sleep apnea    Varicose vein     Past Surgical History:  Procedure Laterality Date   CARDIAC CATHETERIZATION     2012   CORONARY ARTERY BYPASS GRAFT  07/25/11   3 vessel   pins in right hand  1990's   4th and 5th digits-- removed 6 weeks afterwards   TOTAL KNEE ARTHROPLASTY Right 09/06/2013   Dr Mayer Camel   TOTAL KNEE ARTHROPLASTY Right 09/06/2013   Procedure: TOTAL KNEE ARTHROPLASTY;  Surgeon: Kerin Salen, MD;  Location: [place];  Service: Orthopedics;  Laterality: Right;   TOTAL KNEE ARTHROPLASTY Left 11/01/2013   DR Mayer Camel   TOTAL KNEE ARTHROPLASTY Left 11/01/2013   Procedure: TOTAL KNEE ARTHROPLASTY- LEFT;  Surgeon: Kerin Salen, MD;  Location: [place];  Service: Orthopedics;  Laterality: Left;    There were no vitals filed for this visit.   Subjective Assessment - 11/29/21 0854     Subjective Reports no issues. States he can tell his shoulder ROM is improving.    Limitations Lifting;House hold activities    How long can you sit comfortably? n/a    How long  can you stand comfortably? n/a    How long can you walk comfortably? n/a    Patient Stated Goals Improve shoulder motion    Currently in Pain? No/denies                [facility] PT Assessment - 11/29/21 0001       Assessment   Medical Diagnosis Z98.890 (ICD-10-CM) - S/P left rotator cuff repair    Referring Provider (PT) Tania Ade, MD    Onset Date/Surgical Date 10/17/21    Hand Dominance Right                           [facility] Adult PT Treatment/Exercise - 11/29/21 0001       Shoulder Exercises: Sidelying   Other Sidelying Exercises scapular retraction, depression 2x10      Shoulder Exercises: Standing   External Rotation Strengthening;10 reps    External Rotation Limitations reactive isometrics    Internal Rotation Strengthening;10 reps    Internal Rotation Limitations reactive isometrics    Flexion Strengthening;10 reps    Flexion Limitations reactive isometrics    ABduction Strengthening;10 reps    ABduction Limitations reactive isometrics    Row Strengthening;10  reps    Row Limitations reactive isometrics      Shoulder Exercises: ROM/Strengthening   Other ROM/Strengthening Exercises Finger wall crawl flexion, abduction x5 reps each      Vasopneumatic   Number Minutes Vasopneumatic  10 minutes    Vasopnuematic Location  Shoulder    Vasopneumatic Pressure Medium    Vasopneumatic Temperature  34      Manual Therapy   Scapular Mobilization Lt scapula    Passive ROM PROM of Lt shoulder into flexion, scaption, ER                          PT Long Term Goals - 11/15/21 1055       PT LONG TERM GOAL #1   Title Pt will be independent with HEP    Time 12    Period Weeks    Status New    Target Date 02/07/22      PT LONG TERM GOAL #2   Title Pt will improve FOTO to >=68    Baseline 42    Time 12    Period Weeks    Status New    Target Date 02/07/22      PT LONG TERM GOAL #3   Title Pt will have L=R shoulder ROM     Time 12    Period Weeks    Status New    Target Date 02/07/22      PT LONG TERM GOAL #4   Title Pt will be able to lift and carry at least 10 lbs overhead to demo improved functional shoulder strength    Time 12    Period Weeks    Status New    Target Date 02/07/22                   Plan - 11/29/21 0902     Clinical Impression Statement Continued to work [employer]. Initiated isometrics. Pt is currently 6 weeks. PT will follow protocol accordingly. He is continuing to progress towards his goals.    Personal Factors and Comorbidities Age;Time since onset of injury/illness/exacerbation    Examination-Activity Limitations Carry;Caring for Others;Lift;Toileting;Reach Overhead    Examination-Participation Restrictions Cleaning;Community Activity;Occupation;Yard Work    Rehab Potential Good    PT Frequency 2x / week    PT Duration 12 weeks    PT Treatment/Interventions ADLs/Self Care Home Management;Aquatic Therapy;Cryotherapy;Electrical Stimulation;Iontophoresis 4mg /ml Dexamethasone;Moist Heat;Functional mobility training;Therapeutic activities;Therapeutic exercise;Balance training;Neuromuscular re-education;Manual techniques;Patient/family education;Dry needling;Passive range of motion;Scar mobilization;Taping;Vasopneumatic Device    PT Next Visit Plan progress per protocol, AAROM, PROM, isometrics for Lt shoulder    PT Home Exercise Plan [email or web address]    Consulted and Agree with Plan of Care Patient             Patient will benefit from skilled therapeutic intervention in order to improve the following deficits and impairments:  Decreased range of motion, Increased fascial restricitons, Impaired UE functional use, Pain, Hypomobility, Improper body mechanics, Decreased mobility, Decreased strength, Increased edema, Postural dysfunction  Visit Diagnosis: Acute pain of left shoulder  Stiffness of left shoulder, not elsewhere classified  Muscle weakness (generalized)  Localized  edema     Problem List Patient Active Problem List   Diagnosis Date Noted   Left rotator cuff tear 05/08/2021   Wartenberg syndrome, 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   S/P right rotator cuff repair 02/04/2019   Primary osteoarthritis of both first carpometacarpal joints 06/03/2017   BPH (benign prostatic hyperplasia) 04/26/2016   Arthritis  of left knee 11/01/2013   Arthritis of knee, right 09/08/2013   Obstructive sleep apnea 07/14/2013   Left Achilles tendonitis 04/23/2013   Low back pain 04/23/2013   Osteoarthritis of both knees 03/19/2013   Coronary atherosclerosis 09/06/2011   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   Status post coronary artery bypass grafting 08/27/2011   Hyperlipidemia with target low density lipoprotein (LDL) cholesterol less than 70 mg/dL 01/10/2011   OBESITY, UNSPECIFIED 01/09/2011   WEIGHT GAIN 01/09/2011   BACK STRAIN 10/02/2010   ADJUSTMENT DISORDER WITH DEPRESSED MOOD 03/31/2009   HYPERTENSION, BENIGN ESSENTIAL 11/21/2008   VARICOSE VEINS LOWER EXTREMITIES W/OTH COMPS 08/31/2008   IMPAIRED FASTING GLUCOSE 08/31/2008   COLONIC POLYPS 04/20/2008   GERD 03/16/2008    Verl Whitmore April Ma L Hamp Moreland, PT, DPT 11/29/2021, 9:13 AM  [facility] [place], [name], [address] Phone: [phone]   Fax:  [phone]  Name: MJ WILLIS MRN: [id number] Date of Birth: October 31, 1961

## 2021-12-01 ENCOUNTER — Other Ambulatory Visit: Payer: Self-pay | Admitting: Sports Medicine

## 2021-12-01 DIAGNOSIS — G5630 Lesion of radial nerve, unspecified upper limb: Secondary | ICD-10-CM

## 2021-12-04 ENCOUNTER — Ambulatory Visit: Payer: BC Managed Care – PPO | Admitting: Rehabilitative and Restorative Service Providers"

## 2021-12-04 ENCOUNTER — Encounter: Payer: Self-pay | Admitting: Rehabilitative and Restorative Service Providers"

## 2021-12-04 ENCOUNTER — Other Ambulatory Visit: Payer: Self-pay

## 2021-12-04 DIAGNOSIS — R600 Localized edema: Secondary | ICD-10-CM

## 2021-12-04 DIAGNOSIS — M25512 Pain in left shoulder: Secondary | ICD-10-CM

## 2021-12-04 DIAGNOSIS — M6281 Muscle weakness (generalized): Secondary | ICD-10-CM

## 2021-12-04 DIAGNOSIS — M25612 Stiffness of left shoulder, not elsewhere classified: Secondary | ICD-10-CM

## 2021-12-04 DIAGNOSIS — Z9889 Other specified postprocedural states: Secondary | ICD-10-CM | POA: Diagnosis not present

## 2021-12-04 DIAGNOSIS — M542 Cervicalgia: Secondary | ICD-10-CM | POA: Diagnosis not present

## 2021-12-04 NOTE — Therapy (Signed)
[place], [name], [address] Phone: [phone]   Fax:  [phone]  Physical Therapy Treatment  Patient Details  Name: Bobby White MRN: [id number] Date of Birth: 1961-06-01 Referring Provider (PT): Tania Ade, MD   Encounter Date: 12/04/2021   PT End of Session - 12/04/21 1024     Visit Number 6    Number of Visits 24    Date for PT Re-Evaluation 02/07/22    Authorization Type BCBS    PT Start Time [phone]    PT Stop Time 1029    PT Time Calculation (min) 53 min    Activity Tolerance Patient tolerated treatment well    Behavior During Therapy [facility] for tasks assessed/performed             Past Medical History:  Diagnosis Date   Arthritis    Coronary artery disease    GERD (gastroesophageal reflux disease)    Hepatitis    Hep C --had to get shots   Hypertension 1/10   Impaired fasting glucose 2009   Obesity    Sleep apnea    Varicose vein     Past Surgical History:  Procedure Laterality Date   CARDIAC CATHETERIZATION     2012   CORONARY ARTERY BYPASS GRAFT  07/25/11   3 vessel   pins in right hand  1990's   4th and 5th digits-- removed 6 weeks afterwards   TOTAL KNEE ARTHROPLASTY Right 09/06/2013   Dr Mayer Camel   TOTAL KNEE ARTHROPLASTY Right 09/06/2013   Procedure: TOTAL KNEE ARTHROPLASTY;  Surgeon: Kerin Salen, MD;  Location: [place];  Service: Orthopedics;  Laterality: Right;   TOTAL KNEE ARTHROPLASTY Left 11/01/2013   DR Mayer Camel   TOTAL KNEE ARTHROPLASTY Left 11/01/2013   Procedure: TOTAL KNEE ARTHROPLASTY- LEFT;  Surgeon: Kerin Salen, MD;  Location: [place];  Service: Orthopedics;  Laterality: Left;    There were no vitals filed for this visit.   Subjective Assessment - 12/04/21 0948     Subjective The patient notes he didn't sleep well.  He does find he sometimes tries to load his L UE during sleep by rolling onto his stomach and propping on his elbow.  This wakes  him due to pain.  He knows to avoid loading when awake.    Patient Stated Goals Improve shoulder motion    Currently in Pain? No/denies                [facility] PT Assessment - 12/04/21 0949       Assessment   Medical Diagnosis Z98.890 (ICD-10-CM) - S/P left rotator cuff repair    Referring Provider (PT) Tania Ade, MD    Onset Date/Surgical Date 10/17/21      Precautions   Precautions Shoulder    Precaution Comments No lifting, no supporting body weight with hands/arms, no sudden jerking motions, no excessive behind the back movements, avoid UE bike      ROM / Strength   AROM / PROM / Strength PROM;AROM      PROM   Left Shoulder Flexion 160 Degrees    Left Shoulder ABduction 98 Degrees    Left Shoulder External Rotation 56 Degrees                           [facility] Adult PT Treatment/Exercise - 12/04/21 1001       Shoulder Exercises: Supine   Flexion AAROM;AROM;Left  Flexion Limitations with PT support reducing help and moving into AROm scaption with minimal guidance    ABduction PROM;AAROM    ABduction Limitations measured PROM today staying in pain free ROM, gentle AAROM guided by PT with patient assist    Other Supine Exercises scaption      Shoulder Exercises: Sidelying   Other Sidelying Exercises scapular retraction, depression 2x10      Shoulder Exercises: Standing   External Rotation Strengthening;Left    External Rotation Limitations reactive isometrics x 5 reps with red band    Internal Rotation Strengthening;Left    Internal Rotation Limitations reactive isometrics x 10 reps    ABduction Strengthening    ABduction Limitations isometrica into towel at doorframe with cues to reduce resistance    Other Standing Exercises scapular squeeze x 5 seconds x 10 reps      Shoulder Exercises: Isometric Strengthening   Extension Supine    Extension Limitations to neutral with tactile cues/resistance from therapist    External Rotation Supine     External Rotation Limitations gentle (2 finger) resistance    Internal Rotation Supine    Internal Rotation Limitations gentle (2 finger) resistance    ABduction --   standing   ABduction Limitations into towel with elbow flexed in door frame with tactile cues to do minimal contraction      Vasopneumatic   Number Minutes Vasopneumatic  10 minutes    Vasopnuematic Location  Shoulder    Vasopneumatic Pressure Medium    Vasopneumatic Temperature  34      Manual Therapy   Scapular Mobilization Lt scapula    Passive ROM PROM of Lt shoulder into flexion, scaption, ER                          PT Long Term Goals - 11/15/21 1055       PT LONG TERM GOAL #1   Title Pt will be independent with HEP    Time 12    Period Weeks    Status New    Target Date 02/07/22      PT LONG TERM GOAL #2   Title Pt will improve FOTO to >=68    Baseline 42    Time 12    Period Weeks    Status New    Target Date 02/07/22      PT LONG TERM GOAL #3   Title Pt will have L=R shoulder ROM    Time 12    Period Weeks    Status New    Target Date 02/07/22      PT LONG TERM GOAL #4   Title Pt will be able to lift and carry at least 10 lbs overhead to demo improved functional shoulder strength    Time 12    Period Weeks    Status New    Target Date 02/07/22                   Plan - 12/04/21 1024     Clinical Impression Statement The patient is tolerating continued AAROM to AROM today and isometrics (supine and reactive isometrics).  He has no pain at end of session.  During therapy with AAROM he noted minimal lateral discomfort (middle deltoid) at end of his range.  PT modified to patient needs.  Reviewed not weight bearing or lifting with the L UE.    PT Treatment/Interventions ADLs/Self Care Home Management;Aquatic Therapy;Cryotherapy;Electrical Stimulation;Iontophoresis 4mg /ml Dexamethasone;Moist Heat;Functional  mobility training;Therapeutic activities;Therapeutic  exercise;Balance training;Neuromuscular re-education;Manual techniques;Patient/family education;Dry needling;Passive range of motion;Scar mobilization;Taping;Vasopneumatic Device    PT Next Visit Plan progress per protocol, AAROM, PROM, isometrics for Lt shoulder    PT Home Exercise Plan [email or web address]    Consulted and Agree with Plan of Care Patient             Patient will benefit from skilled therapeutic intervention in order to improve the following deficits and impairments:     Visit Diagnosis: Acute pain of left shoulder  Stiffness of left shoulder, not elsewhere classified  Muscle weakness (generalized)  Localized edema     Problem List Patient Active Problem List   Diagnosis Date Noted   Left rotator cuff tear 05/08/2021   Wartenberg syndrome, 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   S/P right rotator cuff repair 02/04/2019   Primary osteoarthritis of both first carpometacarpal joints 06/03/2017   BPH (benign prostatic hyperplasia) 04/26/2016   Arthritis of left knee 11/01/2013   Arthritis of knee, right 09/08/2013   Obstructive sleep apnea 07/14/2013   Left Achilles tendonitis 04/23/2013   Low back pain 04/23/2013   Osteoarthritis of both knees 03/19/2013   Coronary atherosclerosis 09/06/2011   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   Status post coronary artery bypass grafting 08/27/2011   Hyperlipidemia with target low density lipoprotein (LDL) cholesterol less than 70 mg/dL 01/10/2011   OBESITY, UNSPECIFIED 01/09/2011   WEIGHT GAIN 01/09/2011   BACK STRAIN 10/02/2010   ADJUSTMENT DISORDER WITH DEPRESSED MOOD 03/31/2009   HYPERTENSION, BENIGN ESSENTIAL 11/21/2008   VARICOSE VEINS LOWER EXTREMITIES W/OTH COMPS 08/31/2008   IMPAIRED FASTING GLUCOSE 08/31/2008   COLONIC POLYPS 04/20/2008   GERD 03/16/2008    Edwyna Dangerfield, PT 12/04/2021, 2:05 PM  [facility] [place] [address] [place], [name], [address] Phone:  [phone]   Fax:  [phone]  Name: Bobby White MRN: [id number] Date of Birth: 12-13-1960

## 2021-12-06 ENCOUNTER — Other Ambulatory Visit: Payer: Self-pay

## 2021-12-06 ENCOUNTER — Ambulatory Visit: Payer: BC Managed Care – PPO | Admitting: Physical Therapy

## 2021-12-06 DIAGNOSIS — M25512 Pain in left shoulder: Secondary | ICD-10-CM | POA: Diagnosis not present

## 2021-12-06 DIAGNOSIS — Z9889 Other specified postprocedural states: Secondary | ICD-10-CM | POA: Diagnosis not present

## 2021-12-06 DIAGNOSIS — M25612 Stiffness of left shoulder, not elsewhere classified: Secondary | ICD-10-CM | POA: Diagnosis not present

## 2021-12-06 DIAGNOSIS — M542 Cervicalgia: Secondary | ICD-10-CM | POA: Diagnosis not present

## 2021-12-06 DIAGNOSIS — M6281 Muscle weakness (generalized): Secondary | ICD-10-CM

## 2021-12-06 DIAGNOSIS — R600 Localized edema: Secondary | ICD-10-CM

## 2021-12-06 NOTE — Therapy (Signed)
[place], [name], [address] Phone: [phone]   Fax:  [phone]  Physical Therapy Treatment  Patient Details  Name: [name] MRN: [id number] Date of Birth: Aug 22, 1961 Referring Provider (PT): Tania Ade, MD   Encounter Date: 12/06/2021   PT End of Session - 12/06/21 0847     Visit Number 7    Number of Visits 24    Date for PT Re-Evaluation 02/07/22    Authorization Type BCBS    PT Start Time 0845    PT Stop Time 0930    PT Time Calculation (min) 45 min    Activity Tolerance Patient tolerated treatment well    Behavior During Therapy [facility] for tasks assessed/performed             Past Medical History:  Diagnosis Date   Arthritis    Coronary artery disease    GERD (gastroesophageal reflux disease)    Hepatitis    Hep C --had to get shots   Hypertension 1/10   Impaired fasting glucose 2009   Obesity    Sleep apnea    Varicose vein     Past Surgical History:  Procedure Laterality Date   CARDIAC CATHETERIZATION     2012   CORONARY ARTERY BYPASS GRAFT  07/25/11   3 vessel   pins in right hand  1990's   4th and 5th digits-- removed 6 weeks afterwards   TOTAL KNEE ARTHROPLASTY Right 09/06/2013   Dr Mayer Camel   TOTAL KNEE ARTHROPLASTY Right 09/06/2013   Procedure: TOTAL KNEE ARTHROPLASTY;  Surgeon: Kerin Salen, MD;  Location: [place];  Service: Orthopedics;  Laterality: Right;   TOTAL KNEE ARTHROPLASTY Left 11/01/2013   DR Mayer Camel   TOTAL KNEE ARTHROPLASTY Left 11/01/2013   Procedure: TOTAL KNEE ARTHROPLASTY- LEFT;  Surgeon: Kerin Salen, MD;  Location: [place];  Service: Orthopedics;  Laterality: Left;    There were no vitals filed for this visit.   Subjective Assessment - 12/06/21 0848     Subjective Pt reports nothing new or different.    Limitations Lifting;House hold activities    How long can you sit comfortably? n/a    How long can you stand comfortably? n/a     How long can you walk comfortably? n/a    Patient Stated Goals Improve shoulder motion    Currently in Pain? No/denies                [facility] PT Assessment - 12/06/21 0001       Assessment   Medical Diagnosis Z98.890 (ICD-10-CM) - S/P left rotator cuff repair    Referring Provider (PT) Tania Ade, MD    Onset Date/Surgical Date 10/17/21                           [facility] Adult PT Treatment/Exercise - 12/06/21 0001       Shoulder Exercises: Supine   External Rotation AROM;10 reps    Internal Rotation AROM;10 reps    Flexion AROM;Left;10 reps    ABduction AROM;Left;10 reps      Shoulder Exercises: Sidelying   External Rotation AROM;10 reps    Flexion AROM;10 reps    ABduction AROM;10 reps      Shoulder Exercises: Standing   External Rotation Strengthening;Left    Theraband Level (Shoulder External Rotation) Level 2 (Red)    External Rotation Limitations reactive isometrics x 5 reps with red band  Internal Rotation Strengthening;Left    Theraband Level (Shoulder Internal Rotation) Level 2 (Red)    Internal Rotation Limitations reactive isometrics x 10 reps    Flexion Strengthening    Theraband Level (Shoulder Flexion) Level 2 (Red)    Flexion Limitations reactive isometrics    ABduction Strengthening    Theraband Level (Shoulder ABduction) Level 2 (Red)    ABduction Limitations reactive isometrics    Extension Strengthening;10 reps;Theraband    Extension Limitations reactive isometrics      Shoulder Exercises: Pulleys   Flexion 1 minute    ABduction 1 minute      Shoulder Exercises: ROM/Strengthening   Other ROM/Strengthening Exercises Finger wall crawl flexion, abduction x5 reps each      Vasopneumatic   Number Minutes Vasopneumatic  10 minutes    Vasopnuematic Location  Shoulder    Vasopneumatic Pressure Medium    Vasopneumatic Temperature  34      Manual Therapy   Passive ROM PROM of Lt shoulder into flexion, scaption, ER                           PT Long Term Goals - 11/15/21 1055       PT LONG TERM GOAL #1   Title Pt will be independent with HEP    Time 12    Period Weeks    Status New    Target Date 02/07/22      PT LONG TERM GOAL #2   Title Pt will improve FOTO to >=68    Baseline 42    Time 12    Period Weeks    Status New    Target Date 02/07/22      PT LONG TERM GOAL #3   Title Pt will have L=R shoulder ROM    Time 12    Period Weeks    Status New    Target Date 02/07/22      PT LONG TERM GOAL #4   Title Pt will be able to lift and carry at least 10 lbs overhead to demo improved functional shoulder strength    Time 12    Period Weeks    Status New    Target Date 02/07/22                   Plan - 12/06/21 0909     Clinical Impression Statement Pt is currently 7 weeks post-op. Treatment focused on continuing AAROM and AROM. Continued to work on reactive isometrics. Pt does not endorse any pain after exercises.    Personal Factors and Comorbidities Age;Time since onset of injury/illness/exacerbation    Examination-Activity Limitations Carry;Caring for Others;Lift;Toileting;Reach Overhead    Examination-Participation Restrictions Cleaning;Community Activity;Occupation;Yard Work    Rehab Potential Good    PT Frequency 2x / week    PT Duration 12 weeks    PT Treatment/Interventions ADLs/Self Care Home Management;Aquatic Therapy;Cryotherapy;Electrical Stimulation;Iontophoresis 4mg /ml Dexamethasone;Moist Heat;Functional mobility training;Therapeutic activities;Therapeutic exercise;Balance training;Neuromuscular re-education;Manual techniques;Patient/family education;Dry needling;Passive range of motion;Scar mobilization;Taping;Vasopneumatic Device    PT Next Visit Plan progress per protocol, AAROM, PROM, isometrics for Lt shoulder    PT Home Exercise Plan [email or web address]    Consulted and Agree with Plan of Care Patient             Patient will benefit from skilled  therapeutic intervention in order to improve the following deficits and impairments:  Decreased range of motion, Increased fascial restricitons, Impaired UE functional use, Pain, Hypomobility, Improper  body mechanics, Decreased mobility, Decreased strength, Increased edema, Postural dysfunction  Visit Diagnosis: Acute pain of left shoulder  Stiffness of left shoulder, not elsewhere classified  Muscle weakness (generalized)  Localized edema     Problem List Patient Active Problem List   Diagnosis Date Noted   Left rotator cuff tear 05/08/2021   Wartenberg syndrome, 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   S/P right rotator cuff repair 02/04/2019   Primary osteoarthritis of both first carpometacarpal joints 06/03/2017   BPH (benign prostatic hyperplasia) 04/26/2016   Arthritis of left knee 11/01/2013   Arthritis of knee, right 09/08/2013   Obstructive sleep apnea 07/14/2013   Left Achilles tendonitis 04/23/2013   Low back pain 04/23/2013   Osteoarthritis of both knees 03/19/2013   Coronary atherosclerosis 09/06/2011   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   Status post coronary artery bypass grafting 08/27/2011   Hyperlipidemia with target low density lipoprotein (LDL) cholesterol less than 70 mg/dL 01/10/2011   OBESITY, UNSPECIFIED 01/09/2011   WEIGHT GAIN 01/09/2011   BACK STRAIN 10/02/2010   ADJUSTMENT DISORDER WITH DEPRESSED MOOD 03/31/2009   HYPERTENSION, BENIGN ESSENTIAL 11/21/2008   VARICOSE VEINS LOWER EXTREMITIES W/OTH COMPS 08/31/2008   IMPAIRED FASTING GLUCOSE 08/31/2008   COLONIC POLYPS 04/20/2008   GERD 03/16/2008    Jenayah Antu April Gordy Levan, PT, DPT 12/06/2021, 9:20 AM  [facility] [place], [name], [address] Phone: [phone]   Fax:  [phone]  Name: LONNIE RETH MRN: [id number] Date of Birth: 12-21-1960

## 2021-12-11 ENCOUNTER — Ambulatory Visit: Payer: BC Managed Care – PPO | Admitting: Physical Therapy

## 2021-12-11 ENCOUNTER — Other Ambulatory Visit: Payer: Self-pay

## 2021-12-11 DIAGNOSIS — Z9889 Other specified postprocedural states: Secondary | ICD-10-CM | POA: Diagnosis not present

## 2021-12-11 DIAGNOSIS — M25512 Pain in left shoulder: Secondary | ICD-10-CM

## 2021-12-11 DIAGNOSIS — M6281 Muscle weakness (generalized): Secondary | ICD-10-CM

## 2021-12-11 DIAGNOSIS — M25612 Stiffness of left shoulder, not elsewhere classified: Secondary | ICD-10-CM

## 2021-12-11 DIAGNOSIS — R600 Localized edema: Secondary | ICD-10-CM

## 2021-12-11 DIAGNOSIS — M542 Cervicalgia: Secondary | ICD-10-CM | POA: Diagnosis not present

## 2021-12-11 NOTE — Therapy (Signed)
[place], [name], [address] Phone: [phone]   Fax:  [phone]  Physical Therapy Treatment  Patient Details  Name: Bobby White MRN: [id number] Date of Birth: 09-Nov-1961 Referring Provider (PT): Tania Ade, MD   Encounter Date: 12/11/2021   PT End of Session - 12/11/21 1008     Visit Number 8    Number of Visits 24    Date for PT Re-Evaluation 02/07/22    Authorization Type BCBS    PT Start Time 0930    PT Stop Time 4481    PT Time Calculation (min) 45 min    Activity Tolerance Patient tolerated treatment well    Behavior During Therapy [facility] for tasks assessed/performed             Past Medical History:  Diagnosis Date   Arthritis    Coronary artery disease    GERD (gastroesophageal reflux disease)    Hepatitis    Hep C --had to get shots   Hypertension 1/10   Impaired fasting glucose 2009   Obesity    Sleep apnea    Varicose vein     Past Surgical History:  Procedure Laterality Date   CARDIAC CATHETERIZATION     2012   CORONARY ARTERY BYPASS GRAFT  07/25/11   3 vessel   pins in right hand  1990's   4th and 5th digits-- removed 6 weeks afterwards   TOTAL KNEE ARTHROPLASTY Right 09/06/2013   Dr Mayer Camel   TOTAL KNEE ARTHROPLASTY Right 09/06/2013   Procedure: TOTAL KNEE ARTHROPLASTY;  Surgeon: Kerin Salen, MD;  Location: [place];  Service: Orthopedics;  Laterality: Right;   TOTAL KNEE ARTHROPLASTY Left 11/01/2013   DR Mayer Camel   TOTAL KNEE ARTHROPLASTY Left 11/01/2013   Procedure: TOTAL KNEE ARTHROPLASTY- LEFT;  Surgeon: Kerin Salen, MD;  Location: [place];  Service: Orthopedics;  Laterality: Left;    There were no vitals filed for this visit.   Subjective Assessment - 12/11/21 0933     Subjective Pt states he slept on his arm last night so it is "a little sore"    Patient Stated Goals Improve shoulder motion    Currently in Pain? Yes    Pain Score 2     Pain  Location Shoulder    Pain Orientation Left    Pain Descriptors / Indicators Sore                [facility] PT Assessment - 12/11/21 0001       Assessment   Medical Diagnosis Z98.890 (ICD-10-CM) - S/P left rotator cuff repair    Referring Provider (PT) Tania Ade, MD    Onset Date/Surgical Date 10/17/21      PROM   Left Shoulder Flexion 165 Degrees                           [facility] Adult PT Treatment/Exercise - 12/11/21 0001       Shoulder Exercises: Supine   Protraction Left;20 reps    Protraction Limitations serratus punch    Flexion AROM;10 reps;Left      Shoulder Exercises: Sidelying   External Rotation AROM;10 reps    Flexion AROM;10 reps    ABduction AROM;10 reps      Shoulder Exercises: Standing   External Rotation Strengthening;10 reps    Theraband Level (Shoulder External Rotation) Level 2 (Red)    External Rotation Limitations reactive isometric  Internal Rotation Strengthening;10 reps    Theraband Level (Shoulder Internal Rotation) Level 2 (Red)    Internal Rotation Limitations reactive isometric    Flexion Strengthening;10 reps    Theraband Level (Shoulder Flexion) Level 2 (Red)    Flexion Limitations reactive isometric    ABduction Strengthening;10 reps    Theraband Level (Shoulder ABduction) Level 2 (Red)    ABduction Limitations reactive isometrics    Extension Strengthening;10 reps    Theraband Level (Shoulder Extension) Level 2 (Red)    Extension Limitations reactive isometric      Shoulder Exercises: Pulleys   Flexion 1 minute    ABduction 1 minute      Shoulder Exercises: ROM/Strengthening   Ball on Wall CW/CCW 2 x 30 sec each way      Vasopneumatic   Number Minutes Vasopneumatic  10 minutes    Vasopnuematic Location  Shoulder    Vasopneumatic Pressure Medium    Vasopneumatic Temperature  34      Manual Therapy   Passive ROM PROM Lt shoulder flexion, ER, scaption                          PT Long  Term Goals - 11/15/21 1055       PT LONG TERM GOAL #1   Title Pt will be independent with HEP    Time 12    Period Weeks    Status New    Target Date 02/07/22      PT LONG TERM GOAL #2   Title Pt will improve FOTO to >=68    Baseline 42    Time 12    Period Weeks    Status New    Target Date 02/07/22      PT LONG TERM GOAL #3   Title Pt will have L=R shoulder ROM    Time 12    Period Weeks    Status New    Target Date 02/07/22      PT LONG TERM GOAL #4   Title Pt will be able to lift and carry at least 10 lbs overhead to demo improved functional shoulder strength    Time 12    Period Weeks    Status New    Target Date 02/07/22                   Plan - 12/11/21 1008     Clinical Impression Statement Pt is improving ROM and activity tolerance. He tolerated the addition of ball on wall activity with no increase in symptoms    PT Next Visit Plan progress per protocol. 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   S/P right rotator cuff repair 02/04/2019   Primary osteoarthritis of both first carpometacarpal joints 06/03/2017   BPH (benign prostatic hyperplasia) 04/26/2016   Arthritis of left knee 11/01/2013   Arthritis of knee, right 09/08/2013  Obstructive sleep apnea 07/14/2013   Left Achilles tendonitis 04/23/2013   Low back pain 04/23/2013   Osteoarthritis of both knees 03/19/2013   Coronary atherosclerosis 09/06/2011   Postsurgical  aortocoronary bypass status 09/06/2011   Status post coronary artery bypass grafting 08/27/2011   Hyperlipidemia with target low density lipoprotein (LDL) cholesterol less than 70 mg/dL 01/10/2011   OBESITY, UNSPECIFIED 01/09/2011   WEIGHT GAIN 01/09/2011   BACK STRAIN 10/02/2010   ADJUSTMENT DISORDER WITH DEPRESSED MOOD 03/31/2009   HYPERTENSION, BENIGN ESSENTIAL 11/21/2008   VARICOSE VEINS LOWER EXTREMITIES W/OTH COMPS 08/31/2008   IMPAIRED FASTING GLUCOSE 08/31/2008   COLONIC POLYPS 04/20/2008   GERD 03/16/2008    Amare Bail, PT 12/11/2021, 10:11 AM  [facility] [place] [address] [place], [name], [address] Phone: [phone]   Fax:  [phone]  Name: Bobby White MRN: [id number] Date of Birth: 1961-10-03

## 2021-12-12 NOTE — Progress Notes (Signed)
Established Patient Office Visit  Subjective:  Patient ID: Bobby White, male    DOB: Jul 03, 1961  Age: 61 y.o. MRN: [id number]  CC:  Chief Complaint  Patient presents with   Hypertension    Follow up     HPI Bobby White presents for   Hypertension- Pt denies chest pain, SOB, dizziness, or heart palpitations.  Taking meds as directed w/o problems.  Denies medication side effects.    Impaired fasting glucose-no increased thirst or urination. No symptoms consistent with hypoglycemia.  He had left shoulder surgery/rotator cuff repair in November he is actively going to physical therapy and will still be out of work for another month or 2.  He has also been having some right-sided neck pain.  He was sleeping with his sling on in his CPAP and somehow tweaked his neck.  And ever since then has had pain and discomfort he has been using diclofenac and taking his amitriptyline at night.  He did go to urgent care and had an x-ray showing that he had some degenerative disc disease at C5-6 and 7.  Past Medical History:  Diagnosis Date   Arthritis    Coronary artery disease    GERD (gastroesophageal reflux disease)    Hepatitis    Hep C --had to get shots   Hypertension 1/10   Impaired fasting glucose 2009   Obesity    Sleep apnea    Varicose vein     Past Surgical History:  Procedure Laterality Date   CARDIAC CATHETERIZATION     2012   CORONARY ARTERY BYPASS GRAFT  07/25/11   3 vessel   pins in right hand  1990's   4th and 5th digits-- removed 6 weeks afterwards   TOTAL KNEE ARTHROPLASTY Right 09/06/2013   Dr Mayer Camel   TOTAL KNEE ARTHROPLASTY Right 09/06/2013   Procedure: TOTAL KNEE ARTHROPLASTY;  Surgeon: Kerin Salen, MD;  Location: [place];  Service: Orthopedics;  Laterality: Right;   TOTAL KNEE ARTHROPLASTY Left 11/01/2013   DR Mayer Camel   TOTAL KNEE ARTHROPLASTY Left 11/01/2013   Procedure: TOTAL KNEE ARTHROPLASTY- LEFT;  Surgeon: Kerin Salen, MD;  Location: [place];   Service: Orthopedics;  Laterality: Left;    Family History  Problem Relation Age of Onset   Diabetes Mother    Cancer Father        pancreatic    Social History   Socioeconomic History   Marital status: Married    Spouse name: Not on file   Number of children: Not on file   Years of education: Not on file   Highest education level: Not on file  Occupational History   Not on file  Tobacco Use   Smoking status: Former    Packs/day: 1.50    Years: 15.00    Pack years: 22.50    Types: Cigarettes    Quit date: 11/18/1996    Years since quitting: 25.0   Smokeless tobacco: Never  Vaping Use   Vaping Use: Former  Substance and Sexual Activity   Alcohol use: Yes    Comment: socially   Drug use: Yes    Types: Marijuana    Comment: last time 2012 after his heart surgery   Sexual activity: Not on file  Other Topics Concern   Not on file  Social History Narrative   Not on file   Social Determinants of Health   Financial Resource Strain: Not on file  Food Insecurity: Not on file  Transportation Needs: Not on file  Physical Activity: Not on file  Stress: Not on file  Social Connections: Not on file  Intimate Partner Violence: Not on file    Outpatient Medications Prior to Visit  Medication Sig Dispense Refill   Albuterol Sulfate (PROAIR RESPICLICK) 119 (90 Base) MCG/ACT AEPB 1 puff as needed     AMBULATORY NON FORMULARY MEDICATION Take 25 mg by mouth daily. Medication Name: DIM 25 mg     amitriptyline (ELAVIL) 75 MG tablet TAKE 1 TABLET BY MOUTH AT BEDTIME 30 tablet 0   aspirin EC 325 MG tablet Take 1 tablet (325 mg total) by mouth 2 (two) times daily. (Patient taking differently: Take 81 mg by mouth once. Patient currently taking 81 mg daily) 30 tablet 0   atorvastatin (LIPITOR) 40 MG tablet Take 1 tablet (40 mg total) by mouth daily. 90 tablet 3   b complex vitamins tablet Take 4 tablets by mouth daily.     baclofen (LIORESAL) 10 MG tablet Take 1 tablet (10 mg total) by  mouth 3 (three) times daily. 30 each 0   Chondroitin Sulfate 400 MG CAPS See admin instructions.     Cinnamon 500 MG capsule Take 500 mg by mouth 2 (two) times daily.     DHEA 25 MG CAPS Take 25 mg by mouth daily.     diclofenac (VOLTAREN) 75 MG EC tablet TAKE 1 TABLET BY MOUTH TWICE DAILY 180 tablet 3   fluticasone (FLONASE) 50 MCG/ACT nasal spray Place into the nose.     Ginger, Zingiber officinalis, (GINGER ROOT) 250 MG CAPS Take by mouth.     GLUCOMANNAN KONJAC ROOT PO Take 1,500 mg by mouth daily.     KRILL OIL PO Take 1 capsule by mouth daily.     LINIMENTS EX Apply topically.     metoprolol succinate (TOPROL-XL) 25 MG 24 hr tablet TAKE 1 TABLET BY MOUTH DAILY GENERIC EQUIVALENT FOR TOPROX- XL 90 tablet 1   Misc. Devices MISC New Bipap mask, hose, and humidity chamber for OSA on Bipap therapy.  Send to the current DME.     Multiple Vitamins-Minerals (MULTIVITAMIN WITH MINERALS) tablet Take 1 tablet by mouth daily.     Omega-3 Fatty Acids (FISH OIL) 1000 MG CAPS Take 1,000 mg by mouth daily.     omeprazole (PRILOSEC) 40 MG capsule TAKE 1 CAPSULE DAILY 90 capsule 3   pyridOXINE (VITAMIN B-6) 50 MG tablet Take 1 tablet (50 mg total) by mouth daily. 90 tablet 3   sildenafil (REVATIO) 20 MG tablet 1 tablet     tamsulosin (FLOMAX) 0.4 MG CAPS capsule TAKE 2 CAPSULES BY MOUTH DAILY 180 capsule 3   Turmeric 500 MG CAPS Take 1 capsule by mouth daily.     amLODipine (NORVASC) 10 MG tablet Take 1 tablet by mouth daily.     HYDROcodone-acetaminophen (NORCO) 10-325 MG tablet Take 1 tablet by mouth every 6 (six) hours as needed. 30 tablet 0   nitroGLYCERIN (NITROSTAT) 0.4 MG SL tablet Place 1 tablet (0.4 mg total) under the tongue every 5 (five) minutes as needed for chest pain. 12 tablet prn   No facility-administered medications prior to visit.    Allergies  Allergen Reactions   Poison Ivy Extract Itching, Rash and Other (See Comments)   Ace Inhibitors Other (See Comments)    REACTION: cough    Sulfamethoxazole-Trimethoprim     Other reaction(s): hives    ROS Review of Systems    Objective:  Physical Exam Constitutional:      Appearance: Normal appearance. He is well-developed.  HENT:     Head: Normocephalic and atraumatic.  Cardiovascular:     Rate and Rhythm: Normal rate and regular rhythm.     Heart sounds: Normal heart sounds.  Pulmonary:     Effort: Pulmonary effort is normal.     Breath sounds: Normal breath sounds.  Skin:    General: Skin is warm and dry.  Neurological:     Mental Status: He is alert and oriented to person, place, and time. Mental status is at baseline.  Psychiatric:        Behavior: Behavior normal.    BP 128/80    Pulse 83    Resp 18    Ht '5\' 11"'  (1.803 m)    Wt 263 lb (119.3 kg)    BMI 36.68 kg/m  Wt Readings from Last 3 Encounters:  12/13/21 263 lb (119.3 kg)  06/12/21 269 lb (122 kg)  12/13/20 266 lb (120.7 kg)     Health Maintenance Due  Topic Date Due   COLONOSCOPY (Pts 45-35yr Insurance coverage will need to be confirmed)  08/03/2020    There are no preventive care reminders to display for this patient.  Lab Results  Component Value Date   TSH 1.66 11/23/2019   TSH 1.663 11/23/2019   Lab Results  Component Value Date   WBC 5.1 06/12/2020   HGB 15.8 06/12/2020   HCT 48.4 06/12/2020   MCV 84.3 06/12/2020   PLT 239 06/12/2020   Lab Results  Component Value Date   NA 140 06/12/2021   K 5.2 06/12/2021   CO2 30 06/12/2021   GLUCOSE 103 (H) 06/12/2021   BUN 17 06/12/2021   CREATININE 0.90 06/12/2021   BILITOT 0.6 06/12/2021   ALKPHOS 95 05/19/2017   AST 21 06/12/2021   ALT 23 06/12/2021   PROT 6.6 06/12/2021   ALBUMIN 4.5 05/19/2017   CALCIUM 9.8 06/12/2021   EGFR 97 06/12/2021   Lab Results  Component Value Date   CHOL 128 06/12/2021   Lab Results  Component Value Date   HDL 38 (L) 06/12/2021   Lab Results  Component Value Date   LDLCALC 68 06/12/2021   Lab Results  Component Value Date    TRIG 141 06/12/2021   Lab Results  Component Value Date   CHOLHDL 3.4 06/12/2021   Lab Results  Component Value Date   HGBA1C 4.9 12/13/2021      Assessment & Plan:   Problem List Items Addressed This Visit       Cardiovascular and Mediastinum   HYPERTENSION, BENIGN ESSENTIAL - Primary    Well controlled. Continue current regimen. Follow up in  6 mo       Relevant Medications   amLODipine (NORVASC) 10 MG tablet     Endocrine   IMPAIRED FASTING GLUCOSE    A1c looks absolutely phenomenal today at 4.9 he is really done a great job.  He is planning on getting back into the gym more regularly once he gets released after his shoulder surgery.      Other Visit Diagnoses     Colon cancer screening       Relevant Orders   Ambulatory referral to Gastroenterology   Pre-diabetes       Relevant Orders   POCT HgB A1C (Completed)   Cervical pain       Relevant Orders   Ambulatory referral to Physical Therapy   Flu vaccine  need       Relevant Orders   Flu Vaccine QUAD 62moIM (Fluarix, Fluzone & Alfiuria Quad PF) (Completed)      Cervical pain-recommend that we have one of the physical therapist work on his neck since he is already going for her shoulder I will place a new order today.  Meds ordered this encounter  Medications   amLODipine (NORVASC) 10 MG tablet    Sig: Take 1 tablet (10 mg total) by mouth daily.    Dispense:  90 tablet    Refill:  1    Follow-up: Return in about 6 months (around 06/12/2022) for Hypertension.    CBeatrice Lecher MD

## 2021-12-13 ENCOUNTER — Encounter: Payer: Self-pay | Admitting: Family Medicine

## 2021-12-13 ENCOUNTER — Ambulatory Visit: Payer: BC Managed Care – PPO | Admitting: Physical Therapy

## 2021-12-13 ENCOUNTER — Other Ambulatory Visit: Payer: Self-pay

## 2021-12-13 ENCOUNTER — Ambulatory Visit (INDEPENDENT_AMBULATORY_CARE_PROVIDER_SITE_OTHER): Payer: BC Managed Care – PPO | Admitting: Family Medicine

## 2021-12-13 VITALS — BP 128/80 | HR 83 | Resp 18 | Ht 71.0 in | Wt 263.0 lb

## 2021-12-13 DIAGNOSIS — Z1211 Encounter for screening for malignant neoplasm of colon: Secondary | ICD-10-CM | POA: Diagnosis not present

## 2021-12-13 DIAGNOSIS — M542 Cervicalgia: Secondary | ICD-10-CM | POA: Diagnosis not present

## 2021-12-13 DIAGNOSIS — R7303 Prediabetes: Secondary | ICD-10-CM

## 2021-12-13 DIAGNOSIS — Z9889 Other specified postprocedural states: Secondary | ICD-10-CM | POA: Diagnosis not present

## 2021-12-13 DIAGNOSIS — M6281 Muscle weakness (generalized): Secondary | ICD-10-CM

## 2021-12-13 DIAGNOSIS — M25512 Pain in left shoulder: Secondary | ICD-10-CM

## 2021-12-13 DIAGNOSIS — I10 Essential (primary) hypertension: Secondary | ICD-10-CM

## 2021-12-13 DIAGNOSIS — Z23 Encounter for immunization: Secondary | ICD-10-CM

## 2021-12-13 DIAGNOSIS — R600 Localized edema: Secondary | ICD-10-CM

## 2021-12-13 DIAGNOSIS — M25612 Stiffness of left shoulder, not elsewhere classified: Secondary | ICD-10-CM | POA: Diagnosis not present

## 2021-12-13 DIAGNOSIS — R7301 Impaired fasting glucose: Secondary | ICD-10-CM

## 2021-12-13 LAB — POCT GLYCOSYLATED HEMOGLOBIN (HGB A1C): Hemoglobin A1C: 4.9 % (ref 4.0–5.6)

## 2021-12-13 MED ORDER — AMLODIPINE BESYLATE 10 MG PO TABS: 10.0000 mg | ORAL_TABLET | Freq: Every day | ORAL | 1 refills | Status: DC

## 2021-12-13 NOTE — Assessment & Plan Note (Signed)
Well controlled. Continue current regimen. Follow up in  6 mo  

## 2021-12-13 NOTE — Therapy (Signed)
[place], [name], [address] Phone: [phone]   Fax:  [phone]  Physical Therapy Treatment  Patient Details  Name: Bobby White MRN: [id number] Date of Birth: 08-Apr-1961 Referring Provider (PT): Tania Ade, MD   Encounter Date: 12/13/2021   PT End of Session - 12/13/21 0910     Visit Number 9    Number of Visits 24    Date for PT Re-Evaluation 02/07/22    Authorization Type BCBS    PT Start Time 0850    PT Stop Time 0930    PT Time Calculation (min) 40 min    Activity Tolerance Patient tolerated treatment well    Behavior During Therapy [facility] for tasks assessed/performed             Past Medical History:  Diagnosis Date   Arthritis    Coronary artery disease    GERD (gastroesophageal reflux disease)    Hepatitis    Hep C --had to get shots   Hypertension 1/10   Impaired fasting glucose 2009   Obesity    Sleep apnea    Varicose vein     Past Surgical History:  Procedure Laterality Date   CARDIAC CATHETERIZATION     2012   CORONARY ARTERY BYPASS GRAFT  07/25/11   3 vessel   pins in right hand  1990's   4th and 5th digits-- removed 6 weeks afterwards   TOTAL KNEE ARTHROPLASTY Right 09/06/2013   Dr Mayer Camel   TOTAL KNEE ARTHROPLASTY Right 09/06/2013   Procedure: TOTAL KNEE ARTHROPLASTY;  Surgeon: Kerin Salen, MD;  Location: [place];  Service: Orthopedics;  Laterality: Right;   TOTAL KNEE ARTHROPLASTY Left 11/01/2013   DR Mayer Camel   TOTAL KNEE ARTHROPLASTY Left 11/01/2013   Procedure: TOTAL KNEE ARTHROPLASTY- LEFT;  Surgeon: Kerin Salen, MD;  Location: [place];  Service: Orthopedics;  Laterality: Left;    There were no vitals filed for this visit.   Subjective Assessment - 12/13/21 0910     Subjective Reports no issues. Pt reports improving motion    Limitations Lifting;House hold activities    How long can you sit comfortably? n/a    How long can you stand  comfortably? n/a    How long can you walk comfortably? n/a    Patient Stated Goals Improve shoulder motion    Currently in Pain? Yes    Pain Score 2                 [facility] PT Assessment - 12/13/21 0001       Assessment   Medical Diagnosis Z98.890 (ICD-10-CM) - S/P left rotator cuff repair    Referring Provider (PT) Tania Ade, MD    Onset Date/Surgical Date 10/17/21      AROM   AROM Assessment Site Shoulder    Right/Left Shoulder Left   seated   Left Shoulder Flexion 120 Degrees    Left Shoulder ABduction 80 Degrees                           [facility] Adult PT Treatment/Exercise - 12/13/21 0001       Shoulder Exercises: Seated   Flexion AROM;Strengthening;Left;10 reps    Flexion Limitations x2    Abduction AROM;Left;10 reps    ABduction Limitations --      Shoulder Exercises: Prone   Other Prone Exercises prone row x 15 to neutral  Other Prone Exercises extension to neutral x 15      Shoulder Exercises: Sidelying   External Rotation AROM;10 reps    ABduction AROM;10 reps      Shoulder Exercises: Standing   External Rotation Strengthening;10 reps    Theraband Level (Shoulder External Rotation) Level 2 (Red)    External Rotation Limitations reactive isometric    Internal Rotation Strengthening;10 reps    Theraband Level (Shoulder Internal Rotation) Level 2 (Red)    Internal Rotation Limitations reactive isometric    Flexion Strengthening;10 reps    Theraband Level (Shoulder Flexion) Level 2 (Red)    Flexion Limitations reactive isometric    ABduction Strengthening;10 reps    Theraband Level (Shoulder ABduction) Level 2 (Red)    ABduction Limitations reactive isometrics      Vasopneumatic   Number Minutes Vasopneumatic  10 minutes    Vasopnuematic Location  Shoulder    Vasopneumatic Pressure Medium    Vasopneumatic Temperature  34      Manual Therapy   Passive ROM PROM Lt shoulder flexion, ER, scaption                           PT Long Term Goals - 11/15/21 1055       PT LONG TERM GOAL #1   Title Pt will be independent with HEP    Time 12    Period Weeks    Status New    Target Date 02/07/22      PT LONG TERM GOAL #2   Title Pt will improve FOTO to >=68    Baseline 42    Time 12    Period Weeks    Status New    Target Date 02/07/22      PT LONG TERM GOAL #3   Title Pt will have L=R shoulder ROM    Time 12    Period Weeks    Status New    Target Date 02/07/22      PT LONG TERM GOAL #4   Title Pt will be able to lift and carry at least 10 lbs overhead to demo improved functional shoulder strength    Time 12    Period Weeks    Status New    Target Date 02/07/22                   Plan - 12/13/21 0919     Clinical Impression Statement Treatment focused on improving AROM against gravity. Continued to work on reactive isometrics. Able to tolerate flexion to 120 against gravity, abduction limited so worked in side lying.    Personal Factors and Comorbidities Age;Time since onset of injury/illness/exacerbation    Examination-Activity Limitations Carry;Caring for Others;Lift;Toileting;Reach Overhead    Examination-Participation Restrictions Cleaning;Community Activity;Occupation;Yard Work    Stability/Clinical Decision Making Stable/Uncomplicated    Rehab Potential Good    PT Frequency 2x / week    PT Duration 12 weeks    PT Treatment/Interventions ADLs/Self Care Home Management;Aquatic Therapy;Cryotherapy;Electrical Stimulation;Iontophoresis 4mg /ml Dexamethasone;Moist Heat;Functional mobility training;Therapeutic activities;Therapeutic exercise;Balance training;Neuromuscular re-education;Manual techniques;Patient/family education;Dry needling;Passive range of motion;Scar mobilization;Taping;Vasopneumatic Device    PT Next Visit Plan progress per protocol. shoulder endurance, AROM and isometrics    PT Home Exercise Plan [email or web address]    Consulted and Agree with Plan of  Care Patient             Patient will benefit from skilled therapeutic intervention in order to improve the following deficits and  impairments:  Decreased range of motion, Increased fascial restricitons, Impaired UE functional use, Pain, Hypomobility, Improper body mechanics, Decreased mobility, Decreased strength, Increased edema, Postural dysfunction  Visit Diagnosis: Acute pain of left shoulder  Stiffness of left shoulder, not elsewhere classified  Muscle weakness (generalized)  Localized edema     Problem List Patient Active Problem List   Diagnosis Date Noted   Left rotator cuff tear 05/08/2021   Wartenberg syndrome, 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   S/P right rotator cuff repair 02/04/2019   Primary osteoarthritis of both first carpometacarpal joints 06/03/2017   BPH (benign prostatic hyperplasia) 04/26/2016   Arthritis of left knee 11/01/2013   Arthritis of knee, right 09/08/2013   Obstructive sleep apnea 07/14/2013   Left Achilles tendonitis 04/23/2013   Low back pain 04/23/2013   Osteoarthritis of both knees 03/19/2013   Coronary atherosclerosis 09/06/2011   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   Status post coronary artery bypass grafting 08/27/2011   Hyperlipidemia with target low density lipoprotein (LDL) cholesterol less than 70 mg/dL 01/10/2011   OBESITY, UNSPECIFIED 01/09/2011   WEIGHT GAIN 01/09/2011   BACK STRAIN 10/02/2010   ADJUSTMENT DISORDER WITH DEPRESSED MOOD 03/31/2009   HYPERTENSION, BENIGN ESSENTIAL 11/21/2008   VARICOSE VEINS LOWER EXTREMITIES W/OTH COMPS 08/31/2008   IMPAIRED FASTING GLUCOSE 08/31/2008   COLONIC POLYPS 04/20/2008   GERD 03/16/2008    Anna Beaird April Gordy Levan, PT, DPT 12/13/2021, 9:30 AM  [facility] [place], [name], [address] Phone: [phone]   Fax:  [phone]  Name: JERRIK HOUSHOLDER MRN: [id number] Date of Birth: June 06, 1961

## 2021-12-13 NOTE — Assessment & Plan Note (Signed)
A1c looks absolutely phenomenal today at 4.9 he is really done a great job.  He is planning on getting back into the gym more regularly once he gets released after his shoulder surgery.

## 2021-12-18 ENCOUNTER — Other Ambulatory Visit: Payer: Self-pay

## 2021-12-18 ENCOUNTER — Ambulatory Visit: Payer: BC Managed Care – PPO | Admitting: Physical Therapy

## 2021-12-18 DIAGNOSIS — M6281 Muscle weakness (generalized): Secondary | ICD-10-CM

## 2021-12-18 DIAGNOSIS — M25612 Stiffness of left shoulder, not elsewhere classified: Secondary | ICD-10-CM | POA: Diagnosis not present

## 2021-12-18 DIAGNOSIS — Z9889 Other specified postprocedural states: Secondary | ICD-10-CM | POA: Diagnosis not present

## 2021-12-18 DIAGNOSIS — M542 Cervicalgia: Secondary | ICD-10-CM

## 2021-12-18 DIAGNOSIS — M25512 Pain in left shoulder: Secondary | ICD-10-CM

## 2021-12-18 NOTE — Patient Instructions (Signed)
Access Code: [email or web address] URL: [email or web address] Date: 12/18/2021 Prepared by: Isabelle Course  Exercises Seated Assisted Cervical Rotation with Towel - 1 x daily - 7 x weekly - 1 sets - 10 reps - 10 hold Seated Upper Trapezius Stretch - 1 x daily - 7 x weekly - 1 sets - 3 reps - 20-30 seconds hold Standing Shoulder External Rotation with Resistance - 1 x daily - 7 x weekly - 2 sets - 10 reps Standing Cervical Retraction - 1 x daily - 7 x weekly - 1 sets - 10 reps

## 2021-12-18 NOTE — Therapy (Signed)
[place], [name], [address] Phone: [phone]   Fax:  [phone]  Physical Therapy Treatment and ReEvaluation  Patient Details  Name: Bobby White MRN: [id number] Date of Birth: 1961-04-18 Referring Provider (PT): chandler, justin   Encounter Date: 12/18/2021   PT End of Session - 12/18/21 1019     Visit Number 10    Number of Visits 24    Date for PT Re-Evaluation 02/07/22    Authorization Type BCBS    PT Start Time 0930    PT Stop Time 0109    PT Time Calculation (min) 45 min    Activity Tolerance Patient tolerated treatment well    Behavior During Therapy [facility] for tasks assessed/performed             Past Medical History:  Diagnosis Date   Arthritis    Coronary artery disease    GERD (gastroesophageal reflux disease)    Hepatitis    Hep C --had to get shots   Hypertension 1/10   Impaired fasting glucose 2009   Obesity    Sleep apnea    Varicose vein     Past Surgical History:  Procedure Laterality Date   CARDIAC CATHETERIZATION     2012   CORONARY ARTERY BYPASS GRAFT  07/25/11   3 vessel   pins in right hand  1990's   4th and 5th digits-- removed 6 weeks afterwards   TOTAL KNEE ARTHROPLASTY Right 09/06/2013   Dr Mayer Camel   TOTAL KNEE ARTHROPLASTY Right 09/06/2013   Procedure: TOTAL KNEE ARTHROPLASTY;  Surgeon: Kerin Salen, MD;  Location: [place];  Service: Orthopedics;  Laterality: Right;   TOTAL KNEE ARTHROPLASTY Left 11/01/2013   DR Mayer Camel   TOTAL KNEE ARTHROPLASTY Left 11/01/2013   Procedure: TOTAL KNEE ARTHROPLASTY- LEFT;  Surgeon: Kerin Salen, MD;  Location: [place];  Service: Orthopedics;  Laterality: Left;    There were no vitals filed for this visit.   Subjective Assessment - 12/18/21 0930     Subjective Pt with c/o neck pain especially with turning and sidebending. He states it started after having to sleep with sling due to shoulder surgery    Patient  Stated Goals Improve shoulder motion    Currently in Pain? Yes    Pain Score 3     Pain Location Neck    Pain Orientation Right    Pain Descriptors / Indicators Tightness                [facility] PT Assessment - 12/18/21 0001       Assessment   Medical Diagnosis Z98.890 (ICD-10-CM) - S/P left rotator cuff repair, cervical pain    Referring Provider (PT) chandler, justin    Onset Date/Surgical Date 11/27/21      Precautions   Precautions None      Restrictions   Weight Bearing Restrictions No      AROM   AROM Assessment Site Cervical    Cervical Flexion 35 - pain    Cervical Extension 35    Cervical - Right Side Bend 20 - pain    Cervical - Left Side Bend 20 pain    Cervical - Right Rotation 33 pain    Cervical - Left Rotation 48 pain      Palpation   Spinal mobility hypomobile PAs and lateral cervical glides    Palpation comment increased mm spasticity bilat cervical paraspinals, suboccipitals, scalenes, upper traps  [facility] Adult PT Treatment/Exercise - 12/18/21 0001       Neck Exercises: Stretches   Upper Trapezius Stretch Right;Left;2 reps;20 seconds    Other Neck Stretches snags for cervical rotation    Other Neck Stretches cervical retraction x 10      Shoulder Exercises: Supine   Diagonals Left;20 reps   attempted in standing but unable     Shoulder Exercises: Seated   Flexion Strengthening;Left;20 reps    Other Seated Exercises bilat ER with red TB x 10      Shoulder Exercises: Prone   Other Prone Exercises prone row x 15 to neutral    Other Prone Exercises extension to neutral x 15      Shoulder Exercises: Sidelying   External Rotation Left;20 reps    ABduction Left;20 reps      Manual Therapy   Passive ROM STM cervical paraspinals, suboccipital release                     PT Education - 12/18/21 1003     Education Details updated HEP, needling    Person(s) Educated Patient    Methods  Explanation;Demonstration;Handout    Comprehension Returned demonstration;Verbalized understanding                 PT Long Term Goals - 12/18/21 1022       Additional Long Term Goals   Additional Long Term Goals Yes      PT LONG TERM GOAL #6   Title Pt will improve cervical sidebending and rotation ROM by 10 degrees bilat to improve ability to drive without pain    Time 8    Period Weeks    Status New    Target Date 02/07/22                   Plan - 12/18/21 1019     Clinical Impression Statement Neck evaluation performed today with pt with decreased ROM and increased mm spasticity. Pt will benefit from skilled PT to address deficits and improve functional mobility. Goals added    PT Next Visit Plan shoulder per protocol, assess neck HEP for stretching. WANTS NEEDLING    PT Home Exercise Plan [email or web address]    Consulted and Agree with Plan of Care Patient             Patient will benefit from skilled therapeutic intervention in order to improve the following deficits and impairments:     Visit Diagnosis: Muscle weakness (generalized) - Plan: PT plan of care cert/re-cert  Stiffness of left shoulder, not elsewhere classified - Plan: PT plan of care cert/re-cert  Acute pain of left shoulder - Plan: PT plan of care cert/re-cert  Neck pain - Plan: PT plan of care cert/re-cert     Problem List Patient Active Problem List   Diagnosis Date Noted   Left rotator cuff tear 05/08/2021   Wartenberg syndrome, 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   S/P right rotator cuff repair 02/04/2019   Primary osteoarthritis of both first carpometacarpal joints 06/03/2017   BPH (benign prostatic hyperplasia) 04/26/2016   Arthritis of left knee 11/01/2013   Arthritis of knee, right 09/08/2013   Obstructive sleep apnea 07/14/2013   Left Achilles tendonitis 04/23/2013   Low back pain 04/23/2013   Osteoarthritis of both knees 03/19/2013   Coronary atherosclerosis 09/06/2011   Postsurgical  aortocoronary bypass status 09/06/2011   Status post coronary artery bypass grafting 08/27/2011   Hyperlipidemia with target low density lipoprotein (LDL) cholesterol  less than 70 mg/dL 01/10/2011   OBESITY, UNSPECIFIED 01/09/2011   WEIGHT GAIN 01/09/2011   BACK STRAIN 10/02/2010   ADJUSTMENT DISORDER WITH DEPRESSED MOOD 03/31/2009   HYPERTENSION, BENIGN ESSENTIAL 11/21/2008   VARICOSE VEINS LOWER EXTREMITIES W/OTH COMPS 08/31/2008   IMPAIRED FASTING GLUCOSE 08/31/2008   COLONIC POLYPS 04/20/2008   GERD 03/16/2008    Shakari Qazi, PT 12/18/2021, 10:26 AM  [facility] [place], [name], [address] Phone: [phone]   Fax:  [phone]  Name: TASHI BAND MRN: [id number] Date of Birth: Nov 24, 1960

## 2021-12-20 ENCOUNTER — Other Ambulatory Visit: Payer: Self-pay

## 2021-12-20 ENCOUNTER — Encounter: Payer: Self-pay | Admitting: Physical Therapy

## 2021-12-20 ENCOUNTER — Ambulatory Visit: Payer: BC Managed Care – PPO | Attending: Orthopedic Surgery | Admitting: Physical Therapy

## 2021-12-20 DIAGNOSIS — M25512 Pain in left shoulder: Secondary | ICD-10-CM | POA: Insufficient documentation

## 2021-12-20 DIAGNOSIS — M542 Cervicalgia: Secondary | ICD-10-CM | POA: Insufficient documentation

## 2021-12-20 DIAGNOSIS — M25612 Stiffness of left shoulder, not elsewhere classified: Secondary | ICD-10-CM | POA: Diagnosis not present

## 2021-12-20 DIAGNOSIS — R600 Localized edema: Secondary | ICD-10-CM | POA: Insufficient documentation

## 2021-12-20 DIAGNOSIS — M6281 Muscle weakness (generalized): Secondary | ICD-10-CM | POA: Insufficient documentation

## 2021-12-20 NOTE — Therapy (Signed)
[place], [name], [address] Phone: [phone]   Fax:  [phone]  Physical Therapy Treatment  Patient Details  Name: Bobby White MRN: [id number] Date of Birth: 1960-11-23 Referring Provider (PT): chandler, justin   Encounter Date: 12/20/2021   PT End of Session - 12/20/21 0931     Visit Number 11    Number of Visits 24    Date for PT Re-Evaluation 02/07/22    Authorization Type BCBS    PT Start Time 0935    PT Stop Time 1025    PT Time Calculation (min) 50 min    Activity Tolerance Patient tolerated treatment well    Behavior During Therapy [facility] for tasks assessed/performed             Past Medical History:  Diagnosis Date   Arthritis    Coronary artery disease    GERD (gastroesophageal reflux disease)    Hepatitis    Hep C --had to get shots   Hypertension 1/10   Impaired fasting glucose 2009   Obesity    Sleep apnea    Varicose vein     Past Surgical History:  Procedure Laterality Date   CARDIAC CATHETERIZATION     2012   CORONARY ARTERY BYPASS GRAFT  07/25/11   3 vessel   pins in right hand  1990's   4th and 5th digits-- removed 6 weeks afterwards   TOTAL KNEE ARTHROPLASTY Right 09/06/2013   Dr Mayer Camel   TOTAL KNEE ARTHROPLASTY Right 09/06/2013   Procedure: TOTAL KNEE ARTHROPLASTY;  Surgeon: Kerin Salen, MD;  Location: [place];  Service: Orthopedics;  Laterality: Right;   TOTAL KNEE ARTHROPLASTY Left 11/01/2013   DR Mayer Camel   TOTAL KNEE ARTHROPLASTY Left 11/01/2013   Procedure: TOTAL KNEE ARTHROPLASTY- LEFT;  Surgeon: Kerin Salen, MD;  Location: [place];  Service: Orthopedics;  Laterality: Left;    There were no vitals filed for this visit.   Subjective Assessment - 12/20/21 0933     Subjective "I'm a little worn out".   Otherwise no new changes.  His wife has limited what he does around the house.    Currently in Pain? Yes    Pain Score 4     Pain Location Neck     Pain Orientation Right    Pain Descriptors / Indicators Tightness;Dull    Aggravating Factors  turn head quickly to Rt    Pain Relieving Factors sleeping with ring pillow for neck. aspirin.                [facility] PT Assessment - 12/20/21 0001       Assessment   Medical Diagnosis Z98.890 (ICD-10-CM) - S/P left rotator cuff repair, cervical pain    Referring Provider (PT) chandler, justin    Onset Date/Surgical Date 11/27/21             [facility] Adult PT Treatment/Exercise - 12/20/21 0001       Shoulder Exercises: Prone   Other Prone Exercises prone row x 15 to neutral      Shoulder Exercises: Sidelying   External Rotation AROM;Left;10 reps   3 sec hold in retraction   Flexion Left;10 reps   3 sec hold at end range   Flexion Limitations tactile cues for scapular positioning    ABduction AROM;Left;15 reps    ABduction Limitations tactile cues for scapular positioning    Other Sidelying Exercises Open book Lt rotation x 10  for pec / neck stretch    Other Sidelying Exercises 90 flexion to 90 abdct with LUE AROM x 10 with tactile cues to maintain scap retraction.      Shoulder Exercises: Pulleys   Flexion 2 minutes    Flexion Limitations 10 sec holds at end range    ABduction 2 minutes    ABduction Limitations 10 sec holds at end range      Shoulder Exercises: ROM/Strengthening   Rhythmic Stabilization, Supine 45, 90, 115 x 15 sec perterbations, x 2 reps each position      Shoulder Exercises: Stretch   Other Shoulder Stretches bilat bicep stretch with arms at low position on door frame x 15 sec x 3    Other Shoulder Stretches unilateral L doorway stretch x 20 sec x 2 reps (mid level and high) (turning feet vs stepping into the door way)      Modalities   Modalities Electrical Stimulation;Moist Heat;Vasopneumatic      Moist Heat Therapy   Number Minutes Moist Heat 10 Minutes    Moist Heat Location Cervical      Electrical Stimulation   Electrical Stimulation  Location bilat cervical paraspinals and UT.    Electrical Stimulation Action TENS    Electrical Stimulation Parameters 10 min, intensity to tolerance    Electrical Stimulation Goals Pain      Vasopneumatic   Number Minutes Vasopneumatic  10 minutes    Vasopnuematic Location  Shoulder   Lt   Vasopneumatic Pressure Low    Vasopneumatic Temperature  34                          PT Long Term Goals - 12/20/21 1050       PT LONG TERM GOAL #1   Title Pt will be independent with HEP    Time 12    Period Weeks    Status On-going    Target Date 02/07/22      PT LONG TERM GOAL #2   Title Pt will improve FOTO to >=68    Baseline 42    Time 12    Period Weeks    Status On-going    Target Date 02/07/22      PT LONG TERM GOAL #3   Title Pt will have L=R shoulder ROM    Time 12    Period Weeks    Status On-going    Target Date 02/07/22      PT LONG TERM GOAL #4   Title Pt will be able to lift and carry at least 10 lbs overhead to demo improved functional shoulder strength    Time 12    Period Weeks    Status On-going    Target Date 02/07/22      PT LONG TERM GOAL #6   Title Pt will improve cervical sidebending and rotation ROM by 10 degrees bilat to improve ability to drive without pain    Time 8    Period Weeks    Status On-going                   Plan - 12/20/21 1017     Clinical Impression Statement Pt tolerated AROM with longer holds in end range.  Minor cues given for scapular positioning.  Neck pain reduced with stretches and MHP/estim at end of session.  Pt progressing well within protocol. Progresses to next phase on 12/27/21.    Rehab Potential Good  PT Frequency 2x / week    PT Duration 12 weeks    PT Treatment/Interventions ADLs/Self Care Home Management;Aquatic Therapy;Cryotherapy;Electrical Stimulation;Iontophoresis 4mg /ml Dexamethasone;Moist Heat;Functional mobility training;Therapeutic activities;Therapeutic exercise;Balance  training;Neuromuscular re-education;Manual techniques;Patient/family education;Dry needling;Passive range of motion;Scar mobilization;Taping;Vasopneumatic Device    PT Next Visit Plan shoulder per protocol, neck stretches, Manual therapy/ DN as indiciated.    PT Home Exercise Plan [email or web address]    Consulted and Agree with Plan of Care Patient             Patient will benefit from skilled therapeutic intervention in order to improve the following deficits and impairments:  Decreased range of motion, Increased fascial restricitons, Impaired UE functional use, Pain, Hypomobility, Improper body mechanics, Decreased mobility, Decreased strength, Increased edema, Postural dysfunction  Visit Diagnosis: Muscle weakness (generalized)  Stiffness of left shoulder, not elsewhere classified  Acute pain of left shoulder     Problem List Patient Active Problem List   Diagnosis Date Noted   Left rotator cuff tear 05/08/2021   Wartenberg syndrome, 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   S/P right rotator cuff repair 02/04/2019   Primary osteoarthritis of both first carpometacarpal joints 06/03/2017   BPH (benign prostatic hyperplasia) 04/26/2016   Arthritis of left knee 11/01/2013   Arthritis of knee, right 09/08/2013   Obstructive sleep apnea 07/14/2013   Left Achilles tendonitis 04/23/2013   Low back pain 04/23/2013   Osteoarthritis of both knees 03/19/2013   Coronary atherosclerosis 09/06/2011   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   Status post coronary artery bypass grafting 08/27/2011   Hyperlipidemia with target low density lipoprotein (LDL) cholesterol less than 70 mg/dL 01/10/2011   OBESITY, UNSPECIFIED 01/09/2011   WEIGHT GAIN 01/09/2011   BACK STRAIN 10/02/2010   ADJUSTMENT DISORDER WITH DEPRESSED MOOD 03/31/2009   HYPERTENSION, BENIGN ESSENTIAL 11/21/2008   VARICOSE VEINS LOWER EXTREMITIES W/OTH COMPS 08/31/2008   IMPAIRED FASTING GLUCOSE 08/31/2008   COLONIC POLYPS 04/20/2008   GERD  03/16/2008   Kerin Perna, PTA 12/20/21 10:51 AM  [place], [name], [address] Phone: [phone]   Fax:  [phone]  Name: Bobby White MRN: [id number] Date of Birth: Apr 27, 1961

## 2021-12-24 ENCOUNTER — Other Ambulatory Visit: Payer: Self-pay

## 2021-12-24 ENCOUNTER — Ambulatory Visit: Payer: BC Managed Care – PPO | Admitting: Physical Therapy

## 2021-12-24 DIAGNOSIS — M6281 Muscle weakness (generalized): Secondary | ICD-10-CM | POA: Diagnosis not present

## 2021-12-24 DIAGNOSIS — M25612 Stiffness of left shoulder, not elsewhere classified: Secondary | ICD-10-CM | POA: Diagnosis not present

## 2021-12-24 DIAGNOSIS — M542 Cervicalgia: Secondary | ICD-10-CM | POA: Diagnosis not present

## 2021-12-24 DIAGNOSIS — M25512 Pain in left shoulder: Secondary | ICD-10-CM | POA: Diagnosis not present

## 2021-12-24 DIAGNOSIS — R600 Localized edema: Secondary | ICD-10-CM | POA: Diagnosis not present

## 2021-12-24 NOTE — Therapy (Signed)
[place], [name], [address] Phone: [phone]   Fax:  [phone]  Physical Therapy Treatment  Patient Details  Name: Bobby White MRN: [id number] Date of Birth: 1960-12-25 Referring Provider (PT): chandler, justin   Encounter Date: 12/24/2021   PT End of Session - 12/24/21 0848     Visit Number 12    Number of Visits 24    Date for PT Re-Evaluation 02/07/22    Authorization Type BCBS    PT Start Time 0848    PT Stop Time 0930    PT Time Calculation (min) 42 min    Activity Tolerance Patient tolerated treatment well    Behavior During Therapy [facility] for tasks assessed/performed             Past Medical History:  Diagnosis Date   Arthritis    Coronary artery disease    GERD (gastroesophageal reflux disease)    Hepatitis    Hep C --had to get shots   Hypertension 1/10   Impaired fasting glucose 2009   Obesity    Sleep apnea    Varicose vein     Past Surgical History:  Procedure Laterality Date   CARDIAC CATHETERIZATION     2012   CORONARY ARTERY BYPASS GRAFT  07/25/11   3 vessel   pins in right hand  1990's   4th and 5th digits-- removed 6 weeks afterwards   TOTAL KNEE ARTHROPLASTY Right 09/06/2013   Dr Mayer Camel   TOTAL KNEE ARTHROPLASTY Right 09/06/2013   Procedure: TOTAL KNEE ARTHROPLASTY;  Surgeon: Kerin Salen, MD;  Location: [place];  Service: Orthopedics;  Laterality: Right;   TOTAL KNEE ARTHROPLASTY Left 11/01/2013   DR Mayer Camel   TOTAL KNEE ARTHROPLASTY Left 11/01/2013   Procedure: TOTAL KNEE ARTHROPLASTY- LEFT;  Surgeon: Kerin Salen, MD;  Location: [place];  Service: Orthopedics;  Laterality: Left;    There were no vitals filed for this visit.       [facility] PT Assessment - 12/24/21 0001       Precautions   Precautions Other (comment)   Week 10-12 (2/9 to 2/23): Continue stretching, PROM, dynamic stabilization, initiate strengthening program, ER/IR with tband, Prone  rowing, prone horizontal abduction, prone extension, elbow flex/ext   Precaution Comments No lifting >5lbs, sudden lifting or pushing activities, sudden jerking motions, overhead lifting. Avoid UE bike.      AROM   Left Shoulder Flexion 135 Degrees   seated   Left Shoulder ABduction 120 Degrees   seated   Cervical Flexion 35    Cervical Extension 35    Cervical - Right Side Bend 30    Cervical - Left Side Bend 30    Cervical - Right Rotation 60   hurts at end range   Cervical - Left Rotation 60   hurt at end range                          [facility] Adult PT Treatment/Exercise - 12/24/21 0001       Neck Exercises: Stretches   Upper Trapezius Stretch Right;Left;2 reps;30 seconds    Levator Stretch Right;Left;30 seconds      Shoulder Exercises: Seated   Flexion Strengthening;Left;20 reps    Abduction AROM;Left;20 reps    Other Seated Exercises bilat ER with red TB 2x 10      Shoulder Exercises: ROM/Strengthening   Wall Wash Flexion 2x10; abduction 2x10  Other ROM/Strengthening Exercises Cervical ROM lateral flexion, flexion, rotation, ext x 5 each      Shoulder Exercises: Stretch   Other Shoulder Stretches doorway stretch x 30 sec x 2 reps (mid level and high) (turning feet vs stepping into the door way)      Vasopneumatic   Number Minutes Vasopneumatic  10 minutes    Vasopnuematic Location  Shoulder    Vasopneumatic Pressure Low    Vasopneumatic Temperature  34      Manual Therapy   Manual therapy comments Skilled assessment and palpation for TPDN and STM    Soft tissue mobilization Bilat UTs, levators, paraspinals              Trigger Point Dry Needling - 12/24/21 0001     Consent Given? Yes    Education Handout Provided Yes    Muscles Treated Head and Neck Upper trapezius;Levator scapulae;Cervical multifidi    Upper Trapezius Response Twitch reponse elicited;Palpable increased muscle length    Levator Scapulae Response Palpable increased muscle  length    Cervical multifidi Response Palpable increased muscle length                        PT Long Term Goals - 12/20/21 1050       PT LONG TERM GOAL #1   Title Pt will be independent with HEP    Time 12    Period Weeks    Status On-going    Target Date 02/07/22      PT LONG TERM GOAL #2   Title Pt will improve FOTO to >=68    Baseline 42    Time 12    Period Weeks    Status On-going    Target Date 02/07/22      PT LONG TERM GOAL #3   Title Pt will have L=R shoulder ROM    Time 12    Period Weeks    Status On-going    Target Date 02/07/22      PT LONG TERM GOAL #4   Title Pt will be able to lift and carry at least 10 lbs overhead to demo improved functional shoulder strength    Time 12    Period Weeks    Status On-going    Target Date 02/07/22      PT LONG TERM GOAL #6   Title Pt will improve cervical sidebending and rotation ROM by 10 degrees bilat to improve ability to drive without pain    Time 8    Period Weeks    Status On-going                   Plan - 12/24/21 0931     Clinical Impression Statement Addressed neck/shoulder pain with TPDN, stretching and manual work this session. Pt with good response. Continued to work on AROM -- pt with increasing motion. Phase 3 to begin 12/27/21.    Personal Factors and Comorbidities Age;Time since onset of injury/illness/exacerbation    Examination-Activity Limitations Carry;Caring for Others;Lift;Toileting;Reach Overhead    Examination-Participation Restrictions Cleaning;Community Activity;Occupation;Yard Work    Stability/Clinical Decision Making Stable/Uncomplicated    Rehab Potential Good    PT Frequency 2x / week    PT Duration 12 weeks    PT Treatment/Interventions ADLs/Self Care Home Management;Aquatic Therapy;Cryotherapy;Electrical Stimulation;Iontophoresis 4mg /ml Dexamethasone;Moist Heat;Functional mobility training;Therapeutic activities;Therapeutic exercise;Balance  training;Neuromuscular re-education;Manual techniques;Patient/family education;Dry needling;Passive range of motion;Scar mobilization;Taping;Vasopneumatic Device    PT Next Visit Plan shoulder per protocol,  neck stretches, Manual therapy/ DN as indiciated.    PT Home Exercise Plan [email or web address]    Consulted and Agree with Plan of Care Patient             Patient will benefit from skilled therapeutic intervention in order to improve the following deficits and impairments:  Decreased range of motion, Increased fascial restricitons, Impaired UE functional use, Pain, Hypomobility, Improper body mechanics, Decreased mobility, Decreased strength, Increased edema, Postural dysfunction  Visit Diagnosis: Muscle weakness (generalized)  Stiffness of left shoulder, not elsewhere classified  Acute pain of left shoulder  Neck pain  Localized edema     Problem List Patient Active Problem List   Diagnosis Date Noted   Left rotator cuff tear 05/08/2021   Wartenberg syndrome, 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   S/P right rotator cuff repair 02/04/2019   Primary osteoarthritis of both first carpometacarpal joints 06/03/2017   BPH (benign prostatic hyperplasia) 04/26/2016   Arthritis of left knee 11/01/2013   Arthritis of knee, right 09/08/2013   Obstructive sleep apnea 07/14/2013   Left Achilles tendonitis 04/23/2013   Low back pain 04/23/2013   Osteoarthritis of both knees 03/19/2013   Coronary atherosclerosis 09/06/2011   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   Status post coronary artery bypass grafting 08/27/2011   Hyperlipidemia with target low density lipoprotein (LDL) cholesterol less than 70 mg/dL 01/10/2011   OBESITY, UNSPECIFIED 01/09/2011   WEIGHT GAIN 01/09/2011   BACK STRAIN 10/02/2010   ADJUSTMENT DISORDER WITH DEPRESSED MOOD 03/31/2009   HYPERTENSION, BENIGN ESSENTIAL 11/21/2008   VARICOSE VEINS LOWER EXTREMITIES W/OTH COMPS 08/31/2008   IMPAIRED FASTING GLUCOSE 08/31/2008    COLONIC POLYPS 04/20/2008   GERD 03/16/2008    Alicia Seib April Gordy Levan, PT, DPT 12/24/2021, 9:37 AM  [facility] [place], [name], [address] Phone: [phone]   Fax:  [phone]  Name: Bobby White MRN: [id number] Date of Birth: 1961-11-07

## 2021-12-25 ENCOUNTER — Encounter: Payer: BC Managed Care – PPO | Admitting: Physical Therapy

## 2021-12-27 ENCOUNTER — Encounter: Payer: BC Managed Care – PPO | Admitting: Physical Therapy

## 2021-12-28 ENCOUNTER — Ambulatory Visit: Payer: BC Managed Care – PPO | Admitting: Physical Therapy

## 2021-12-28 ENCOUNTER — Other Ambulatory Visit: Payer: Self-pay

## 2021-12-28 DIAGNOSIS — M25612 Stiffness of left shoulder, not elsewhere classified: Secondary | ICD-10-CM

## 2021-12-28 DIAGNOSIS — M25512 Pain in left shoulder: Secondary | ICD-10-CM

## 2021-12-28 DIAGNOSIS — M6281 Muscle weakness (generalized): Secondary | ICD-10-CM

## 2021-12-28 DIAGNOSIS — R600 Localized edema: Secondary | ICD-10-CM

## 2021-12-28 DIAGNOSIS — M542 Cervicalgia: Secondary | ICD-10-CM

## 2021-12-28 NOTE — Therapy (Signed)
[place], [name], [address] Phone: [phone]   Fax:  [phone]  Physical Therapy Treatment  Patient Details  Name: Bobby White MRN: [id number] Date of Birth: [birth year] Referring Provider (PT): chandler, justin   Encounter Date: 12/28/2021   PT End of Session - 12/28/21 1021     Visit Number 13    Number of Visits 24    Date for PT Re-Evaluation 02/07/22    Authorization Type BCBS    PT Start Time 0935    PT Stop Time 1017    PT Time Calculation (min) 42 min    Activity Tolerance Patient tolerated treatment well    Behavior During Therapy [facility] for tasks assessed/performed             Past Medical History:  Diagnosis Date   Arthritis    Coronary artery disease    GERD (gastroesophageal reflux disease)    Hepatitis    Hep C --had to get shots   Hypertension 1/10   Impaired fasting glucose 2009   Obesity    Sleep apnea    Varicose vein     Past Surgical History:  Procedure Laterality Date   CARDIAC CATHETERIZATION     2012   CORONARY ARTERY BYPASS GRAFT  07/25/11   3 vessel   pins in right hand  1990's   4th and 5th digits-- removed 6 weeks afterwards   TOTAL KNEE ARTHROPLASTY Right 09/06/2013   Dr Mayer Camel   TOTAL KNEE ARTHROPLASTY Right 09/06/2013   Procedure: TOTAL KNEE ARTHROPLASTY;  Surgeon: Kerin Salen, MD;  Location: [place];  Service: Orthopedics;  Laterality: Right;   TOTAL KNEE ARTHROPLASTY Left 11/01/2013   DR Mayer Camel   TOTAL KNEE ARTHROPLASTY Left 11/01/2013   Procedure: TOTAL KNEE ARTHROPLASTY- LEFT;  Surgeon: Kerin Salen, MD;  Location: [place];  Service: Orthopedics;  Laterality: Left;    There were no vitals filed for this visit.   Subjective Assessment - 12/28/21 0944     Subjective Pt reports increased neck soreness from driving to/from [place] this week. Lt shoulder is feeling pretty good.    Currently in Pain? Yes    Pain Score 3     Pain Location Neck     Pain Orientation Right;Left    Pain Descriptors / Indicators Tightness;Sore                [facility] PT Assessment - 12/28/21 0001       Assessment   Medical Diagnosis Z98.890 (ICD-10-CM) - S/P left rotator cuff repair, cervical pain    Referring Provider (PT) chandler, justin    Onset Date/Surgical Date 11/27/21    Hand Dominance Right    Next MD Visit 01/07/22    Prior Therapy For shoulder              [facility] Adult PT Treatment/Exercise - 12/28/21 0001       Neck Exercises: Stretches   Upper Trapezius Stretch Right;Left;2 reps;30 seconds    Levator Stretch Right;Left;30 seconds      Shoulder Exercises: Prone   Retraction Left;5 reps    Flexion Left;10 reps, 2 sets   Extension Strengthening;Left;10 reps , 2 sets   Extension Weight (lbs) 2    Horizontal ABduction 1 Strengthening;Left;10 reps , 2 sets   Other Prone Exercises prone row with 3# x 10 , 2 sets     Shoulder Exercises: Sidelying   External Rotation Strengthening;Left;15 reps  External Rotation Weight (lbs) 1      Shoulder Exercises: Pulleys   Flexion 2 minutes    Flexion Limitations 10 sec holds at end range    ABduction 2 minutes    ABduction Limitations 10 sec holds at end range      Shoulder Exercises: Stretch   Other Shoulder Stretches doorway stretch x 30 sec x 2 reps (mid level and high) (turning feet vs stepping into the door way)      Moist Heat Therapy   Number Minutes Moist Heat 10 Minutes    Moist Heat Location Cervical      Electrical Stimulation   Electrical Stimulation Location bilat cervical paraspinals and UT.    Electrical Stimulation Action TENS    Electrical Stimulation Parameters 10 min, intensity to toleranc e    Electrical Stimulation Goals Pain      Vasopneumatic   Number Minutes Vasopneumatic  10 minutes    Vasopnuematic Location  Shoulder    Vasopneumatic Pressure Low    Vasopneumatic Temperature  34                 PT Long Term Goals - 12/20/21 1050        PT LONG TERM GOAL #1   Title Pt will be independent with HEP    Time 12    Period Weeks    Status On-going    Target Date 02/07/22      PT LONG TERM GOAL #2   Title Pt will improve FOTO to >=68    Baseline 42    Time 12    Period Weeks    Status On-going    Target Date 02/07/22      PT LONG TERM GOAL #3   Title Pt will have L=R shoulder ROM    Time 12    Period Weeks    Status On-going    Target Date 02/07/22      PT LONG TERM GOAL #4   Title Pt will be able to lift and carry at least 10 lbs overhead to demo improved functional shoulder strength    Time 12    Period Weeks    Status On-going    Target Date 02/07/22      PT LONG TERM GOAL #6   Title Pt will improve cervical sidebending and rotation ROM by 10 degrees bilat to improve ability to drive without pain    Time 8    Period Weeks    Status On-going                   Plan - 12/28/21 1246     Clinical Impression Statement Pt began phase 3 of rehab protocol.  Reviewed current restrictions with pt.  He tolerated prone exercises well.  He reported reduction of neck pain with exercises and MHP/estim; no increase in shoulder pain with exercises.   Making good gains towards LTGs.    Personal Factors and Comorbidities Age;Time since onset of injury/illness/exacerbation    Examination-Activity Limitations Carry;Caring for Others;Lift;Toileting;Reach Overhead    Examination-Participation Restrictions Cleaning;Community Activity;Occupation;Yard Work    Stability/Clinical Decision Making Stable/Uncomplicated    Rehab Potential Good    PT Frequency 2x / week    PT Duration 12 weeks    PT Treatment/Interventions ADLs/Self Care Home Management;Aquatic Therapy;Cryotherapy;Electrical Stimulation;Iontophoresis 4mg /ml Dexamethasone;Moist Heat;Functional mobility training;Therapeutic activities;Therapeutic exercise;Balance training;Neuromuscular re-education;Manual techniques;Patient/family education;Dry  needling;Passive range of motion;Scar mobilization;Taping;Vasopneumatic Device    PT Next Visit Plan shoulder per protocol, neck stretches,  Manual therapy/ DN as indiciated.    PT Home Exercise Plan [email or web address]    Consulted and Agree with Plan of Care Patient             Patient will benefit from skilled therapeutic intervention in order to improve the following deficits and impairments:  Decreased range of motion, Increased fascial restricitons, Impaired UE functional use, Pain, Hypomobility, Improper body mechanics, Decreased mobility, Decreased strength, Increased edema, Postural dysfunction  Visit Diagnosis: Muscle weakness (generalized)  Stiffness of left shoulder, not elsewhere classified  Acute pain of left shoulder  Neck pain  Localized edema     Problem List Patient Active Problem List   Diagnosis Date Noted   Left rotator cuff tear 05/08/2021   Wartenberg syndrome, 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   S/P right rotator cuff repair 02/04/2019   Primary osteoarthritis of both first carpometacarpal joints 06/03/2017   BPH (benign prostatic hyperplasia) 04/26/2016   Arthritis of left knee 11/01/2013   Arthritis of knee, right 09/08/2013   Obstructive sleep apnea 07/14/2013   Left Achilles tendonitis 04/23/2013   Low back pain 04/23/2013   Osteoarthritis of both knees 03/19/2013   Coronary atherosclerosis 09/06/2011   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   Status post coronary artery bypass grafting 08/27/2011   Hyperlipidemia with target low density lipoprotein (LDL) cholesterol less than 70 mg/dL 01/10/2011   OBESITY, UNSPECIFIED 01/09/2011   WEIGHT GAIN 01/09/2011   BACK STRAIN 10/02/2010   ADJUSTMENT DISORDER WITH DEPRESSED MOOD 03/31/2009   HYPERTENSION, BENIGN ESSENTIAL 11/21/2008   VARICOSE VEINS LOWER EXTREMITIES W/OTH COMPS 08/31/2008   IMPAIRED FASTING GLUCOSE 08/31/2008   COLONIC POLYPS 04/20/2008   GERD 03/16/2008   Kerin Perna,  PTA 12/28/21 12:49 PM  [place], [name], [address] Phone: [phone]   Fax:  [phone]  Name: Bobby White MRN: [id number] Date of Birth: Sep 10, 1961

## 2022-01-02 ENCOUNTER — Ambulatory Visit: Payer: BC Managed Care – PPO | Admitting: Physical Therapy

## 2022-01-02 ENCOUNTER — Other Ambulatory Visit: Payer: Self-pay

## 2022-01-02 DIAGNOSIS — R600 Localized edema: Secondary | ICD-10-CM

## 2022-01-02 DIAGNOSIS — M25612 Stiffness of left shoulder, not elsewhere classified: Secondary | ICD-10-CM

## 2022-01-02 DIAGNOSIS — M6281 Muscle weakness (generalized): Secondary | ICD-10-CM

## 2022-01-02 DIAGNOSIS — M542 Cervicalgia: Secondary | ICD-10-CM | POA: Diagnosis not present

## 2022-01-02 DIAGNOSIS — M25512 Pain in left shoulder: Secondary | ICD-10-CM

## 2022-01-02 NOTE — Therapy (Signed)
[place], [name], [address] Phone: [phone]   Fax:  [phone]  Physical Therapy Treatment  Patient Details  Name: Bobby White MRN: [id number] Date of Birth: 1961/01/04 Referring Provider (PT): chandler, justin   Encounter Date: 01/02/2022   PT End of Session - 01/02/22 0920     Visit Number 14    Number of Visits 24    Date for PT Re-Evaluation 02/07/22    PT Start Time 0845    PT Stop Time 0930    PT Time Calculation (min) 45 min    Activity Tolerance Patient tolerated treatment well    Behavior During Therapy [facility] for tasks assessed/performed             Past Medical History:  Diagnosis Date   Arthritis    Coronary artery disease    GERD (gastroesophageal reflux disease)    Hepatitis    Hep C --had to get shots   Hypertension 1/10   Impaired fasting glucose 2009   Obesity    Sleep apnea    Varicose vein     Past Surgical History:  Procedure Laterality Date   CARDIAC CATHETERIZATION     2012   CORONARY ARTERY BYPASS GRAFT  07/25/11   3 vessel   pins in right hand  1990's   4th and 5th digits-- removed 6 weeks afterwards   TOTAL KNEE ARTHROPLASTY Right 09/06/2013   Dr Mayer Camel   TOTAL KNEE ARTHROPLASTY Right 09/06/2013   Procedure: TOTAL KNEE ARTHROPLASTY;  Surgeon: Kerin Salen, MD;  Location: [place];  Service: Orthopedics;  Laterality: Right;   TOTAL KNEE ARTHROPLASTY Left 11/01/2013   DR Mayer Camel   TOTAL KNEE ARTHROPLASTY Left 11/01/2013   Procedure: TOTAL KNEE ARTHROPLASTY- LEFT;  Surgeon: Kerin Salen, MD;  Location: [place];  Service: Orthopedics;  Laterality: Left;    There were no vitals filed for this visit.   Subjective Assessment - 01/02/22 0846     Subjective Pt reports he really hopes to be able to return to work at the end of March    Patient Stated Goals Improve shoulder motion    Currently in Pain? No/denies                [facility] PT Assessment  - 01/02/22 0001       Assessment   Medical Diagnosis Z98.890 (ICD-10-CM) - S/P left rotator cuff repair, cervical pain    Referring Provider (PT) chandler, justin    Onset Date/Surgical Date 11/27/21    Hand Dominance Right    Next MD Visit 01/07/22    Prior Therapy For shoulder      AROM   Left Shoulder Flexion 138 Degrees                           [facility] Adult PT Treatment/Exercise - 01/02/22 0001       Shoulder Exercises: Supine   Protraction Left;20 reps    Protraction Weight (lbs) 5      Shoulder Exercises: Prone   Extension Strengthening;Left;20 reps    Extension Weight (lbs) 3    Horizontal ABduction 1 Strengthening;Left;20 reps    Horizontal ABduction 1 Weight (lbs) 3    Other Prone Exercises prone row 3# x 20    Other Prone Exercises Y's 2# x 20      Shoulder Exercises: Sidelying   External Rotation Strengthening;Left;20 reps    External  Rotation Weight (lbs) 3      Shoulder Exercises: Standing   Other Standing Exercises scaption 3# 2 x 10      Shoulder Exercises: Pulleys   Flexion 2 minutes    Flexion Limitations 10 sec holds at end range    ABduction 2 minutes    ABduction Limitations 10 sec holds at end range      Shoulder Exercises: ROM/Strengthening   Ball on Wall 2 x 30 sec CW/CCW    Rhythmic Stabilization, Supine 3 x 30 sec      Shoulder Exercises: Stretch   Other Shoulder Stretches doorway stretch x 30 sec x 2 reps (mid level and high) (turning feet vs stepping into the door way)      Vasopneumatic   Number Minutes Vasopneumatic  10 minutes    Vasopnuematic Location  Shoulder    Vasopneumatic Pressure Low    Vasopneumatic Temperature  34                          PT Long Term Goals - 12/20/21 1050       PT LONG TERM GOAL #1   Title Pt will be independent with HEP    Time 12    Period Weeks    Status On-going    Target Date 02/07/22      PT LONG TERM GOAL #2   Title Pt will improve FOTO to >=68     Baseline 42    Time 12    Period Weeks    Status On-going    Target Date 02/07/22      PT LONG TERM GOAL #3   Title Pt will have L=R shoulder ROM    Time 12    Period Weeks    Status On-going    Target Date 02/07/22      PT LONG TERM GOAL #4   Title Pt will be able to lift and carry at least 10 lbs overhead to demo improved functional shoulder strength    Time 12    Period Weeks    Status On-going    Target Date 02/07/22      PT LONG TERM GOAL #6   Title Pt will improve cervical sidebending and rotation ROM by 10 degrees bilat to improve ability to drive without pain    Time 8    Period Weeks    Status On-going                   Plan - 01/02/22 0920     Clinical Impression Statement Pt with good tolerance to strengthening and stability exercises. Improving ROM and strength noted    PT Next Visit Plan shoulder per protocol, neck stretches, Manual therapy/ DN as indiciated.    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   S/P right rotator cuff repair 02/04/2019   Primary osteoarthritis of both first carpometacarpal joints 06/03/2017   BPH (benign prostatic hyperplasia) 04/26/2016   Arthritis of left  knee 11/01/2013   Arthritis of knee, right 09/08/2013   Obstructive sleep apnea 07/14/2013   Left Achilles tendonitis 04/23/2013   Low back pain 04/23/2013   Osteoarthritis of both knees 03/19/2013   Coronary atherosclerosis 09/06/2011   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   Status post coronary artery bypass  grafting 08/27/2011   Hyperlipidemia with target low density lipoprotein (LDL) cholesterol less than 70 mg/dL 01/10/2011   OBESITY, UNSPECIFIED 01/09/2011   WEIGHT GAIN 01/09/2011   BACK STRAIN 10/02/2010   ADJUSTMENT DISORDER WITH DEPRESSED MOOD 03/31/2009   HYPERTENSION, BENIGN ESSENTIAL 11/21/2008   VARICOSE VEINS LOWER EXTREMITIES W/OTH COMPS 08/31/2008   IMPAIRED FASTING GLUCOSE 08/31/2008   COLONIC POLYPS 04/20/2008   GERD 03/16/2008    Hutton Pellicane, PT 01/02/2022, 9:22 AM  [facility] [place], [name], [address] Phone: [phone]   Fax:  [phone]  Name: Bobby White MRN: [id number] Date of Birth: 10/11/1961

## 2022-01-09 ENCOUNTER — Ambulatory Visit: Payer: BC Managed Care – PPO | Admitting: Physical Therapy

## 2022-01-09 ENCOUNTER — Other Ambulatory Visit: Payer: Self-pay

## 2022-01-09 DIAGNOSIS — M6281 Muscle weakness (generalized): Secondary | ICD-10-CM | POA: Diagnosis not present

## 2022-01-09 DIAGNOSIS — R600 Localized edema: Secondary | ICD-10-CM

## 2022-01-09 DIAGNOSIS — M25612 Stiffness of left shoulder, not elsewhere classified: Secondary | ICD-10-CM | POA: Diagnosis not present

## 2022-01-09 DIAGNOSIS — M542 Cervicalgia: Secondary | ICD-10-CM | POA: Diagnosis not present

## 2022-01-09 DIAGNOSIS — M25512 Pain in left shoulder: Secondary | ICD-10-CM | POA: Diagnosis not present

## 2022-01-09 NOTE — Therapy (Signed)
[place], [name], [address] Phone: [phone]   Fax:  [phone]  Physical Therapy Treatment  Patient Details  Name: Bobby White MRN: [id number] Date of Birth: 1961-06-22 Referring Provider (PT): chandler, justin   Encounter Date: 01/09/2022   PT End of Session - 01/09/22 0918     Visit Number 15    Number of Visits 24    Date for PT Re-Evaluation 02/07/22    PT Start Time 0845    PT Stop Time 0930    PT Time Calculation (min) 45 min    Activity Tolerance Patient tolerated treatment well    Behavior During Therapy [facility] for tasks assessed/performed             Past Medical History:  Diagnosis Date   Arthritis    Coronary artery disease    GERD (gastroesophageal reflux disease)    Hepatitis    Hep C --had to get shots   Hypertension 1/10   Impaired fasting glucose 2009   Obesity    Sleep apnea    Varicose vein     Past Surgical History:  Procedure Laterality Date   CARDIAC CATHETERIZATION     2012   CORONARY ARTERY BYPASS GRAFT  07/25/11   3 vessel   pins in right hand  1990's   4th and 5th digits-- removed 6 weeks afterwards   TOTAL KNEE ARTHROPLASTY Right 09/06/2013   Dr Mayer Camel   TOTAL KNEE ARTHROPLASTY Right 09/06/2013   Procedure: TOTAL KNEE ARTHROPLASTY;  Surgeon: Kerin Salen, MD;  Location: [place];  Service: Orthopedics;  Laterality: Right;   TOTAL KNEE ARTHROPLASTY Left 11/01/2013   DR Mayer Camel   TOTAL KNEE ARTHROPLASTY Left 11/01/2013   Procedure: TOTAL KNEE ARTHROPLASTY- LEFT;  Surgeon: Kerin Salen, MD;  Location: [place];  Service: Orthopedics;  Laterality: Left;    There were no vitals filed for this visit.   Subjective Assessment - 01/09/22 0844     Subjective Pt states he feels like his shoulder is doing well    Patient Stated Goals Improve shoulder motion    Currently in Pain? No/denies                [facility] PT Assessment - 01/09/22 0001        Assessment   Medical Diagnosis Z98.890 (ICD-10-CM) - S/P left rotator cuff repair, cervical pain    Referring Provider (PT) chandler, justin    Onset Date/Surgical Date 11/27/21    Hand Dominance Right    Next MD Visit 01/08/22    Prior Therapy For shoulder      AROM   Left Shoulder Flexion 145 Degrees    Left Shoulder ABduction 162 Degrees                           [facility] Adult PT Treatment/Exercise - 01/09/22 0001       Shoulder Exercises: Prone   Extension Strengthening;Left;20 reps    Extension Weight (lbs) 3    Horizontal ABduction 1 Strengthening;Left;20 reps    Horizontal ABduction 1 Weight (lbs) 3    Other Prone Exercises prone row 3# x 20    Other Prone Exercises Y's 2# x 20      Shoulder Exercises: Sidelying   External Rotation Strengthening;Left;20 reps    External Rotation Weight (lbs) 3      Shoulder Exercises: Standing   Other Standing Exercises scaption 3#  2 x 10      Shoulder Exercises: Pulleys   Flexion 2 minutes    ABduction 2 minutes      Shoulder Exercises: ROM/Strengthening   Ball on Wall 2 x 30 sec CW/CCW    Other ROM/Strengthening Exercises 90/90 carry 3# x 2 laps      Shoulder Exercises: Body Blade   Flexion 2 reps;30 seconds    ABduction 2 reps;30 seconds      Vasopneumatic   Number Minutes Vasopneumatic  10 minutes    Vasopnuematic Location  Shoulder    Vasopneumatic Pressure Low    Vasopneumatic Temperature  34                          PT Long Term Goals - 12/20/21 1050       PT LONG TERM GOAL #1   Title Pt will be independent with HEP    Time 12    Period Weeks    Status On-going    Target Date 02/07/22      PT LONG TERM GOAL #2   Title Pt will improve FOTO to >=68    Baseline 42    Time 12    Period Weeks    Status On-going    Target Date 02/07/22      PT LONG TERM GOAL #3   Title Pt will have L=R shoulder ROM    Time 12    Period Weeks    Status On-going    Target Date 02/07/22       PT LONG TERM GOAL #4   Title Pt will be able to lift and carry at least 10 lbs overhead to demo improved functional shoulder strength    Time 12    Period Weeks    Status On-going    Target Date 02/07/22      PT LONG TERM GOAL #6   Title Pt will improve cervical sidebending and rotation ROM by 10 degrees bilat to improve ability to drive without pain    Time 8    Period Weeks    Status On-going                   Plan - 01/09/22 0919     Clinical Impression Statement Pt is progressing well through protocol. He has improved ROM and strength and is progressing endurance. 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   S/P right rotator cuff repair 02/04/2019   Primary osteoarthritis of both first carpometacarpal joints 06/03/2017   BPH (benign prostatic hyperplasia) 04/26/2016   Arthritis of left knee 11/01/2013   Arthritis of knee, right 09/08/2013   Obstructive sleep apnea 07/14/2013   Left Achilles tendonitis 04/23/2013   Low back pain 04/23/2013   Osteoarthritis of both knees 03/19/2013   Coronary atherosclerosis 09/06/2011  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   Status post coronary artery bypass grafting 08/27/2011   Hyperlipidemia with target low density lipoprotein (LDL) cholesterol less than 70 mg/dL 01/10/2011   OBESITY, UNSPECIFIED 01/09/2011   WEIGHT GAIN 01/09/2011   BACK  STRAIN 10/02/2010   ADJUSTMENT DISORDER WITH DEPRESSED MOOD 03/31/2009   HYPERTENSION, BENIGN ESSENTIAL 11/21/2008   VARICOSE VEINS LOWER EXTREMITIES W/OTH COMPS 08/31/2008   IMPAIRED FASTING GLUCOSE 08/31/2008   COLONIC POLYPS 04/20/2008   GERD 03/16/2008    Elisandro Jarrett, PT 01/09/2022, 9:21 AM  [facility] [place] [address] [place], [name], [address] Phone: [phone]   Fax:  [phone]  Name: Bobby White MRN: [id number] Date of Birth: Oct 23, 1961

## 2022-01-10 ENCOUNTER — Other Ambulatory Visit: Payer: Self-pay | Admitting: Sports Medicine

## 2022-01-10 DIAGNOSIS — M180 Bilateral primary osteoarthritis of first carpometacarpal joints: Secondary | ICD-10-CM

## 2022-01-16 ENCOUNTER — Ambulatory Visit: Payer: BC Managed Care – PPO | Attending: Orthopedic Surgery | Admitting: Physical Therapy

## 2022-01-16 ENCOUNTER — Other Ambulatory Visit: Payer: Self-pay

## 2022-01-16 DIAGNOSIS — M542 Cervicalgia: Secondary | ICD-10-CM | POA: Insufficient documentation

## 2022-01-16 DIAGNOSIS — R600 Localized edema: Secondary | ICD-10-CM

## 2022-01-16 DIAGNOSIS — M6281 Muscle weakness (generalized): Secondary | ICD-10-CM | POA: Diagnosis not present

## 2022-01-16 DIAGNOSIS — M25612 Stiffness of left shoulder, not elsewhere classified: Secondary | ICD-10-CM | POA: Diagnosis not present

## 2022-01-16 DIAGNOSIS — M25512 Pain in left shoulder: Secondary | ICD-10-CM

## 2022-01-16 NOTE — Therapy (Signed)
[place] ?[facility] ?[place] ?[place], [name], [address] ?Phone: [phone]   Fax:  [phone] ? ?Physical Therapy Treatment ? ?Patient Details  ?Name: Bobby White ?MRN: [id number] ?Date of Birth: 11-Jan-1961 ?Referring Provider (PT): chandler, justin ? ? ?Encounter Date: 01/16/2022 ? ? PT End of Session - 01/16/22 3825   ? ? Visit Number 16   ? Number of Visits 24   ? Date for PT Re-Evaluation 02/07/22   ? PT Start Time 0845   ? PT Stop Time 0930   ? PT Time Calculation (min) 45 min   ? Activity Tolerance Patient tolerated treatment well   ? Behavior During Therapy [facility] for tasks assessed/performed   ? ?  ?  ? ?  ? ? ?Past Medical History:  ?Diagnosis Date  ? Arthritis   ? Coronary artery disease   ? GERD (gastroesophageal reflux disease)   ? Hepatitis   ? Hep C --had to get shots  ? Hypertension 1/10  ? Impaired fasting glucose 2009  ? Obesity   ? Sleep apnea   ? Varicose vein   ? ? ?Past Surgical History:  ?Procedure Laterality Date  ? CARDIAC CATHETERIZATION    ? 2012  ? CORONARY ARTERY BYPASS GRAFT  07/25/11  ? 3 vessel  ? pins in right hand  1990's  ? 4th and 5th digits-- removed 6 weeks afterwards  ? TOTAL KNEE ARTHROPLASTY Right 09/06/2013  ? Dr Mayer Camel  ? TOTAL KNEE ARTHROPLASTY Right 09/06/2013  ? Procedure: TOTAL KNEE ARTHROPLASTY;  Surgeon: Kerin Salen, MD;  Location: [place];  Service: Orthopedics;  Laterality: Right;  ? TOTAL KNEE ARTHROPLASTY Left 11/01/2013  ? DR Mayer Camel  ? TOTAL KNEE ARTHROPLASTY Left 11/01/2013  ? Procedure: TOTAL KNEE ARTHROPLASTY- LEFT;  Surgeon: Kerin Salen, MD;  Location: [place];  Service: Orthopedics;  Laterality: Left;  ? ? ?There were no vitals filed for this visit. ? ? Subjective Assessment - 01/16/22 0851   ? ? Subjective Pt states no changes since last visit. Feels he is doing well   ? Patient Stated Goals Improve shoulder motion   ? Currently in Pain? No/denies   ? ?  ?  ? ?  ? ? ? ? ? ? ? ? ? ? ? ? ? ? ? ? ? ? ? ?  [facility] Adult PT Treatment/Exercise - 01/16/22 0001   ? ?  ? Shoulder Exercises: Prone  ? Extension 20 reps   ? Extension Weight (lbs) 4   ? Horizontal ABduction 1 20 reps   ? Horizontal ABduction 1 Weight (lbs) 3   ? Other Prone Exercises prone row 4# x 20   ? Other Prone Exercises Y's 2# x 20   ?  ? Shoulder Exercises: Sidelying  ? External Rotation 20 reps   ? External Rotation Weight (lbs) 3   ?  ? Shoulder Exercises: Standing  ? Flexion 10 reps   ? Shoulder Flexion Weight (lbs) 3   ? Flexion Limitations isometric hold opp UE   ? ABduction 10 reps   ? Shoulder ABduction Weight (lbs) 3   ? ABduction Limitations isometric hold opp UE   ? Other Standing Exercises scaption 3# with isometric hold on opp UE   ?  ? Shoulder Exercises: Pulleys  ? Flexion 2 minutes   ? ABduction 2 minutes   ?  ? Shoulder Exercises: ROM/Strengthening  ? Ball on Wall 2 x 30 sec CW/CCW   ?  Other ROM/Strengthening Exercises 90/90 carry 3# x 2 laps   ?  ? Vasopneumatic  ? Number Minutes Vasopneumatic  10 minutes   ? Vasopnuematic Location  Shoulder   ? Vasopneumatic Pressure Low   ? Vasopneumatic Temperature  34   ? ?  ?  ? ?  ? ? ? ? ? ? ? ? ? ? ? ? ? ? ? PT Long Term Goals - 12/20/21 1050   ? ?  ? PT LONG TERM GOAL #1  ? Title Pt will be independent with HEP   ? Time 12   ? Period Weeks   ? Status On-going   ? Target Date 02/07/22   ?  ? PT LONG TERM GOAL #2  ? Title Pt will improve FOTO to >=68   ? Baseline 42   ? Time 12   ? Period Weeks   ? Status On-going   ? Target Date 02/07/22   ?  ? PT LONG TERM GOAL #3  ? Title Pt will have L=R shoulder ROM   ? Time 12   ? Period Weeks   ? Status On-going   ? Target Date 02/07/22   ?  ? PT LONG TERM GOAL #4  ? Title Pt will be able to lift and carry at least 10 lbs overhead to demo improved functional shoulder strength   ? Time 12   ? Period Weeks   ? Status On-going   ? Target Date 02/07/22   ?  ? PT LONG TERM GOAL #6  ? Title Pt will improve cervical sidebending and rotation ROM by 10 degrees bilat  to improve ability to drive without pain   ? Time 8   ? Period Weeks   ? Status On-going   ? ?  ?  ? ?  ? ? ? ? ? ? ? ? Plan - 01/16/22 0918   ? ? Clinical Impression Statement Pt continues to progress well through protocol. Challenged by addition of isometric holds this visit   ? PT Next Visit Plan shoulder endurance and strength per protocol   ? PT Home Exercise Plan [email or web address]   ? Consulted and Agree with Plan of Care Patient   ? ?  ?  ? ?  ? ? ?Patient will benefit from skilled therapeutic intervention in order to improve the following deficits and impairments:    ? ?Visit Diagnosis: ?Muscle weakness (generalized) ? ?Stiffness of left shoulder, not elsewhere classified ? ?Acute pain of left shoulder ? ?Localized edema ? ? ? ? ?Problem List ?Patient Active Problem List  ? Diagnosis Date Noted  ? Left rotator cuff tear 05/08/2021  ? 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 S/P right rotator cuff repair 02/04/2019  ? Primary osteoarthritis of both first carpometacarpal joints 06/03/2017  ? BPH (benign prostatic hyperplasia) 04/26/2016  ? Arthritis of left knee 11/01/2013  ? Arthritis of knee, right 09/08/2013  ? Obstructive sleep apnea 07/14/2013  ? Left Achilles tendonitis 04/23/2013  ? Low back pain 04/23/2013  ? Osteoarthritis of both knees 03/19/2013  ? Coronary atherosclerosis 09/06/2011  ? 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  ? Status post coronary artery bypass grafting 08/27/2011  ? Hyperlipidemia with target low density lipoprotein (LDL) cholesterol less than 70 mg/dL 01/10/2011  ? OBESITY, UNSPECIFIED 01/09/2011  ? WEIGHT GAIN 01/09/2011  ? BACK STRAIN 10/02/2010  ? ADJUSTMENT DISORDER WITH DEPRESSED MOOD 03/31/2009  ? HYPERTENSION, BENIGN ESSENTIAL 11/21/2008  ? VARICOSE VEINS LOWER EXTREMITIES W/OTH COMPS 08/31/2008  ?  IMPAIRED FASTING GLUCOSE 08/31/2008  ? COLONIC POLYPS 04/20/2008  ? GERD 03/16/2008  ? ? ?Zinnia Tindall, PT ?01/16/2022, 9:19 AM ? ?[place] ?[facility] ?[place] ?[place], [name], [address] ?Phone: [phone]   Fax:  [phone] ? ?Name: Bobby White ?MRN: [id number] ?Date of Birth: 03-19-1961 ? ? ? ?

## 2022-01-18 ENCOUNTER — Other Ambulatory Visit: Payer: Self-pay | Admitting: Sports Medicine

## 2022-01-18 DIAGNOSIS — G5630 Lesion of radial nerve, unspecified upper limb: Secondary | ICD-10-CM

## 2022-01-22 DIAGNOSIS — Z79890 Hormone replacement therapy (postmenopausal): Secondary | ICD-10-CM | POA: Diagnosis not present

## 2022-01-22 DIAGNOSIS — R5383 Other fatigue: Secondary | ICD-10-CM | POA: Diagnosis not present

## 2022-01-22 DIAGNOSIS — E291 Testicular hypofunction: Secondary | ICD-10-CM | POA: Diagnosis not present

## 2022-01-23 ENCOUNTER — Other Ambulatory Visit: Payer: Self-pay

## 2022-01-23 ENCOUNTER — Ambulatory Visit: Payer: BC Managed Care – PPO | Admitting: Physical Therapy

## 2022-01-23 DIAGNOSIS — M25512 Pain in left shoulder: Secondary | ICD-10-CM

## 2022-01-23 DIAGNOSIS — M25612 Stiffness of left shoulder, not elsewhere classified: Secondary | ICD-10-CM

## 2022-01-23 DIAGNOSIS — R600 Localized edema: Secondary | ICD-10-CM | POA: Diagnosis not present

## 2022-01-23 DIAGNOSIS — M542 Cervicalgia: Secondary | ICD-10-CM | POA: Diagnosis not present

## 2022-01-23 DIAGNOSIS — M6281 Muscle weakness (generalized): Secondary | ICD-10-CM

## 2022-01-23 NOTE — Therapy (Signed)
[place] ?[facility] ?[place] ?[place], [name], [address] ?Phone: [phone]   Fax:  [phone] ? ?Physical Therapy Treatment ? ?Patient Details  ?Name: Bobby White ?MRN: [id number] ?Date of Birth: [birth year] ?Referring Provider (PT): chandler, justin ? ? ?Encounter Date: 01/23/2022 ? ? PT End of Session - 01/23/22 0919   ? ? Visit Number 17   ? Number of Visits 24   ? Date for PT Re-Evaluation 02/07/22   ? PT Start Time 0845   ? PT Stop Time 0930   ? PT Time Calculation (min) 45 min   ? Activity Tolerance Patient tolerated treatment well   ? Behavior During Therapy [facility] for tasks assessed/performed   ? ?  ?  ? ?  ? ? ?Past Medical History:  ?Diagnosis Date  ? Arthritis   ? Coronary artery disease   ? GERD (gastroesophageal reflux disease)   ? Hepatitis   ? Hep C --had to get shots  ? Hypertension 1/10  ? Impaired fasting glucose 2009  ? Obesity   ? Sleep apnea   ? Varicose vein   ? ? ?Past Surgical History:  ?Procedure Laterality Date  ? CARDIAC CATHETERIZATION    ? 2012  ? CORONARY ARTERY BYPASS GRAFT  07/25/11  ? 3 vessel  ? pins in right hand  1990's  ? 4th and 5th digits-- removed 6 weeks afterwards  ? TOTAL KNEE ARTHROPLASTY Right 09/06/2013  ? Dr Mayer Camel  ? TOTAL KNEE ARTHROPLASTY Right 09/06/2013  ? Procedure: TOTAL KNEE ARTHROPLASTY;  Surgeon: Kerin Salen, MD;  Location: [place];  Service: Orthopedics;  Laterality: Right;  ? TOTAL KNEE ARTHROPLASTY Left 11/01/2013  ? DR Mayer Camel  ? TOTAL KNEE ARTHROPLASTY Left 11/01/2013  ? Procedure: TOTAL KNEE ARTHROPLASTY- LEFT;  Surgeon: Kerin Salen, MD;  Location: [facility];  Service: Orthopedics;  Laterality: Left;  ? ? ?There were no vitals filed for this visit. ? ? Subjective Assessment - 01/23/22 0846   ? ? Subjective Pt states no new changes. He states he is feeling pretty good   ? Patient Stated Goals Improve shoulder motion   ? Currently in Pain? No/denies   ? ?  ?  ? ?  ? ? ? ? ? [facility] PT Assessment - 01/23/22  0001   ? ?  ? Assessment  ? Medical Diagnosis Z98.890 (ICD-10-CM) - S/P left rotator cuff repair, cervical pain   ? Referring Provider (PT) chandler, justin   ? Onset Date/Surgical Date 11/27/21   ? Hand Dominance Right   ? Next MD Visit April 2023   ? Prior Therapy For shoulder   ?  ? ROM / Strength  ? AROM / PROM / Strength Strength   ?  ? Strength  ? Overall Strength Comments Lt shoulder flex 4-/5, abd 3+/5   ? ?  ?  ? ?  ? ? ? ? ? ? ? ? ? ? ? ? ? ? ? ? [facility] Adult PT Treatment/Exercise - 01/23/22 0001   ? ?  ? Shoulder Exercises: Prone  ? Extension 20 reps   ? Extension Weight (lbs) 4   ? Horizontal ABduction 1 20 reps   ? Horizontal ABduction 1 Weight (lbs) 3   ? Other Prone Exercises prone row 4# x 20   ? Other Prone Exercises Y's 2# x 20   ?  ? Shoulder Exercises: Sidelying  ? External Rotation 20 reps   ? External Rotation Weight (lbs) 3   ?  ?  Shoulder Exercises: Standing  ? Flexion 10 reps   ? Shoulder Flexion Weight (lbs) 3   ? Flexion Limitations isometric hold opp UE   ? ABduction 10 reps   ? Shoulder ABduction Weight (lbs) 3   ? ABduction Limitations isometric hold opp UE   ? Other Standing Exercises scaption 3# with isometric hold on opp UE   ?  ? Shoulder Exercises: Pulleys  ? Flexion 2 minutes   ? ABduction 2 minutes   ?  ? Shoulder Exercises: ROM/Strengthening  ? Other ROM/Strengthening Exercises tennis ball bounce in an arc x 5   ? Other ROM/Strengthening Exercises body blade 2 x 30 sec flexion   ?  ? Vasopneumatic  ? Number Minutes Vasopneumatic  10 minutes   ? Vasopnuematic Location  Shoulder   ? Vasopneumatic Pressure Low   ? Vasopneumatic Temperature  34   ? ?  ?  ? ?  ? ? ? ? ? ? ? ? ? ? ? ? ? ? ? PT Long Term Goals - 12/20/21 1050   ? ?  ? PT LONG TERM GOAL #1  ? Title Pt will be independent with HEP   ? Time 12   ? Period Weeks   ? Status On-going   ? Target Date 02/07/22   ?  ? PT LONG TERM GOAL #2  ? Title Pt will improve FOTO to >=68   ? Baseline 42   ? Time 12   ? Period Weeks   ? Status  On-going   ? Target Date 02/07/22   ?  ? PT LONG TERM GOAL #3  ? Title Pt will have L=R shoulder ROM   ? Time 12   ? Period Weeks   ? Status On-going   ? Target Date 02/07/22   ?  ? PT LONG TERM GOAL #4  ? Title Pt will be able to lift and carry at least 10 lbs overhead to demo improved functional shoulder strength   ? Time 12   ? Period Weeks   ? Status On-going   ? Target Date 02/07/22   ?  ? PT LONG TERM GOAL #6  ? Title Pt will improve cervical sidebending and rotation ROM by 10 degrees bilat to improve ability to drive without pain   ? Time 8   ? Period Weeks   ? Status On-going   ? ?  ?  ? ?  ? ? ? ? ? ? ? ? Plan - 01/23/22 0919   ? ? Clinical Impression Statement Pt continues to progress well. Required more frequent rests due to mm fatigue today but able to work through full program with no increase in pain   ? PT Next Visit Plan shoulder endurance and strength per protocol   ? PT Home Exercise Plan [email or web address]   ? Consulted and Agree with Plan of Care Patient   ? ?  ?  ? ?  ? ? ?Patient will benefit from skilled therapeutic intervention in order to improve the following deficits and impairments:    ? ?Visit Diagnosis: ?Muscle weakness (generalized) ? ?Stiffness of left shoulder, not elsewhere classified ? ?Acute pain of left shoulder ? ?Localized edema ? ? ? ? ?Problem List ?Patient Active Problem List  ? Diagnosis Date Noted  ? Left rotator cuff tear 05/08/2021  ? 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 S/P right rotator cuff repair 02/04/2019  ? Primary osteoarthritis of both first carpometacarpal joints 06/03/2017  ? BPH (benign prostatic  hyperplasia) 04/26/2016  ? Arthritis of left knee 11/01/2013  ? Arthritis of knee, right 09/08/2013  ? Obstructive sleep apnea 07/14/2013  ? Left Achilles tendonitis 04/23/2013  ? Low back pain 04/23/2013  ? Osteoarthritis of both knees 03/19/2013  ? Coronary atherosclerosis 09/06/2011  ? 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  ? Status post coronary artery  bypass grafting 08/27/2011  ? Hyperlipidemia with target low density lipoprotein (LDL) cholesterol less than 70 mg/dL 01/10/2011  ? OBESITY, UNSPECIFIED 01/09/2011  ? WEIGHT GAIN 01/09/2011  ? BACK STRAIN 10/02/2010  ? ADJUSTMENT DISORDER WITH DEPRESSED MOOD 03/31/2009  ? HYPERTENSION, BENIGN ESSENTIAL 11/21/2008  ? VARICOSE VEINS LOWER EXTREMITIES W/OTH COMPS 08/31/2008  ? IMPAIRED FASTING GLUCOSE 08/31/2008  ? COLONIC POLYPS 04/20/2008  ? GERD 03/16/2008  ? ? ?Kerry-Anne Mezo, PT ?01/23/2022, 9:22 AM ? ?[place] ?[facility] ?[place] ?[place], [name], [address] ?Phone: [phone]   Fax:  [phone] ? ?Name: JESUSMANUEL ERBES ?MRN: [id number] ?Date of Birth: 05/04/1961 ? ? ? ?

## 2022-01-29 DIAGNOSIS — R5383 Other fatigue: Secondary | ICD-10-CM | POA: Diagnosis not present

## 2022-01-29 DIAGNOSIS — M2550 Pain in unspecified joint: Secondary | ICD-10-CM | POA: Diagnosis not present

## 2022-01-29 DIAGNOSIS — Z6837 Body mass index (BMI) 37.0-37.9, adult: Secondary | ICD-10-CM | POA: Diagnosis not present

## 2022-01-29 DIAGNOSIS — E291 Testicular hypofunction: Secondary | ICD-10-CM | POA: Diagnosis not present

## 2022-01-30 ENCOUNTER — Ambulatory Visit: Payer: BC Managed Care – PPO | Admitting: Physical Therapy

## 2022-01-30 ENCOUNTER — Other Ambulatory Visit: Payer: Self-pay

## 2022-01-30 DIAGNOSIS — M6281 Muscle weakness (generalized): Secondary | ICD-10-CM | POA: Diagnosis not present

## 2022-01-30 DIAGNOSIS — M25512 Pain in left shoulder: Secondary | ICD-10-CM | POA: Diagnosis not present

## 2022-01-30 DIAGNOSIS — R600 Localized edema: Secondary | ICD-10-CM

## 2022-01-30 DIAGNOSIS — M542 Cervicalgia: Secondary | ICD-10-CM | POA: Diagnosis not present

## 2022-01-30 DIAGNOSIS — M25612 Stiffness of left shoulder, not elsewhere classified: Secondary | ICD-10-CM | POA: Diagnosis not present

## 2022-01-30 NOTE — Therapy (Signed)
[place] ?[facility] ?[place] ?[place], [name], [address] ?Phone: [phone]   Fax:  [phone] ? ?Physical Therapy Treatment ? ?Patient Details  ?Name: Bobby White ?MRN: [id number] ?Date of Birth: May 05, 1961 ?Referring Provider (PT): chandler, justin ? ? ?Encounter Date: 01/30/2022 ? ? PT End of Session - 01/30/22 0917   ? ? Visit Number 18   ? Number of Visits 24   ? Date for PT Re-Evaluation 02/07/22   ? Authorization Type BCBS   ? PT Start Time 0845   ? PT Stop Time 6283   ? PT Time Calculation (min) 42 min   ? Activity Tolerance Patient tolerated treatment well   ? Behavior During Therapy [facility] for tasks assessed/performed   ? ?  ?  ? ?  ? ? ?Past Medical History:  ?Diagnosis Date  ? Arthritis   ? Coronary artery disease   ? GERD (gastroesophageal reflux disease)   ? Hepatitis   ? Hep C --had to get shots  ? Hypertension 1/10  ? Impaired fasting glucose 2009  ? Obesity   ? Sleep apnea   ? Varicose vein   ? ? ?Past Surgical History:  ?Procedure Laterality Date  ? CARDIAC CATHETERIZATION    ? 2012  ? CORONARY ARTERY BYPASS GRAFT  07/25/11  ? 3 vessel  ? pins in right hand  1990's  ? 4th and 5th digits-- removed 6 weeks afterwards  ? TOTAL KNEE ARTHROPLASTY Right 09/06/2013  ? Dr Mayer Camel  ? TOTAL KNEE ARTHROPLASTY Right 09/06/2013  ? Procedure: TOTAL KNEE ARTHROPLASTY;  Surgeon: Kerin Salen, MD;  Location: [facility];  Service: Orthopedics;  Laterality: Right;  ? TOTAL KNEE ARTHROPLASTY Left 11/01/2013  ? DR Mayer Camel  ? TOTAL KNEE ARTHROPLASTY Left 11/01/2013  ? Procedure: TOTAL KNEE ARTHROPLASTY- LEFT;  Surgeon: Kerin Salen, MD;  Location: [facility];  Service: Orthopedics;  Laterality: Left;  ? ? ?There were no vitals filed for this visit. ? ? Subjective Assessment - 01/30/22 0849   ? ? Subjective Pt states no changes, shoulder is feeling good   ? Patient Stated Goals Improve shoulder motion   ? Currently in Pain? No/denies   ? ?  ?  ? ?  ? ? ? ? ? [facility] PT Assessment  - 01/30/22 0001   ? ?  ? Assessment  ? Medical Diagnosis Z98.890 (ICD-10-CM) - S/P left rotator cuff repair, cervical pain   ? Referring Provider (PT) chandler, justin   ? Onset Date/Surgical Date 11/27/21   ? Hand Dominance Right   ? Next MD Visit April 2023   ? Prior Therapy For shoulder   ?  ? Strength  ? Overall Strength Comments Lt shoulder flex 4-/5, abd 3+/5   ? ?  ?  ? ?  ? ? ? ? ? ? ? ? ? ? ? ? ? ? ? ? [facility] Adult PT Treatment/Exercise - 01/30/22 0001   ? ?  ? Shoulder Exercises: Prone  ? Extension 20 reps   ? Extension Weight (lbs) 5   ? Horizontal ABduction 1 20 reps   ? Horizontal ABduction 1 Weight (lbs) 4   ? Other Prone Exercises prone row 5# x 20   ? Other Prone Exercises Y's 3# x 20   ?  ? Shoulder Exercises: Standing  ? Flexion 15 reps   ? Shoulder Flexion Weight (lbs) 3   ? Flexion Limitations isometric hold opp UE   ? ABduction 15 reps   ?  Shoulder ABduction Weight (lbs) 3   ? ABduction Limitations isometric hold opp UE   ? Other Standing Exercises scaption 3# with isometric hold on opp UE   ?  ? Shoulder Exercises: Pulleys  ? Flexion 2 minutes   ? ABduction 2 minutes   ?  ? Shoulder Exercises: ROM/Strengthening  ? Wall Pushups 20 reps   ? Other ROM/Strengthening Exercises 90/90 carry 5# 2 laps   ? Other ROM/Strengthening Exercises body blade 2 x 30 sec flexion   ?  ? Vasopneumatic  ? Number Minutes Vasopneumatic  10 minutes   ? Vasopnuematic Location  Shoulder   ? Vasopneumatic Pressure Low   ? Vasopneumatic Temperature  34   ? ?  ?  ? ?  ? ? ? ? ? ? ? ? ? ? ? ? ? ? ? PT Long Term Goals - 12/20/21 1050   ? ?  ? PT LONG TERM GOAL #1  ? Title Pt will be independent with HEP   ? Time 12   ? Period Weeks   ? Status On-going   ? Target Date 02/07/22   ?  ? PT LONG TERM GOAL #2  ? Title Pt will improve FOTO to >=68   ? Baseline 42   ? Time 12   ? Period Weeks   ? Status On-going   ? Target Date 02/07/22   ?  ? PT LONG TERM GOAL #3  ? Title Pt will have L=R shoulder ROM   ? Time 12   ? Period Weeks   ?  Status On-going   ? Target Date 02/07/22   ?  ? PT LONG TERM GOAL #4  ? Title Pt will be able to lift and carry at least 10 lbs overhead to demo improved functional shoulder strength   ? Time 12   ? Period Weeks   ? Status On-going   ? Target Date 02/07/22   ?  ? PT LONG TERM GOAL #6  ? Title Pt will improve cervical sidebending and rotation ROM by 10 degrees bilat to improve ability to drive without pain   ? Time 8   ? Period Weeks   ? Status On-going   ? ?  ?  ? ?  ? ? ? ? ? ? ? ? Plan - 01/30/22 0917   ? ? Clinical Impression Statement Pt continues to progress well through protocol. Able to increase weights this visit with no increase in pain   ? PT Next Visit Plan shoulder endurance and strength per protocol, RECERT   ? PT Home Exercise Plan [email or web address]   ? Consulted and Agree with Plan of Care Patient   ? ?  ?  ? ?  ? ? ?Patient will benefit from skilled therapeutic intervention in order to improve the following deficits and impairments:    ? ?Visit Diagnosis: ?Muscle weakness (generalized) ? ?Acute pain of left shoulder ? ?Stiffness of left shoulder, not elsewhere classified ? ?Localized edema ? ? ? ? ?Problem List ?Patient Active Problem List  ? Diagnosis Date Noted  ? Left rotator cuff tear 05/08/2021  ? 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 S/P right rotator cuff repair 02/04/2019  ? Primary osteoarthritis of both first carpometacarpal joints 06/03/2017  ? BPH (benign prostatic hyperplasia) 04/26/2016  ? Arthritis of left knee 11/01/2013  ? Arthritis of knee, right 09/08/2013  ? Obstructive sleep apnea 07/14/2013  ? Left Achilles tendonitis 04/23/2013  ? Low back pain 04/23/2013  ? Osteoarthritis  of both knees 03/19/2013  ? Coronary atherosclerosis 09/06/2011  ? 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  ? Status post coronary artery bypass grafting 08/27/2011  ? Hyperlipidemia with target low density lipoprotein (LDL) cholesterol less than 70 mg/dL 01/10/2011  ? OBESITY, UNSPECIFIED 01/09/2011  ?  WEIGHT GAIN 01/09/2011  ? BACK STRAIN 10/02/2010  ? ADJUSTMENT DISORDER WITH DEPRESSED MOOD 03/31/2009  ? HYPERTENSION, BENIGN ESSENTIAL 11/21/2008  ? VARICOSE VEINS LOWER EXTREMITIES W/OTH COMPS 08/31/2008  ? IMPAIRED FASTING GLUCOSE 08/31/2008  ? COLONIC POLYPS 04/20/2008  ? GERD 03/16/2008  ? ? ?Cheston Coury, PT ?01/30/2022, 9:19 AM ? ?[place] ?[facility] ?[place] ?[place], [name], [address] ?Phone: [phone]   Fax:  [phone] ? ?Name: Bobby White ?MRN: [id number] ?Date of Birth: 12-Apr-1961 ? ? ? ?

## 2022-02-06 ENCOUNTER — Other Ambulatory Visit: Payer: Self-pay

## 2022-02-06 ENCOUNTER — Ambulatory Visit: Payer: BC Managed Care – PPO | Admitting: Physical Therapy

## 2022-02-06 ENCOUNTER — Encounter: Payer: Self-pay | Admitting: Physical Therapy

## 2022-02-06 DIAGNOSIS — M542 Cervicalgia: Secondary | ICD-10-CM | POA: Diagnosis not present

## 2022-02-06 DIAGNOSIS — M25512 Pain in left shoulder: Secondary | ICD-10-CM | POA: Diagnosis not present

## 2022-02-06 DIAGNOSIS — M6281 Muscle weakness (generalized): Secondary | ICD-10-CM

## 2022-02-06 DIAGNOSIS — M25612 Stiffness of left shoulder, not elsewhere classified: Secondary | ICD-10-CM | POA: Diagnosis not present

## 2022-02-06 DIAGNOSIS — R600 Localized edema: Secondary | ICD-10-CM | POA: Diagnosis not present

## 2022-02-06 NOTE — Therapy (Signed)
[facility] ?[place] ?[place], [name], [address] ?Phone: [phone]   Fax:  [phone] ? ?Physical Therapy Treatment and Discharge Summary ? ?Patient Details  ?Name: Bobby White ?MRN: [id number] ?Date of Birth: 01-May-1961 ?Referring Provider (PT): chandler, justin ? ? ?Encounter Date: 02/06/2022 ? ? PT End of Session - 02/06/22 0848   ? ? Visit Number 19   ? Number of Visits 24   ? Date for PT Re-Evaluation 02/07/22   ? Authorization Type BCBS   ? PT Start Time 0848   ? PT Stop Time 0930   ? PT Time Calculation (min) 42 min   ? Activity Tolerance Patient tolerated treatment well   ? Behavior During Therapy [facility] for tasks assessed/performed   ? ?  ?  ? ?  ? ? ?Past Medical History:  ?Diagnosis Date  ? Arthritis   ? Coronary artery disease   ? GERD (gastroesophageal reflux disease)   ? Hepatitis   ? Hep C --had to get shots  ? Hypertension 1/10  ? Impaired fasting glucose 2009  ? Obesity   ? Sleep apnea   ? Varicose vein   ? ? ?Past Surgical History:  ?Procedure Laterality Date  ? CARDIAC CATHETERIZATION    ? 2012  ? CORONARY ARTERY BYPASS GRAFT  07/25/11  ? 3 vessel  ? pins in right hand  1990's  ? 4th and 5th digits-- removed 6 weeks afterwards  ? TOTAL KNEE ARTHROPLASTY Right 09/06/2013  ? Dr Mayer Camel  ? TOTAL KNEE ARTHROPLASTY Right 09/06/2013  ? Procedure: TOTAL KNEE ARTHROPLASTY;  Surgeon: Kerin Salen, MD;  Location: [facility];  Service: Orthopedics;  Laterality: Right;  ? TOTAL KNEE ARTHROPLASTY Left 11/01/2013  ? DR Mayer Camel  ? TOTAL KNEE ARTHROPLASTY Left 11/01/2013  ? Procedure: TOTAL KNEE ARTHROPLASTY- LEFT;  Surgeon: Kerin Salen, MD;  Location: [facility];  Service: Orthopedics;  Laterality: Left;  ? ? ?There were no vitals filed for this visit. ? ? ? ? ? ? [facility] PT Assessment - 02/06/22 0001   ? ?  ? Assessment  ? Next MD Visit 02/20/22   ?  ? AROM  ? Left Shoulder Flexion 160 Degrees   138 in standing today  ? Left Shoulder ABduction 138 Degrees   144  in standing  ? Left Shoulder Internal Rotation 42 Degrees   ? Left Shoulder External Rotation 90 Degrees   ? Cervical - Right Side Bend 28   ? Cervical - Left Side Bend 32   ?  ? PROM  ? Left Shoulder Flexion 165 Degrees   ? Left Shoulder ABduction 165 Degrees   ? Left Shoulder Internal Rotation 57 Degrees   ?  ? Strength  ? Overall Strength Comments Lt shoulder IR/ER/Ext 5/5; ABD and flex 4-/5   ? ?  ?  ? ?  ? ? ? ? ? ? ? ? ? ? ? ? ? ? ? ? [facility] PT Treatment/Exercise - 02/06/22 0001   ? ?  ? Neck Exercises: Stretches  ? Upper Trapezius Stretch Right;Left;1 rep;30 seconds   ?  ? Shoulder Exercises: Supine  ? Other Supine Exercises shoulder press 5# x 5 (simulating work function)   ?  ? Shoulder Exercises: Standing  ? Flexion 10 reps;Left   ? Shoulder Flexion Weight (lbs) 3   ? ABduction Left;10 reps   ? Shoulder ABduction Weight (lbs) 3   ? Other Standing Exercises scaption 3# x 10   ?  Other Standing Exercises OH press with 5# x 5, 10# x 5, 10# with both arms x 10   ?  ? Shoulder Exercises: Pulleys  ? Flexion 2 minutes   ? ABduction 2 minutes   ?  ? Shoulder Exercises: Stretch  ? Internal Rotation Stretch 1 rep   30 sec  ? Internal Rotation Stretch Limitations active stretch with strap x 10   ? Other Shoulder Stretches wrist extensors stretch x 30 sec due to elbow clicking   ? ?  ?  ? ?  ? ? ? ? ? ? ? ? ? ? PT Education - 02/06/22 0937   ? ? Education Details HEP finalized   ? Person(s) Educated Patient   ? Methods Explanation;Demonstration;Handout   ? Comprehension Returned demonstration   ? ?  ?  ? ?  ? ? ? ? ? ? PT Long Term Goals - 02/06/22 0857   ? ?  ? PT LONG TERM GOAL #1  ? Title Pt will be independent with HEP   ? Status Achieved   ?  ? PT LONG TERM GOAL #2  ? Title Pt will improve FOTO to >=68   ? Baseline 49   ? Status Not Met   ?  ? PT LONG TERM GOAL #3  ? Title Pt will have L=R shoulder ROM   ? Status Partially Met   ?  ? PT LONG TERM GOAL #4  ? Title Pt will be able to lift and carry at least 10  lbs overhead to demo improved functional shoulder strength   ? Status Achieved   ?  ? PT LONG TERM GOAL #5  ? Title -   ?  ? PT LONG TERM GOAL #6  ? Title Pt will improve cervical sidebending and rotation ROM by 10 degrees bilat to improve ability to drive without pain   ? Baseline SB still limited   ? Status Partially Met   ? ?  ?  ? ?  ? ? ? ? ? ? ? ? Plan - 02/06/22 0951   ? ? Clinical Impression Statement Patient has met or partially met his LTGs. He has functional ROM and strength and will continue to progress doing his HEP. He had some clicking in his left elbow with TE today, so stretch was issued to address this. Pt is pleased with his current functional level.   ? PT Frequency 2x / week   ? PT Duration 12 weeks   ? PT Treatment/Interventions ADLs/Self Care Home Management;Aquatic Therapy;Cryotherapy;Electrical Stimulation;Iontophoresis 58m/ml Dexamethasone;Moist Heat;Functional mobility training;Therapeutic activities;Therapeutic exercise;Balance training;Neuromuscular re-education;Manual techniques;Patient/family education;Dry needling;Passive range of motion;Scar mobilization;Taping;Vasopneumatic Device   ? PT Next Visit Plan see d/c summary   ? PT HFerry Pass  ? Consulted and Agree with Plan of Care Patient   ? ?  ?  ? ?  ? ? ?Patient will benefit from skilled therapeutic intervention in order to improve the following deficits and impairments:  Decreased range of motion, Increased fascial restricitons, Impaired UE functional use, Pain, Hypomobility, Improper body mechanics, Decreased mobility, Decreased strength, Increased edema, Postural dysfunction ? ?Visit Diagnosis: ?Muscle weakness (generalized) ? ?Acute pain of left shoulder ? ?Stiffness of left shoulder, not elsewhere classified ? ?Localized edema ? ?Neck pain ? ? ? ? ?Problem List ?Patient Active Problem List  ? Diagnosis Date Noted  ? Left rotator cuff tear 05/08/2021  ? 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 S/P right rotator  cuff repair 02/04/2019  ?  Primary osteoarthritis of both first carpometacarpal joints 06/03/2017  ? BPH (benign prostatic hyperplasia) 04/26/2016  ? Arthritis of left knee 11/01/2013  ? Arthritis of knee, right 09/08/2013  ? Obstructive sleep apnea 07/14/2013  ? Left Achilles tendonitis 04/23/2013  ? Low back pain 04/23/2013  ? Osteoarthritis of both knees 03/19/2013  ? Coronary atherosclerosis 09/06/2011  ? 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  ? Status post coronary artery bypass grafting 08/27/2011  ? Hyperlipidemia with target low density lipoprotein (LDL) cholesterol less than 70 mg/dL 01/10/2011  ? OBESITY, UNSPECIFIED 01/09/2011  ? WEIGHT GAIN 01/09/2011  ? BACK STRAIN 10/02/2010  ? ADJUSTMENT DISORDER WITH DEPRESSED MOOD 03/31/2009  ? HYPERTENSION, BENIGN ESSENTIAL 11/21/2008  ? VARICOSE VEINS LOWER EXTREMITIES W/OTH COMPS 08/31/2008  ? IMPAIRED FASTING GLUCOSE 08/31/2008  ? COLONIC POLYPS 04/20/2008  ? GERD 03/16/2008  ? ? ?Madelyn Flavors, PT ?02/06/2022, 10:01 AM ? ?[facility] ?[place] ?[place], [name], [address] ?Phone: [phone]   Fax:  [phone] ? ?Name: GIANI BETZOLD ?MRN: [id number] ?Date of Birth: Jul 11, 1961 ? ?PHYSICAL THERAPY DISCHARGE SUMMARY ? ?Visits from Start of Care: 19 ? ?Current functional level related to goals / functional outcomes: ?See above ?  ?Remaining deficits: ?See above ?  ?Education / Equipment: ?HEP  ? ?Patient agrees to discharge. Patient goals were partially met. Patient is being discharged due to being pleased with the current functional level. ? ?Madelyn Flavors, PT ?02/06/22 10:01 AM ? ?[facility] ?[address] ?[place], [place] [address] ? ?[phone] (office) ?[phone] (fax) ? ?

## 2022-02-06 NOTE — Patient Instructions (Signed)
Access Code: [email or web address] ?URL: [email or web address] ?Date: 02/06/2022 ?Prepared by: Almyra Free ? ?Exercises ?Standing Shoulder Internal Rotation Stretch with Towel - 1 x daily - 7 x weekly - 1 sets - 3 reps - 30 sec hold ?Seated Single Arm Wrist Flexion Stretch - 1 x daily - 7 x weekly - 1 sets - 3 reps - 30 sec hold ?Single Arm Shoulder Flexion with Dumbbell - 1 x daily - 3 x weekly - 1-3 sets - 10 reps ?Standing Single Arm Shoulder Abduction with Dumbbell - Thumb Up - 1 x daily - 3 x weekly - 1-3 sets - 10 reps ?Scaption with Dumbbells - 1 x daily - 3 x weekly - 1-3 sets - 10 reps ? ?

## 2022-02-13 ENCOUNTER — Encounter: Payer: BC Managed Care – PPO | Admitting: Physical Therapy

## 2022-02-19 DIAGNOSIS — G4733 Obstructive sleep apnea (adult) (pediatric): Secondary | ICD-10-CM | POA: Diagnosis not present

## 2022-02-20 DIAGNOSIS — Z09 Encounter for follow-up examination after completed treatment for conditions other than malignant neoplasm: Secondary | ICD-10-CM | POA: Diagnosis not present

## 2022-02-22 ENCOUNTER — Other Ambulatory Visit: Payer: Self-pay | Admitting: Sports Medicine

## 2022-02-22 DIAGNOSIS — G5630 Lesion of radial nerve, unspecified upper limb: Secondary | ICD-10-CM

## 2022-03-29 DIAGNOSIS — G4733 Obstructive sleep apnea (adult) (pediatric): Secondary | ICD-10-CM | POA: Diagnosis not present

## 2022-04-01 ENCOUNTER — Other Ambulatory Visit: Payer: Self-pay | Admitting: Family Medicine

## 2022-05-10 DIAGNOSIS — J3089 Other allergic rhinitis: Secondary | ICD-10-CM | POA: Diagnosis not present

## 2022-05-10 DIAGNOSIS — Z6838 Body mass index (BMI) 38.0-38.9, adult: Secondary | ICD-10-CM | POA: Diagnosis not present

## 2022-05-10 DIAGNOSIS — G4733 Obstructive sleep apnea (adult) (pediatric): Secondary | ICD-10-CM | POA: Diagnosis not present

## 2022-06-07 ENCOUNTER — Other Ambulatory Visit: Payer: Self-pay | Admitting: Family Medicine

## 2022-06-07 DIAGNOSIS — I10 Essential (primary) hypertension: Secondary | ICD-10-CM

## 2022-06-12 ENCOUNTER — Encounter: Payer: Self-pay | Admitting: Family Medicine

## 2022-06-12 ENCOUNTER — Ambulatory Visit (INDEPENDENT_AMBULATORY_CARE_PROVIDER_SITE_OTHER): Payer: BC Managed Care – PPO | Admitting: Family Medicine

## 2022-06-12 VITALS — BP 104/64 | HR 71 | Ht 71.0 in | Wt 260.0 lb

## 2022-06-12 DIAGNOSIS — I10 Essential (primary) hypertension: Secondary | ICD-10-CM

## 2022-06-12 DIAGNOSIS — R7301 Impaired fasting glucose: Secondary | ICD-10-CM

## 2022-06-12 DIAGNOSIS — Z23 Encounter for immunization: Secondary | ICD-10-CM

## 2022-06-12 DIAGNOSIS — Z125 Encounter for screening for malignant neoplasm of prostate: Secondary | ICD-10-CM

## 2022-06-12 DIAGNOSIS — Z1211 Encounter for screening for malignant neoplasm of colon: Secondary | ICD-10-CM

## 2022-06-12 LAB — POCT GLYCOSYLATED HEMOGLOBIN (HGB A1C): Hemoglobin A1C: 5.3 % (ref 4.0–5.6)

## 2022-06-12 MED ORDER — ATORVASTATIN CALCIUM 40 MG PO TABS: 40.0000 mg | ORAL_TABLET | Freq: Every day | ORAL | 3 refills | Status: DC

## 2022-06-12 MED ORDER — AMLODIPINE BESYLATE 10 MG PO TABS: 10.0000 mg | ORAL_TABLET | Freq: Every day | ORAL | 1 refills | Status: DC

## 2022-06-12 NOTE — Assessment & Plan Note (Signed)
Blood pressure on the lower end today but was normal at last office visit so we will monitor.  If it stays low then we can certainly decrease his amlodipine to 5 mg.  He has been working on trying to lose some weight.

## 2022-06-12 NOTE — Assessment & Plan Note (Signed)
Well controlled. Continue current regimen. Follow up in  6 mo   Lab Results  Component Value Date   HGBA1C 5.3 06/12/2022

## 2022-06-12 NOTE — Progress Notes (Signed)
Established Patient Office Visit  Subjective   Patient ID: Bobby White, male    DOB: 11-16-1961  Age: 61 y.o. MRN: [id number]  Chief Complaint  Patient presents with   Hypertension   ifg    HPI  Hypertension- Pt denies chest pain, SOB, dizziness, or heart palpitations.  Taking meds as directed w/o problems.  Denies medication side effects.    Impaired fasting glucose-no increased thirst or urination. No symptoms consistent with hypoglycemia.     ROS    Objective:     BP 104/64   Pulse 71   Ht '5\' 11"'$  (1.803 m)   Wt 260 lb (117.9 kg)   SpO2 96%   BMI 36.26 kg/m    Physical Exam Constitutional:      Appearance: He is well-developed.  HENT:     Head: Normocephalic and atraumatic.  Cardiovascular:     Rate and Rhythm: Normal rate and regular rhythm.     Heart sounds: Normal heart sounds.  Pulmonary:     Effort: Pulmonary effort is normal.     Breath sounds: Normal breath sounds.  Skin:    General: Skin is warm and dry.  Neurological:     Mental Status: He is alert and oriented to person, place, and time.  Psychiatric:        Behavior: Behavior normal.      Results for orders placed or performed in visit on 06/12/22  POCT glycosylated hemoglobin (Hb A1C)  Result Value Ref Range   Hemoglobin A1C 5.3 4.0 - 5.6 %   HbA1c POC (<> result, manual entry)     HbA1c, POC (prediabetic range)     HbA1c, POC (controlled diabetic range)        The ASCVD Risk score (Arnett DK, et al., 2019) failed to calculate for the following reasons:   The valid total cholesterol range is 130 to 320 mg/dL    Assessment & Plan:   Problem List Items Addressed This Visit       Cardiovascular and Mediastinum   HYPERTENSION, BENIGN ESSENTIAL - Primary    Blood pressure on the lower end today but was normal at last office visit so we will monitor.  If it stays low then we can certainly decrease his amlodipine to 5 mg.  He has been working on trying to lose some weight.       Relevant Medications   amLODipine (NORVASC) 10 MG tablet   atorvastatin (LIPITOR) 40 MG tablet   Other Relevant Orders   POCT glycosylated hemoglobin (Hb A1C) (Completed)   PSA   Lipid panel   COMPLETE METABOLIC PANEL WITH GFR     Endocrine   IMPAIRED FASTING GLUCOSE    Well controlled. Continue current regimen. Follow up in  6 mo   Lab Results  Component Value Date   HGBA1C 5.3 06/12/2022         Relevant Orders   POCT glycosylated hemoglobin (Hb A1C) (Completed)   PSA   Lipid panel   COMPLETE METABOLIC PANEL WITH GFR   Other Visit Diagnoses     Screening for prostate cancer       Relevant Orders   POCT glycosylated hemoglobin (Hb A1C) (Completed)   PSA   Lipid panel   COMPLETE METABOLIC PANEL WITH GFR   Encounter for screening colonoscopy       Relevant Orders   Ambulatory referral to Gastroenterology   Need for Zostavax administration       Relevant Orders  Varicella-zoster vaccine IM (Completed)       Colon cancer screening-referral placed today.  Due for 5-year recall.   Return in about 6 months (around 12/13/2022) for Hypertension and A1C.    Beatrice Lecher, MD

## 2022-06-13 LAB — COMPLETE METABOLIC PANEL WITH GFR
AG Ratio: 2.1 (calc) (ref 1.0–2.5)
ALT: 21 U/L (ref 9–46)
AST: 21 U/L (ref 10–35)
Albumin: 4.5 g/dL (ref 3.6–5.1)
Alkaline phosphatase (APISO): 65 U/L (ref 35–144)
BUN: 18 mg/dL (ref 7–25)
CO2: 27 mmol/L (ref 20–32)
Calcium: 9.1 mg/dL (ref 8.6–10.3)
Chloride: 104 mmol/L (ref 98–110)
Creat: 0.75 mg/dL (ref 0.70–1.35)
Globulin: 2.1 g/dL (calc) (ref 1.9–3.7)
Glucose, Bld: 101 mg/dL (ref 65–139)
Potassium: 4.8 mmol/L (ref 3.5–5.3)
Sodium: 140 mmol/L (ref 135–146)
Total Bilirubin: 1.2 mg/dL (ref 0.2–1.2)
Total Protein: 6.6 g/dL (ref 6.1–8.1)
eGFR: 101 mL/min/{1.73_m2} (ref >=60)

## 2022-06-13 LAB — LIPID PANEL
Cholesterol: 97 mg/dL (ref <200)
HDL: 33 mg/dL — ABNORMAL LOW (ref >=40)
LDL Cholesterol (Calc): 45 mg/dL (calc)
Non-HDL Cholesterol (Calc): 64 mg/dL (calc) (ref <130)
Total CHOL/HDL Ratio: 2.9 (calc) (ref <5.0)
Triglycerides: 105 mg/dL (ref <150)

## 2022-06-13 LAB — PSA: PSA: 1.26 ng/mL (ref <=4.00)

## 2022-06-13 NOTE — Progress Notes (Signed)
[name], cholesterol looks fantastic!.  Prostate test is normal.  Metabolic panel looks great.  We have placed a referral for getting a screening colonoscopy so please let us know if you do not hear from somebody within the next week or 2.

## 2022-06-25 DIAGNOSIS — Z79890 Hormone replacement therapy (postmenopausal): Secondary | ICD-10-CM | POA: Diagnosis not present

## 2022-06-25 DIAGNOSIS — R5383 Other fatigue: Secondary | ICD-10-CM | POA: Diagnosis not present

## 2022-06-25 DIAGNOSIS — E291 Testicular hypofunction: Secondary | ICD-10-CM | POA: Diagnosis not present

## 2022-07-02 DIAGNOSIS — G4700 Insomnia, unspecified: Secondary | ICD-10-CM | POA: Diagnosis not present

## 2022-07-02 DIAGNOSIS — M2550 Pain in unspecified joint: Secondary | ICD-10-CM | POA: Diagnosis not present

## 2022-07-02 DIAGNOSIS — E291 Testicular hypofunction: Secondary | ICD-10-CM | POA: Diagnosis not present

## 2022-07-02 DIAGNOSIS — Z6834 Body mass index (BMI) 34.0-34.9, adult: Secondary | ICD-10-CM | POA: Diagnosis not present

## 2022-07-29 ENCOUNTER — Other Ambulatory Visit: Payer: Self-pay | Admitting: Family Medicine

## 2022-07-30 ENCOUNTER — Ambulatory Visit (INDEPENDENT_AMBULATORY_CARE_PROVIDER_SITE_OTHER): Payer: BC Managed Care – PPO | Admitting: Sports Medicine

## 2022-07-30 ENCOUNTER — Encounter: Payer: Self-pay | Admitting: Sports Medicine

## 2022-07-30 DIAGNOSIS — J320 Chronic maxillary sinusitis: Secondary | ICD-10-CM | POA: Diagnosis not present

## 2022-07-30 MED ORDER — PREDNISONE 50 MG PO TABS: 50.0000 mg | ORAL_TABLET | Freq: Every day | ORAL | 0 refills | Status: DC

## 2022-07-30 MED ORDER — AMOXICILLIN-POT CLAVULANATE 875-125 MG PO TABS: 1.0000 | ORAL_TABLET | Freq: Two times a day (BID) | ORAL | 0 refills | Status: DC

## 2022-07-30 NOTE — Assessment & Plan Note (Signed)
Pleasant 61 year old male, 3 weeks of pain right maxillary sinus, this occurred after doing some work on a classic vehicle, and doing some sanding and inhaling fine talc/silica dust. Afterwards he developed pain and pressure right maxillary sinus with radiation to the right maxillary teeth and odd smells without anosmia or dysosmia. No fevers, chills, headaches, no cough, runny nose, sore throat. On exam is turbinates are slightly erythematous, he has tenderness over the maxillary sinus. Suspect a maxillary sinusitis, adding prednisone, Augmentin, return as needed.

## 2022-07-30 NOTE — Progress Notes (Signed)
    Procedures performed today:    None.  Independent interpretation of notes and tests performed by another provider:   None.  Brief History, Exam, Impression, and Recommendations:    Right maxillary sinusitis Pleasant 61 year old male, 3 weeks of pain right maxillary sinus, this occurred after doing some work on a classic vehicle, and doing some sanding and inhaling fine talc/silica dust. Afterwards he developed pain and pressure right maxillary sinus with radiation to the right maxillary teeth and odd smells without anosmia or dysosmia. No fevers, chills, headaches, no cough, runny nose, sore throat. On exam is turbinates are slightly erythematous, he has tenderness over the maxillary sinus. Suspect a maxillary sinusitis, adding prednisone, Augmentin, return as needed.    ____________________________________________ Gwen Her. Dianah Field, M.D., ABFM., CAQSM., AME. Primary Care and Sports Medicine [facility]  Adjunct Professor of [employer]  Risk manager

## 2022-10-14 DIAGNOSIS — I10 Essential (primary) hypertension: Secondary | ICD-10-CM | POA: Diagnosis not present

## 2022-10-14 DIAGNOSIS — E7800 Pure hypercholesterolemia, unspecified: Secondary | ICD-10-CM | POA: Diagnosis not present

## 2022-10-14 DIAGNOSIS — I25810 Atherosclerosis of coronary artery bypass graft(s) without angina pectoris: Secondary | ICD-10-CM | POA: Diagnosis not present

## 2022-11-26 DIAGNOSIS — R7989 Other specified abnormal findings of blood chemistry: Secondary | ICD-10-CM | POA: Diagnosis not present

## 2022-11-26 DIAGNOSIS — Z79890 Hormone replacement therapy (postmenopausal): Secondary | ICD-10-CM | POA: Diagnosis not present

## 2022-11-26 DIAGNOSIS — E291 Testicular hypofunction: Secondary | ICD-10-CM | POA: Diagnosis not present

## 2022-11-27 ENCOUNTER — Other Ambulatory Visit: Payer: Self-pay | Admitting: Family Medicine

## 2022-11-27 DIAGNOSIS — I10 Essential (primary) hypertension: Secondary | ICD-10-CM

## 2022-12-04 DIAGNOSIS — E291 Testicular hypofunction: Secondary | ICD-10-CM | POA: Diagnosis not present

## 2022-12-04 DIAGNOSIS — Z6835 Body mass index (BMI) 35.0-35.9, adult: Secondary | ICD-10-CM | POA: Diagnosis not present

## 2022-12-04 DIAGNOSIS — M2550 Pain in unspecified joint: Secondary | ICD-10-CM | POA: Diagnosis not present

## 2022-12-04 DIAGNOSIS — G4700 Insomnia, unspecified: Secondary | ICD-10-CM | POA: Diagnosis not present

## 2022-12-16 ENCOUNTER — Encounter: Payer: Self-pay | Admitting: Family Medicine

## 2022-12-16 ENCOUNTER — Ambulatory Visit (INDEPENDENT_AMBULATORY_CARE_PROVIDER_SITE_OTHER): Payer: BC Managed Care – PPO | Admitting: Family Medicine

## 2022-12-16 VITALS — BP 136/73 | HR 80 | Ht 71.0 in | Wt 264.0 lb

## 2022-12-16 DIAGNOSIS — L649 Androgenic alopecia, unspecified: Secondary | ICD-10-CM | POA: Diagnosis not present

## 2022-12-16 DIAGNOSIS — L659 Nonscarring hair loss, unspecified: Secondary | ICD-10-CM | POA: Diagnosis not present

## 2022-12-16 DIAGNOSIS — R7301 Impaired fasting glucose: Secondary | ICD-10-CM

## 2022-12-16 DIAGNOSIS — Z23 Encounter for immunization: Secondary | ICD-10-CM | POA: Diagnosis not present

## 2022-12-16 DIAGNOSIS — I10 Essential (primary) hypertension: Secondary | ICD-10-CM | POA: Diagnosis not present

## 2022-12-16 LAB — POCT GLYCOSYLATED HEMOGLOBIN (HGB A1C): Hemoglobin A1C: 5.6 % (ref 4.0–5.6)

## 2022-12-16 MED ORDER — FINASTERIDE 5 MG PO TABS: 5.0000 mg | ORAL_TABLET | Freq: Every day | ORAL | 3 refills | Status: DC

## 2022-12-16 MED ORDER — FINASTERIDE 1 MG PO TABS: 1.0000 mg | ORAL_TABLET | Freq: Every day | ORAL | 3 refills | Status: DC

## 2022-12-16 MED ORDER — ROGAINE EXTRA STRENGTH FOR MEN 5 % EX SOLN: 1.0000 | Freq: Every day | CUTANEOUS | 2 refills | Status: DC

## 2022-12-16 NOTE — Progress Notes (Signed)
   Established Patient Office Visit  Subjective   Patient ID: Bobby White, male    DOB: 11-01-1961  Age: 62 y.o. MRN: [id number]  Chief Complaint  Patient presents with   Follow-up    Htn and gerd    HPI  Hypertension- Pt denies chest pain, SOB, dizziness, or heart palpitations.  Taking meds as directed w/o problems.  Denies medication side effects.    Impaired fasting glucose-no increased thirst or urination. No symptoms consistent with hypoglycemia.  Work has been really stressful. Another employee that has been really disrespectful.    Also wanted to discuss her loss he feels like he is losing and excessive amount of hair.  He has had some surgeries and some stressors that he thinks could be triggering it.    ROS    Objective:     BP 136/73   Pulse 80   Ht '5\' 11"'$  (1.803 m)   Wt 264 lb (119.7 kg)   SpO2 98%   BMI 36.82 kg/m    Physical Exam Constitutional:      Appearance: He is well-developed.  HENT:     Head: Normocephalic and atraumatic.  Cardiovascular:     Rate and Rhythm: Normal rate and regular rhythm.     Heart sounds: Normal heart sounds.  Pulmonary:     Effort: Pulmonary effort is normal.     Breath sounds: Normal breath sounds.  Skin:    General: Skin is warm and dry.  Neurological:     Mental Status: He is alert and oriented to person, place, and time.  Psychiatric:        Behavior: Behavior normal.     Results for orders placed or performed in visit on 12/16/22  POCT HgB A1C  Result Value Ref Range   Hemoglobin A1C 5.6 4.0 - 5.6 %   HbA1c POC (<> result, manual entry)     HbA1c, POC (prediabetic range)     HbA1c, POC (controlled diabetic range)        The ASCVD Risk score (Arnett DK, et al., 2019) failed to calculate for the following reasons:   The valid total cholesterol range is 130 to 320 mg/dL    Assessment & Plan:   Problem List Items Addressed This Visit       Cardiovascular and Mediastinum   HYPERTENSION, BENIGN  ESSENTIAL - Primary    Cardiology really wants his blood pressure under 130 consistently.  Did encourage him to monitor at home as well.  If he is getting more elevated blood pressures we can always make an adjustment to his blood pressure regimen.        Endocrine   Impaired fasting glucose    Well controlled. Continue current regimen. Follow up in  6 months.        Relevant Orders   POCT HgB A1C (Completed)     Musculoskeletal and Integument   Androgenic alopecia    Discussed tx ith topical lidocaine and oral finasteride.        Relevant Medications   finasteride (PROPECIA) 1 MG tablet   Other Visit Diagnoses     Hair loss       Relevant Medications   MINOXIDIL, TOPICAL, (ROGAINE EXTRA STRENGTH) 5 % SOLN   finasteride (PROPECIA) 1 MG tablet       Return in about 6 months (around 06/16/2023) for Hypertension.    Beatrice Lecher, MD

## 2022-12-16 NOTE — Assessment & Plan Note (Signed)
Discussed tx ith topical lidocaine and oral finasteride.

## 2022-12-16 NOTE — Assessment & Plan Note (Signed)
Cardiology really wants his blood pressure under 130 consistently.  Did encourage him to monitor at home as well.  If he is getting more elevated blood pressures we can always make an adjustment to his blood pressure regimen.

## 2022-12-16 NOTE — Assessment & Plan Note (Signed)
Well controlled. Continue current regimen. Follow up in  6 months.  

## 2023-01-03 ENCOUNTER — Other Ambulatory Visit: Payer: Self-pay | Admitting: Sports Medicine

## 2023-01-03 DIAGNOSIS — M180 Bilateral primary osteoarthritis of first carpometacarpal joints: Secondary | ICD-10-CM

## 2023-01-14 DIAGNOSIS — M25562 Pain in left knee: Secondary | ICD-10-CM | POA: Diagnosis not present

## 2023-01-14 DIAGNOSIS — M25561 Pain in right knee: Secondary | ICD-10-CM | POA: Diagnosis not present

## 2023-02-25 IMAGING — DX DG CERVICAL SPINE COMPLETE 4+V
9 series · 9 of 9 positions shown · non-contrast
Comparison: None.

CLINICAL DATA: Neck pain

EXAM:
CERVICAL SPINE - COMPLETE 4+ VIEW

[c-spine lat]
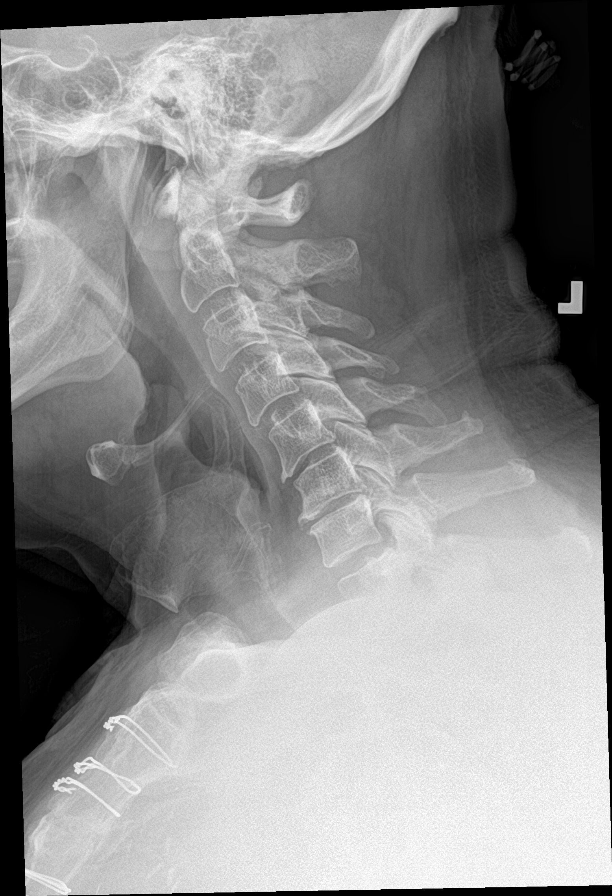

[c-spine obl (1 of 2)]
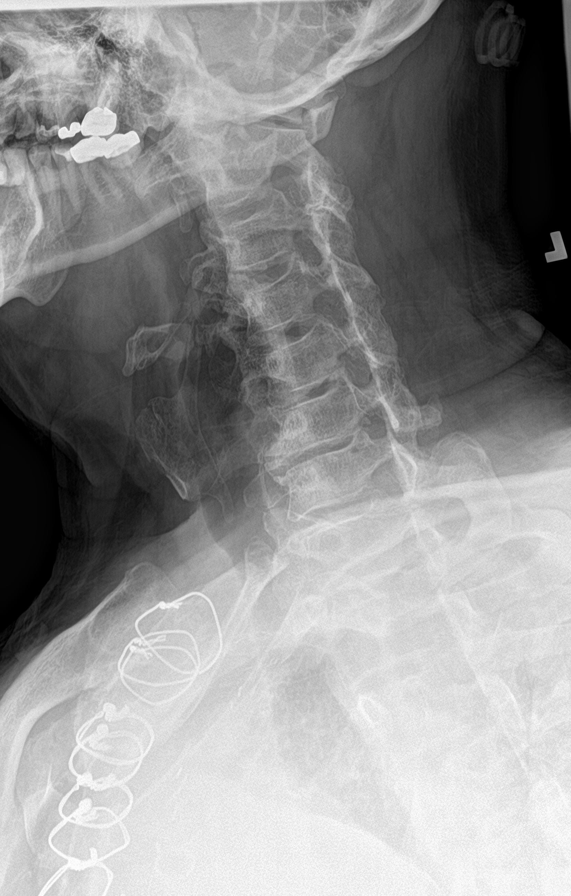

[c-spine obl (2 of 2)]
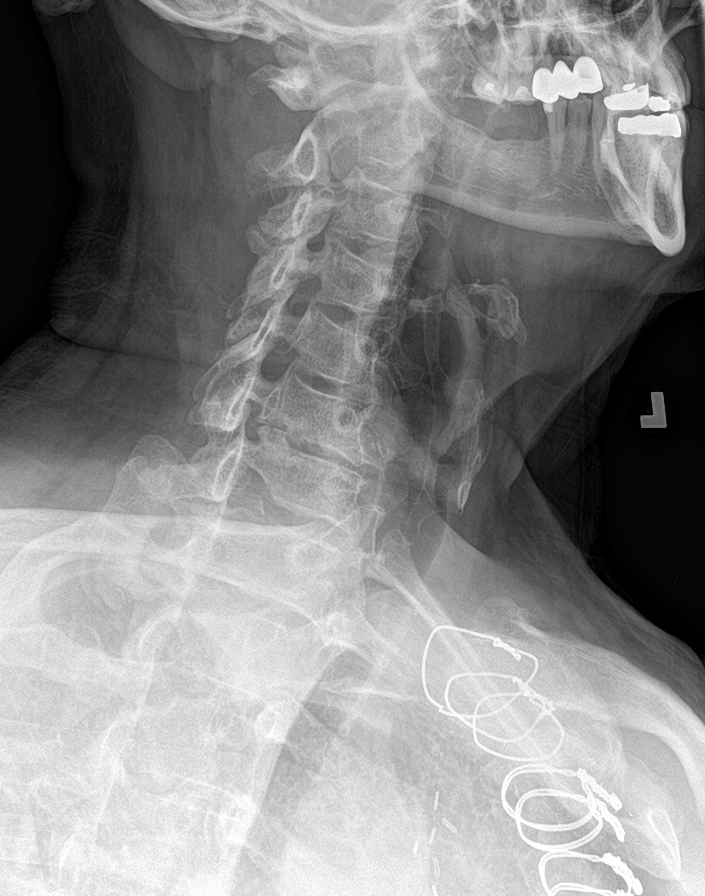

[c-spine ap (1 of 2)]
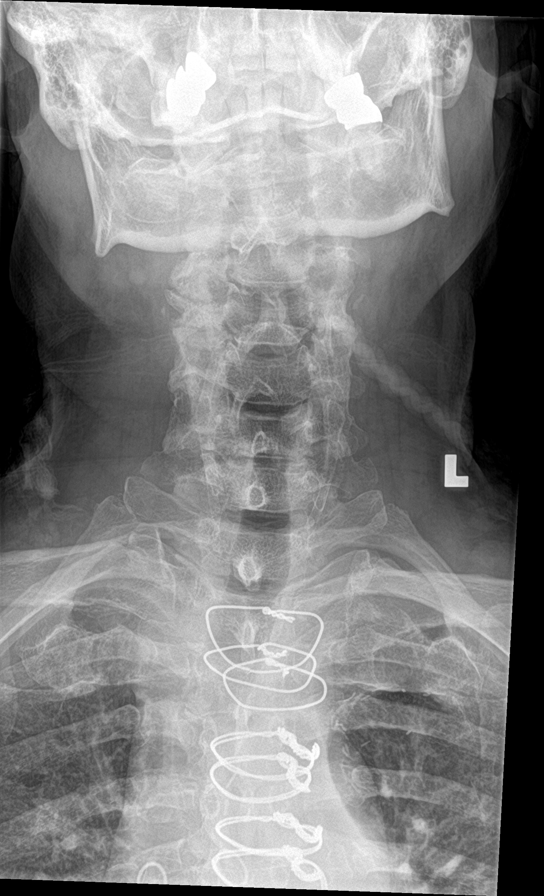

[c-spine open mouth (1 of 3)]
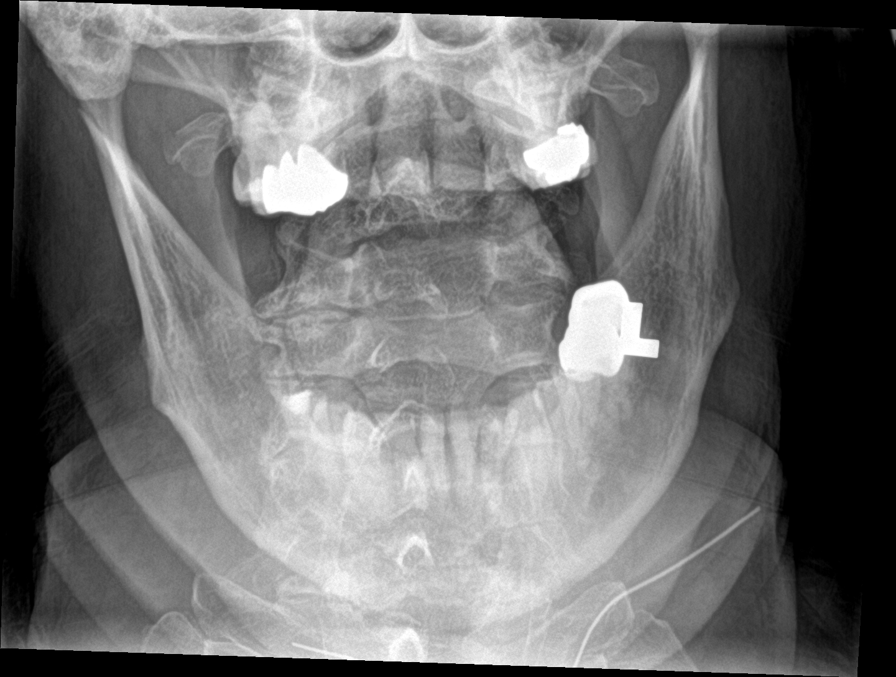

[c-spine swimmers]
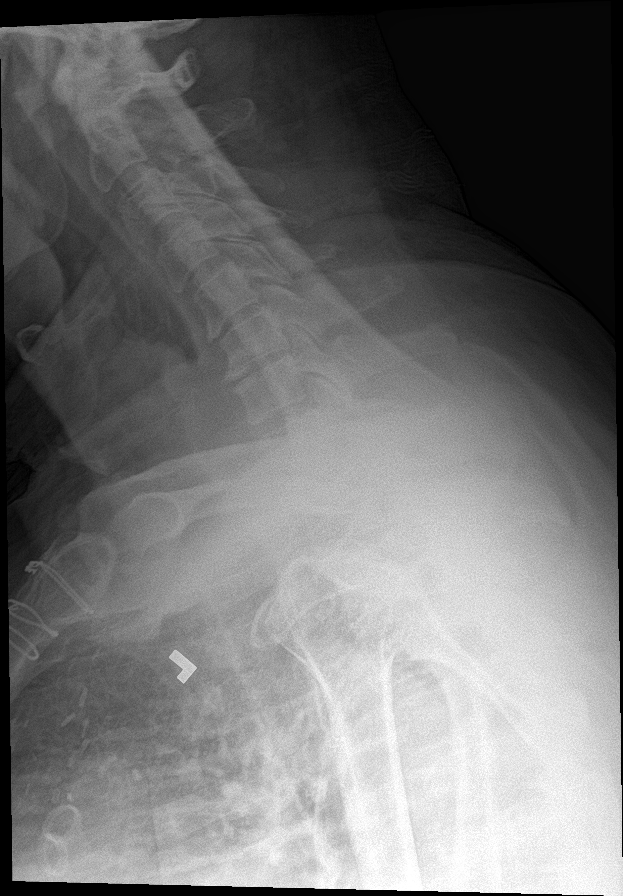

[c-spine ap (2 of 2)]
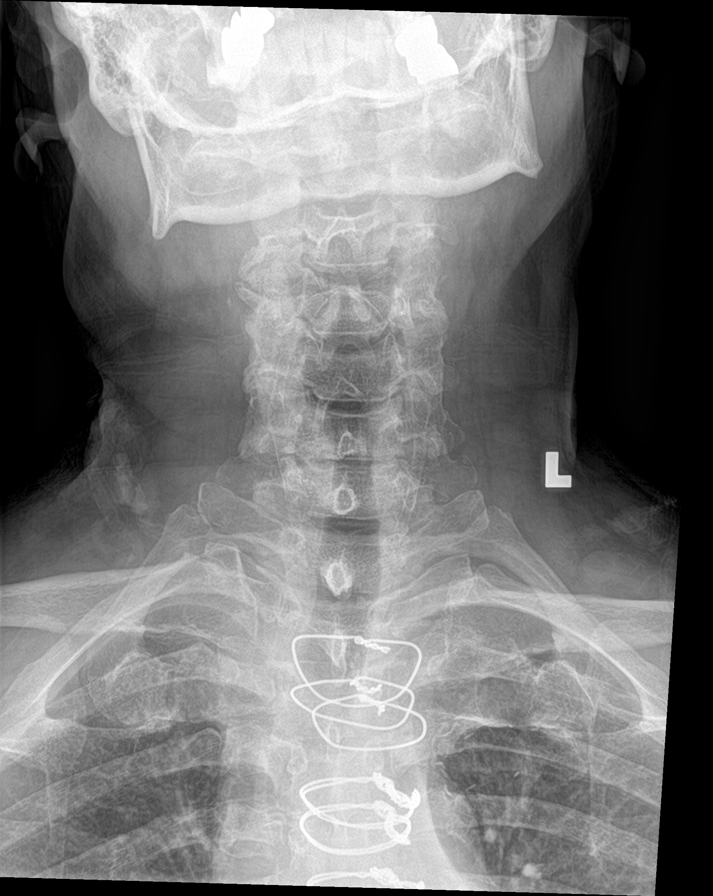

[c-spine open mouth (2 of 3)]
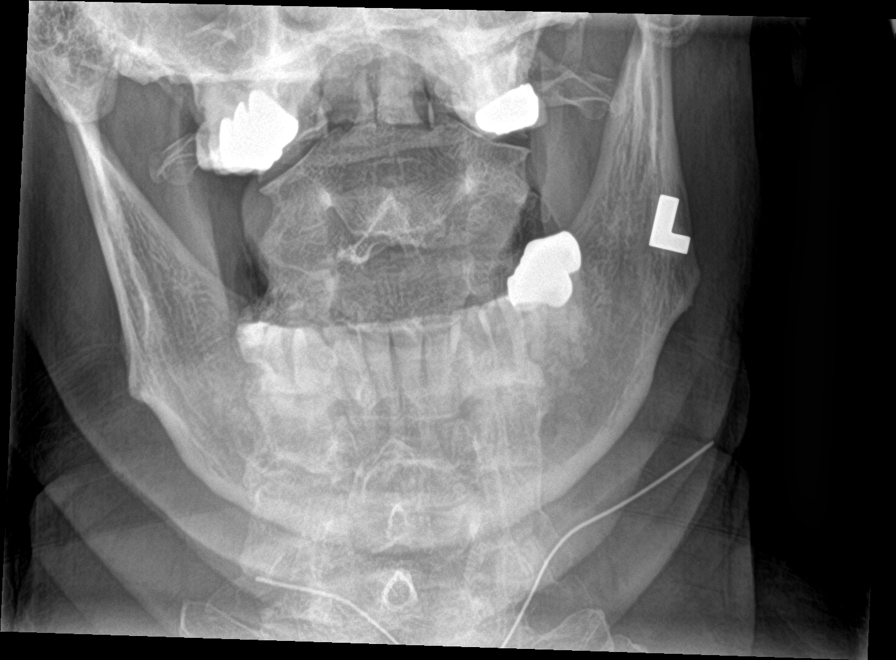

[c-spine open mouth (3 of 3)]
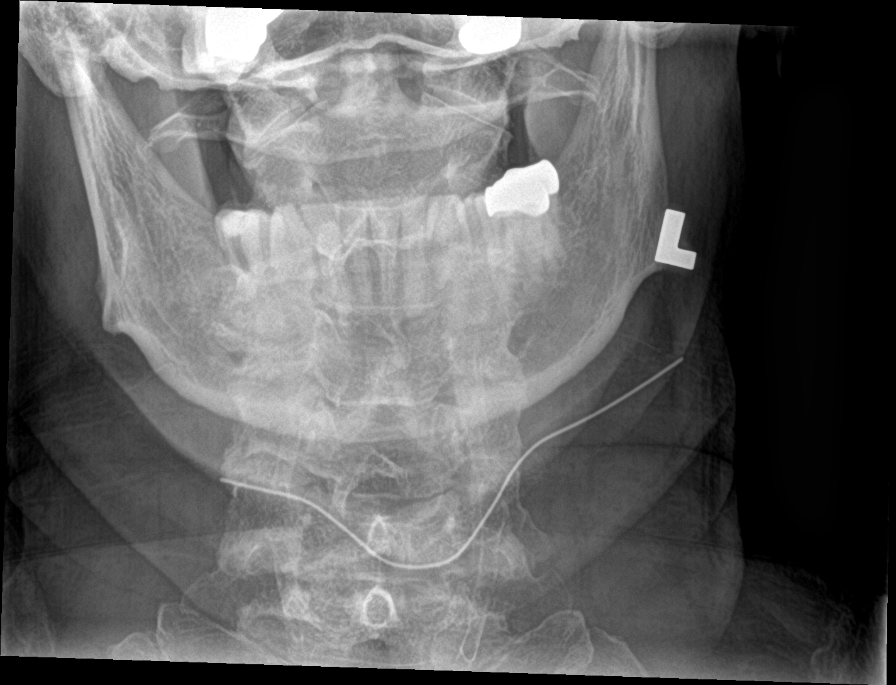

[9 of 9 positions shown; findings below may reference images not displayed]

FINDINGS: No evidence of cervical spine fracture or prevertebral soft tissue
swelling. Mild multilevel degenerative disc disease, most pronounced
at C5-C6 and C6-C7 with there is mild disc space height loss. Mild
facet arthropathy. Visualized dens is intact on open-mouth view.
Partially visualized median sternotomy wires.
IMPRESSION: No radiographic evidence of acute cervical spine injury. Mild
degenerative changes.

## 2023-04-01 DIAGNOSIS — E291 Testicular hypofunction: Secondary | ICD-10-CM | POA: Diagnosis not present

## 2023-04-01 DIAGNOSIS — Z79890 Hormone replacement therapy (postmenopausal): Secondary | ICD-10-CM | POA: Diagnosis not present

## 2023-04-09 DIAGNOSIS — R6882 Decreased libido: Secondary | ICD-10-CM | POA: Diagnosis not present

## 2023-04-09 DIAGNOSIS — Z6836 Body mass index (BMI) 36.0-36.9, adult: Secondary | ICD-10-CM | POA: Diagnosis not present

## 2023-04-09 DIAGNOSIS — E291 Testicular hypofunction: Secondary | ICD-10-CM | POA: Diagnosis not present

## 2023-04-09 DIAGNOSIS — M2550 Pain in unspecified joint: Secondary | ICD-10-CM | POA: Diagnosis not present

## 2023-05-12 DIAGNOSIS — Z86010 Personal history of colonic polyps: Secondary | ICD-10-CM | POA: Diagnosis not present

## 2023-05-12 DIAGNOSIS — K219 Gastro-esophageal reflux disease without esophagitis: Secondary | ICD-10-CM | POA: Diagnosis not present

## 2023-05-12 DIAGNOSIS — K921 Melena: Secondary | ICD-10-CM | POA: Diagnosis not present

## 2023-05-12 DIAGNOSIS — Z8719 Personal history of other diseases of the digestive system: Secondary | ICD-10-CM | POA: Diagnosis not present

## 2023-05-13 ENCOUNTER — Encounter: Payer: Self-pay | Admitting: Family Medicine

## 2023-05-13 ENCOUNTER — Ambulatory Visit (INDEPENDENT_AMBULATORY_CARE_PROVIDER_SITE_OTHER): Payer: BC Managed Care – PPO | Admitting: Family Medicine

## 2023-05-13 VITALS — BP 121/61 | HR 73 | Ht 71.0 in | Wt 264.0 lb

## 2023-05-13 DIAGNOSIS — L237 Allergic contact dermatitis due to plants, except food: Secondary | ICD-10-CM

## 2023-05-13 MED ORDER — PREDNISONE 20 MG PO TABS: ORAL_TABLET | ORAL | 0 refills | Status: AC

## 2023-05-13 MED ORDER — CLOBETASOL PROPIONATE 0.05 % EX OINT: 1.0000 | TOPICAL_OINTMENT | Freq: Two times a day (BID) | CUTANEOUS | 1 refills | Status: AC

## 2023-05-13 MED ORDER — PREDNISONE 20 MG PO TABS: 40.0000 mg | ORAL_TABLET | Freq: Every day | ORAL | 0 refills | Status: DC

## 2023-05-13 MED ORDER — METHYLPREDNISOLONE SODIUM SUCC 125 MG IJ SOLR
125.0000 mg | Freq: Once | INTRAMUSCULAR | Status: AC
  Administered 2023-05-13: 125 mg via INTRAMUSCULAR

## 2023-05-13 NOTE — Progress Notes (Signed)
   Acute Office Visit  Subjective:     Patient ID: Bobby White, male    DOB: 1961-04-13, 63 y.o.   MRN: [id number]  Chief Complaint  Patient presents with   Poison Ivy    Rash started on L foot last week and has since moved up his L leg. He stated that he has rubbed his L leg on his R leg which has spread the rash. He has been using Hydrocortisone cream,gold bond powder and standing in front of a fan to cool down    HPI Patient is in today for plant dermatitis.   Rash started on L foot last week and has since moved up his L leg. He stated that he has rubbed his L leg on his R leg which has spread the rash. He has been using Hydrocortisone cream,gold bond powder and standing in front of a fan to cool down    ROS      Objective:    BP 121/61   Pulse 73   Ht 5\' 11"  (1.803 m)   Wt 264 lb (119.7 kg)   SpO2 95%   BMI 36.82 kg/m    Physical Exam Skin:    Comments: Brightly erythematous rash over both medial lower legs and inner thighs.  He says that it started on the top of the left foot but I did not examine the foot today.  No active weeping or blisters.     No results found for any visits on 05/13/23.      Assessment & Plan:   Problem List Items Addressed This Visit   None Visit Diagnoses     Poison ivy dermatitis    -  Primary   Relevant Medications   clobetasol ointment (TEMOVATE) 0.05 %   methylPREDNISolone sodium succinate (SOLU-MEDROL) 125 mg/2 mL injection 125 mg (Completed)   predniSONE (DELTASONE) 20 MG tablet       Since he has pretty significant symptoms we will treat with oral prednisone.  Solu-Medrol IM given here in the office for more acute relief as he will not be able to pick up the prescription until this evening.  Also gave him clobetasol ointment to use.  Also discussed that if he has another exposure this summer to use soap to immediately try to clean the oil off the skin.  Packs can be really helpful for itching as well.  Meds ordered  this encounter  Medications   DISCONTD: predniSONE (DELTASONE) 20 MG tablet    Sig: Take 2 tablets (40 mg total) by mouth daily.    Dispense:  10 tablet    Refill:  0   clobetasol ointment (TEMOVATE) 0.05 %    Sig: Apply 1 Application topically 2 (two) times daily.    Dispense:  30 g    Refill:  1   methylPREDNISolone sodium succinate (SOLU-MEDROL) 125 mg/2 mL injection 125 mg   predniSONE (DELTASONE) 20 MG tablet    Sig: Take 2 tablets (40 mg total) by mouth daily for 4 days, THEN 1 tablet (20 mg total) daily for 4 days, THEN 0.5 tablets (10 mg total) daily for 4 days. Pls.    Dispense:  14 tablet    Refill:  0    Pls disregard prior rx    Return if symptoms worsen or fail to improve.  Nani Gasser, MD

## 2023-06-16 ENCOUNTER — Encounter: Payer: Self-pay | Admitting: Family Medicine

## 2023-06-16 ENCOUNTER — Ambulatory Visit: Payer: BC Managed Care – PPO | Admitting: Family Medicine

## 2023-06-16 VITALS — BP 122/77 | HR 77 | Ht 71.0 in | Wt 251.0 lb

## 2023-06-16 DIAGNOSIS — R7301 Impaired fasting glucose: Secondary | ICD-10-CM

## 2023-06-16 DIAGNOSIS — I10 Essential (primary) hypertension: Secondary | ICD-10-CM | POA: Diagnosis not present

## 2023-06-16 DIAGNOSIS — Z125 Encounter for screening for malignant neoplasm of prostate: Secondary | ICD-10-CM

## 2023-06-16 DIAGNOSIS — Z8719 Personal history of other diseases of the digestive system: Secondary | ICD-10-CM | POA: Diagnosis not present

## 2023-06-16 DIAGNOSIS — K921 Melena: Secondary | ICD-10-CM | POA: Diagnosis not present

## 2023-06-16 DIAGNOSIS — K219 Gastro-esophageal reflux disease without esophagitis: Secondary | ICD-10-CM | POA: Diagnosis not present

## 2023-06-16 DIAGNOSIS — K635 Polyp of colon: Secondary | ICD-10-CM | POA: Diagnosis not present

## 2023-06-16 DIAGNOSIS — K5730 Diverticulosis of large intestine without perforation or abscess without bleeding: Secondary | ICD-10-CM | POA: Diagnosis not present

## 2023-06-16 DIAGNOSIS — K2289 Other specified disease of esophagus: Secondary | ICD-10-CM | POA: Diagnosis not present

## 2023-06-16 DIAGNOSIS — D125 Benign neoplasm of sigmoid colon: Secondary | ICD-10-CM | POA: Diagnosis not present

## 2023-06-16 DIAGNOSIS — K648 Other hemorrhoids: Secondary | ICD-10-CM | POA: Diagnosis not present

## 2023-06-16 DIAGNOSIS — Z86010 Personal history of colonic polyps: Secondary | ICD-10-CM | POA: Diagnosis not present

## 2023-06-16 LAB — POCT GLYCOSYLATED HEMOGLOBIN (HGB A1C): Hemoglobin A1C: 5.3 % (ref 4.0–5.6)

## 2023-06-16 MED ORDER — SILDENAFIL CITRATE 20 MG PO TABS: 40.0000 mg | ORAL_TABLET | Freq: Every day | ORAL | 1 refills | Status: DC | PRN

## 2023-06-16 NOTE — Assessment & Plan Note (Signed)
A1c looks really great today at 5.3.  Continue current regimen.  Follow-up in 6 months.  Really is doing fantastic he has been trying to eat a little less and lose a little bit of weight.

## 2023-06-16 NOTE — Assessment & Plan Note (Signed)
Well controlled. Continue current regimen. Follow up in  6 mo  

## 2023-06-16 NOTE — Progress Notes (Signed)
Established Patient Office Visit  Subjective   Patient ID: Bobby White, male    DOB: 1961-08-29  Age: 62 y.o. MRN: [id number]  Chief Complaint  Patient presents with   Hypertension   ifg    HPI  Hypertension- Pt denies chest pain, SOB, dizziness, or heart palpitations.  Taking meds as directed w/o problems.  Denies medication side effects.    Impaired fasting glucose-no increased thirst or urination. No symptoms consistent with hypoglycemia.  Being cinnamon capsules.  Has been trying to lose some weight lately.    ROS    Objective:     BP 122/77   Pulse 77   Ht 5\' 11"  (1.803 m)   Wt 251 lb (113.9 kg)   SpO2 97%   BMI 35.01 kg/m    Physical Exam Constitutional:      Appearance: He is well-developed.  HENT:     Head: Normocephalic and atraumatic.     Right Ear: Tympanic membrane, ear canal and external ear normal.     Left Ear: Tympanic membrane, ear canal and external ear normal.     Nose: Nose normal.     Mouth/Throat:     Pharynx: Oropharynx is clear.  Eyes:     Conjunctiva/sclera: Conjunctivae normal.     Pupils: Pupils are equal, round, and reactive to light.  Neck:     Thyroid: No thyromegaly.  Cardiovascular:     Rate and Rhythm: Normal rate.     Heart sounds: Normal heart sounds.  Pulmonary:     Effort: Pulmonary effort is normal.     Breath sounds: Normal breath sounds.  Musculoskeletal:     Cervical back: Neck supple.  Lymphadenopathy:     Cervical: No cervical adenopathy.  Skin:    General: Skin is warm and dry.  Neurological:     Mental Status: He is alert and oriented to person, place, and time.      Results for orders placed or performed in visit on 06/16/23  POCT glycosylated hemoglobin (Hb A1C)  Result Value Ref Range   Hemoglobin A1C 5.3 4.0 - 5.6 %   HbA1c POC (<> result, manual entry)     HbA1c, POC (prediabetic range)     HbA1c, POC (controlled diabetic range)        The ASCVD Risk score (Arnett DK, et al., 2019)  failed to calculate for the following reasons:   The valid total cholesterol range is 130 to 320 mg/dL    Assessment & Plan:   Problem List Items Addressed This Visit       Cardiovascular and Mediastinum   HYPERTENSION, BENIGN ESSENTIAL - Primary    Well controlled. Continue current regimen. Follow up in  6 mo       Relevant Medications   sildenafil (REVATIO) 20 MG tablet   Other Relevant Orders   POCT glycosylated hemoglobin (Hb A1C) (Completed)   Lipid Panel With LDL/HDL Ratio   CMP14+EGFR   PSA     Endocrine   Impaired fasting glucose    A1c looks really great today at 5.3.  Continue current regimen.  Follow-up in 6 months.  Really is doing fantastic he has been trying to eat a little less and lose a little bit of weight.      Relevant Orders   POCT glycosylated hemoglobin (Hb A1C) (Completed)   Lipid Panel With LDL/HDL Ratio   CMP14+EGFR   PSA   Other Visit Diagnoses     Screening for prostate cancer  Relevant Orders   POCT glycosylated hemoglobin (Hb A1C) (Completed)   Lipid Panel With LDL/HDL Ratio   CMP14+EGFR   PSA       Return in about 6 months (around 12/17/2023).   I spent 20 minutes on the day of the encounter to include pre-visit record review, face-to-face time with the patient and post visit ordering of test.  Nani Gasser, MD

## 2023-06-17 NOTE — Progress Notes (Signed)
Your lab work is within acceptable range and there are no concerning findings.   ?

## 2023-07-02 DIAGNOSIS — F418 Other specified anxiety disorders: Secondary | ICD-10-CM | POA: Diagnosis not present

## 2023-07-30 ENCOUNTER — Encounter: Payer: Self-pay | Admitting: Family Medicine

## 2023-07-30 MED ORDER — SILDENAFIL CITRATE 20 MG PO TABS: 40.0000 mg | ORAL_TABLET | Freq: Every day | ORAL | 1 refills | Status: DC | PRN

## 2023-08-08 ENCOUNTER — Other Ambulatory Visit: Payer: Self-pay | Admitting: Family Medicine

## 2023-08-14 ENCOUNTER — Encounter: Payer: Self-pay | Admitting: Family Medicine

## 2023-08-14 ENCOUNTER — Ambulatory Visit (INDEPENDENT_AMBULATORY_CARE_PROVIDER_SITE_OTHER): Payer: BC Managed Care – PPO | Admitting: Family Medicine

## 2023-08-14 VITALS — BP 130/65 | HR 77 | Ht 71.0 in | Wt 253.0 lb

## 2023-08-14 DIAGNOSIS — S39012A Strain of muscle, fascia and tendon of lower back, initial encounter: Secondary | ICD-10-CM

## 2023-08-14 DIAGNOSIS — L259 Unspecified contact dermatitis, unspecified cause: Secondary | ICD-10-CM | POA: Diagnosis not present

## 2023-08-14 DIAGNOSIS — Z23 Encounter for immunization: Secondary | ICD-10-CM

## 2023-08-14 MED ORDER — PREDNISONE 10 MG (48) PO TBPK: ORAL_TABLET | ORAL | 0 refills | Status: DC

## 2023-08-14 NOTE — Assessment & Plan Note (Signed)
Low back strain with sciatica.  Kenalog injection given, adding steroid taper.  Given HEP.

## 2023-08-14 NOTE — Progress Notes (Signed)
Bobby White - 62 y.o. male MRN [id number]  Date of birth: 08/27/1961  Subjective Chief Complaint  Patient presents with   Rash   Leg Injury    HPI Bobby White is a 62 y.o. male here today with complaint of rash on posterior R leg.  He noticed this after working in the crawl space under his house.  Rash is on the back of the R knee.  Rash is itchy, denies pain. Hasn't tried anything on the area so far..    Also feels that he may have pulled a muscle.  He was walking around his house and caught his shoestring on a limb.  Kicked backwards to try and remove limb and felt pain radiate from lower back/upper buttock down the R leg.  Has had continued pain since this.  No numbness, tingling or weakness.   ROS:  A comprehensive ROS was completed and negative except as noted per HPI  Allergies  Allergen Reactions   Poison Ivy Extract Itching, Rash and Other (See Comments)   Ace Inhibitors Other (See Comments)    REACTION: cough   Sulfamethoxazole-Trimethoprim     Other reaction(s): hives    Past Medical History:  Diagnosis Date   Arthritis    Coronary artery disease    GERD (gastroesophageal reflux disease)    Hepatitis    Hep C --had to get shots   Hypertension 1/10   Impaired fasting glucose 2009   Obesity    Sleep apnea    Varicose vein     Past Surgical History:  Procedure Laterality Date   CARDIAC CATHETERIZATION     2012   CORONARY ARTERY BYPASS GRAFT  07/25/11   3 vessel   pins in right hand  1990's   4th and 5th digits-- removed 6 weeks afterwards   TOTAL KNEE ARTHROPLASTY Right 09/06/2013   Dr Turner Daniels   TOTAL KNEE ARTHROPLASTY Right 09/06/2013   Procedure: TOTAL KNEE ARTHROPLASTY;  Surgeon: Nestor Lewandowsky, MD;  Location: [facility] OR;  Service: Orthopedics;  Laterality: Right;   TOTAL KNEE ARTHROPLASTY Left 11/01/2013   DR Turner Daniels   TOTAL KNEE ARTHROPLASTY Left 11/01/2013   Procedure: TOTAL KNEE ARTHROPLASTY- LEFT;  Surgeon: Nestor Lewandowsky, MD;  Location: [facility] OR;   Service: Orthopedics;  Laterality: Left;    Social History   Socioeconomic History   Marital status: Married    Spouse name: Not on file   Number of children: Not on file   Years of education: Not on file   Highest education level: Not on file  Occupational History   Not on file  Tobacco Use   Smoking status: Former    Current packs/day: 0.00    Average packs/day: 1.5 packs/day for 15.0 years (22.5 ttl pk-yrs)    Types: Cigarettes    Start date: 11/18/1981    Quit date: 11/18/1996    Years since quitting: 26.7   Smokeless tobacco: Never  Vaping Use   Vaping status: Former  Substance and Sexual Activity   Alcohol use: Yes    Comment: socially   Drug use: Yes    Types: Marijuana    Comment: last time 2012 after his heart surgery   Sexual activity: Not on file  Other Topics Concern   Not on file  Social History Narrative   Not on file   Social Determinants of Health   Financial Resource Strain: Not on file  Food Insecurity: No Food Insecurity (10/02/2021)   [facility],  [facility]   Hunger Vital Sign    Worried About Running Out of Food in the Last Year: Never true    Ran Out of Food in the Last Year: Never true  Transportation Needs: Not on file  Physical Activity: Not on file  Stress: Not on file  Social Connections: Unknown (03/31/2022)   Received from [facility], [facility]   Social Network    Social Network: Not on file    Family History  Problem Relation Age of Onset   Diabetes Mother    Cancer Father        pancreatic    Health Maintenance  Topic Date Due   COVID-19 Vaccine (1 - 2023-24 season) 08/30/2023 (Originally 07/20/2023)   Zoster Vaccines- Shingrix (2 of 2) 11/13/2023 (Originally 08/07/2022)   DTaP/Tdap/Td (3 - Td or Tdap) 06/02/2028   Colonoscopy  06/15/2028   INFLUENZA VACCINE  Completed   Hepatitis C Screening  Completed   HIV Screening  Completed   Pneumococcal Vaccine 73-3 Years old  Aged Out   HPV VACCINES  Aged  Out     ----------------------------------------------------------------------------------------------------------------------------------------------------------------------------------------------------------------- Physical Exam BP 130/65 (BP Location: Left Arm, Patient Position: Sitting, Cuff Size: Large)   Pulse 77   Ht 5\' 11"  (1.803 m)   Wt 253 lb (114.8 kg)   SpO2 97%   BMI 35.29 kg/m   Physical Exam Constitutional:      Appearance: Normal appearance.  HENT:     Head: Normocephalic and atraumatic.  Cardiovascular:     Rate and Rhythm: Normal rate and regular rhythm.  Pulmonary:     Effort: Pulmonary effort is normal.     Breath sounds: Normal breath sounds.  Musculoskeletal:     Cervical back: Neck supple.     Comments: Antalgic gait.  Strength in LE is normal bilaterally.    Skin:    Comments: Raised, erythematous patches on posterior R knee/calf.  Some excoriation present.     Neurological:     General: No focal deficit present.     Mental Status: He is alert.  Psychiatric:        Mood and Affect: Mood normal.        Behavior: Behavior normal.     ------------------------------------------------------------------------------------------------------------------------------------------------------------------------------------------------------------------- Assessment and Plan  Contact dermatitis Rash with appearance of contact dermatitis.  Possibly poison ivy.  Given injection of kenalog 40mg  today.  12 day prednisone taper sent in.  No signs of infection at this time.  Discussed red/flags precautions.   Low back strain Low back strain with sciatica.  Kenalog injection given, adding steroid taper.  Given HEP.     Meds ordered this encounter  Medications   predniSONE (STERAPRED UNI-PAK 48 TAB) 10 MG (48) TBPK tablet    Sig: Taper as directed on packaging.  12 day taper    Dispense:  48 tablet    Refill:  0    No follow-ups on file.    This visit  occurred during the SARS-CoV-2 public health emergency.  Safety protocols were in place, including screening questions prior to the visit, additional usage of staff PPE, and extensive cleaning of exam room while observing appropriate contact time as indicated for disinfecting solutions.

## 2023-08-14 NOTE — Assessment & Plan Note (Signed)
Rash with appearance of contact dermatitis.  Possibly poison ivy.  Given injection of kenalog 40mg  today.  12 day prednisone taper sent in.  No signs of infection at this time.  Discussed red/flags precautions.

## 2023-08-20 ENCOUNTER — Other Ambulatory Visit: Payer: Self-pay | Admitting: Family Medicine

## 2023-09-09 DIAGNOSIS — E291 Testicular hypofunction: Secondary | ICD-10-CM | POA: Diagnosis not present

## 2023-09-09 DIAGNOSIS — Z79890 Hormone replacement therapy (postmenopausal): Secondary | ICD-10-CM | POA: Diagnosis not present

## 2023-09-17 DIAGNOSIS — E291 Testicular hypofunction: Secondary | ICD-10-CM | POA: Diagnosis not present

## 2023-09-17 DIAGNOSIS — Z6835 Body mass index (BMI) 35.0-35.9, adult: Secondary | ICD-10-CM | POA: Diagnosis not present

## 2023-09-17 DIAGNOSIS — R6882 Decreased libido: Secondary | ICD-10-CM | POA: Diagnosis not present

## 2023-09-17 DIAGNOSIS — M2550 Pain in unspecified joint: Secondary | ICD-10-CM | POA: Diagnosis not present

## 2023-10-31 ENCOUNTER — Other Ambulatory Visit: Payer: Self-pay | Admitting: Family Medicine

## 2023-10-31 DIAGNOSIS — I10 Essential (primary) hypertension: Secondary | ICD-10-CM

## 2023-11-24 DIAGNOSIS — F4320 Adjustment disorder, unspecified: Secondary | ICD-10-CM | POA: Diagnosis not present

## 2023-12-08 DIAGNOSIS — F4320 Adjustment disorder, unspecified: Secondary | ICD-10-CM | POA: Diagnosis not present

## 2023-12-15 DIAGNOSIS — F4320 Adjustment disorder, unspecified: Secondary | ICD-10-CM | POA: Diagnosis not present

## 2023-12-17 ENCOUNTER — Ambulatory Visit (INDEPENDENT_AMBULATORY_CARE_PROVIDER_SITE_OTHER): Payer: BC Managed Care – PPO | Admitting: Family Medicine

## 2023-12-17 ENCOUNTER — Encounter: Payer: Self-pay | Admitting: Family Medicine

## 2023-12-17 VITALS — BP 144/79 | HR 86 | Ht 71.0 in | Wt 251.0 lb

## 2023-12-17 DIAGNOSIS — F439 Reaction to severe stress, unspecified: Secondary | ICD-10-CM | POA: Diagnosis not present

## 2023-12-17 DIAGNOSIS — Z23 Encounter for immunization: Secondary | ICD-10-CM | POA: Diagnosis not present

## 2023-12-17 DIAGNOSIS — I2510 Atherosclerotic heart disease of native coronary artery without angina pectoris: Secondary | ICD-10-CM

## 2023-12-17 DIAGNOSIS — I10 Essential (primary) hypertension: Secondary | ICD-10-CM

## 2023-12-17 DIAGNOSIS — R7301 Impaired fasting glucose: Secondary | ICD-10-CM | POA: Diagnosis not present

## 2023-12-17 LAB — POCT GLYCOSYLATED HEMOGLOBIN (HGB A1C): Hemoglobin A1C: 5.2 % (ref 4.0–5.6)

## 2023-12-17 MED ORDER — SILDENAFIL CITRATE 20 MG PO TABS: 40.0000 mg | ORAL_TABLET | Freq: Every day | ORAL | 1 refills | Status: DC | PRN

## 2023-12-17 NOTE — Assessment & Plan Note (Signed)
Blood pressure is elevated today.  Normally it is better controlled.  He is actually down a couple pounds since I last saw him.  Repeat blood pressure was better.  He has been under a lot of stress recently.

## 2023-12-17 NOTE — Progress Notes (Signed)
Established Patient Office Visit  Subjective  Patient ID: Bobby White, male    DOB: June 29, 1961  Age: 63 y.o. MRN: [id number]  Chief Complaint  Patient presents with   Hypertension    HPI  Hypertension- Pt denies chest pain, SOB, dizziness, or heart palpitations.  Taking meds as directed w/o problems.  Denies medication side effects.       ROS    Objective:     BP (!) 144/79   Pulse 86   Ht 5\' 11"  (1.803 m)   Wt 251 lb (113.9 kg)   SpO2 94%   BMI 35.01 kg/m    Physical Exam Vitals and nursing note reviewed.  Constitutional:      Appearance: Normal appearance.  HENT:     Head: Normocephalic and atraumatic.  Eyes:     Conjunctiva/sclera: Conjunctivae normal.  Cardiovascular:     Rate and Rhythm: Normal rate and regular rhythm.  Pulmonary:     Effort: Pulmonary effort is normal.     Breath sounds: Normal breath sounds.  Skin:    General: Skin is warm and dry.  Neurological:     Mental Status: He is alert.  Psychiatric:        Mood and Affect: Mood normal.      Results for orders placed or performed in visit on 12/17/23  POCT HgB A1C  Result Value Ref Range   Hemoglobin A1C 5.2 4.0 - 5.6 %   HbA1c POC (<> result, manual entry)     HbA1c, POC (prediabetic range)     HbA1c, POC (controlled diabetic range)        The ASCVD Risk score (Arnett DK, et al., 2019) failed to calculate for the following reasons:   The valid total cholesterol range is 130 to 320 mg/dL    Assessment & Plan:   Problem List Items Addressed This Visit       Cardiovascular and Mediastinum   HYPERTENSION, BENIGN ESSENTIAL   Blood pressure is elevated today.  Normally it is better controlled.  He is actually down a couple pounds since I last saw him.  Repeat blood pressure was better.  He has been under a lot of stress recently.      Relevant Medications   sildenafil (REVATIO) 20 MG tablet   Other Relevant Orders   CMP14+EGFR   Coronary atherosclerosis,  s/p CABG  (2012)   Follows with Dr. Lorenso Courier.  Last stress test 2021.      Relevant Medications   sildenafil (REVATIO) 20 MG tablet     Endocrine   Impaired fasting glucose - Primary   1C looks great today doing a good job in regards to glucose levels.  Lab Results  Component Value Date   HGBA1C 5.2 12/17/2023         Relevant Orders   POCT HgB A1C (Completed)   CMP14+EGFR     Other   Stress at home   He meets with his therapist weekly.  Unfortunately he has been dealing with a separation with his spouse after 39 years of marriage.  Concerned about finances and being in significant debt.  He is not interested in medication at this point in time.  Just trying to stay focused while at work.  The last couple of months have been incredibly stressful.      Other Visit Diagnoses       Encounter for immunization       Relevant Orders   Varicella-zoster vaccine IM (Completed)  Return in about 6 months (around 06/15/2024) for Hypertension, Pre-diabetes.    Nani Gasser, MD

## 2023-12-17 NOTE — Assessment & Plan Note (Signed)
Follows with Dr. Lorenso Courier.  Last stress test 2021.

## 2023-12-17 NOTE — Assessment & Plan Note (Signed)
He meets with his therapist weekly.  Unfortunately he has been dealing with a separation with his spouse after 39 years of marriage.  Concerned about finances and being in significant debt.  He is not interested in medication at this point in time.  Just trying to stay focused while at work.  The last couple of months have been incredibly stressful.

## 2023-12-17 NOTE — Assessment & Plan Note (Signed)
1C looks great today doing a good job in regards to glucose levels.  Lab Results  Component Value Date   HGBA1C 5.2 12/17/2023

## 2023-12-17 NOTE — Patient Instructions (Signed)
Come back in 2 weeks for nurse visit for BP check.

## 2023-12-18 ENCOUNTER — Encounter: Payer: Self-pay | Admitting: Family Medicine

## 2023-12-18 LAB — CMP14+EGFR
ALT: 22 [IU]/L (ref 0–44)
AST: 24 [IU]/L (ref 0–40)
Albumin: 4.6 g/dL (ref 3.9–4.9)
Alkaline Phosphatase: 80 [IU]/L (ref 44–121)
BUN/Creatinine Ratio: 18 (ref 10–24)
BUN: 15 mg/dL (ref 8–27)
Bilirubin Total: 0.9 mg/dL (ref 0.0–1.2)
CO2: 24 mmol/L (ref 20–29)
Calcium: 9.4 mg/dL (ref 8.6–10.2)
Chloride: 104 mmol/L (ref 96–106)
Creatinine, Ser: 0.84 mg/dL (ref 0.76–1.27)
Globulin, Total: 1.9 g/dL (ref 1.5–4.5)
Glucose: 96 mg/dL (ref 70–99)
Potassium: 4.9 mmol/L (ref 3.5–5.2)
Sodium: 142 mmol/L (ref 134–144)
Total Protein: 6.5 g/dL (ref 6.0–8.5)
eGFR: 97 mL/min/{1.73_m2} (ref >59)

## 2023-12-18 NOTE — Progress Notes (Signed)
Your lab work is within acceptable range and there are no concerning findings.   ?

## 2023-12-22 DIAGNOSIS — R42 Dizziness and giddiness: Secondary | ICD-10-CM | POA: Diagnosis not present

## 2023-12-22 DIAGNOSIS — I25708 Atherosclerosis of coronary artery bypass graft(s), unspecified, with other forms of angina pectoris: Secondary | ICD-10-CM | POA: Diagnosis not present

## 2023-12-22 DIAGNOSIS — E7800 Pure hypercholesterolemia, unspecified: Secondary | ICD-10-CM | POA: Diagnosis not present

## 2023-12-22 DIAGNOSIS — I10 Essential (primary) hypertension: Secondary | ICD-10-CM | POA: Diagnosis not present

## 2023-12-25 DIAGNOSIS — F4320 Adjustment disorder, unspecified: Secondary | ICD-10-CM | POA: Diagnosis not present

## 2024-01-02 ENCOUNTER — Ambulatory Visit (INDEPENDENT_AMBULATORY_CARE_PROVIDER_SITE_OTHER): Payer: BC Managed Care – PPO | Admitting: Family Medicine

## 2024-01-02 VITALS — BP 127/77 | HR 70 | Ht 71.0 in | Wt 256.0 lb

## 2024-01-02 DIAGNOSIS — I10 Essential (primary) hypertension: Secondary | ICD-10-CM

## 2024-01-02 NOTE — Progress Notes (Signed)
Pt presents to clinic today for BP check. Denies any cp/sob/palpitaions/headaches/dizziness or swelling.

## 2024-01-06 DIAGNOSIS — F4320 Adjustment disorder, unspecified: Secondary | ICD-10-CM | POA: Diagnosis not present

## 2024-01-19 DIAGNOSIS — I25810 Atherosclerosis of coronary artery bypass graft(s) without angina pectoris: Secondary | ICD-10-CM | POA: Diagnosis not present

## 2024-01-19 DIAGNOSIS — E7800 Pure hypercholesterolemia, unspecified: Secondary | ICD-10-CM | POA: Diagnosis not present

## 2024-01-19 DIAGNOSIS — I10 Essential (primary) hypertension: Secondary | ICD-10-CM | POA: Diagnosis not present

## 2024-01-22 DIAGNOSIS — I25708 Atherosclerosis of coronary artery bypass graft(s), unspecified, with other forms of angina pectoris: Secondary | ICD-10-CM | POA: Diagnosis not present

## 2024-01-22 DIAGNOSIS — F4320 Adjustment disorder, unspecified: Secondary | ICD-10-CM | POA: Diagnosis not present

## 2024-01-22 DIAGNOSIS — R42 Dizziness and giddiness: Secondary | ICD-10-CM | POA: Diagnosis not present

## 2024-01-22 DIAGNOSIS — I10 Essential (primary) hypertension: Secondary | ICD-10-CM | POA: Diagnosis not present

## 2024-01-22 DIAGNOSIS — E7800 Pure hypercholesterolemia, unspecified: Secondary | ICD-10-CM | POA: Diagnosis not present

## 2024-01-26 DIAGNOSIS — I2584 Coronary atherosclerosis due to calcified coronary lesion: Secondary | ICD-10-CM | POA: Diagnosis not present

## 2024-01-26 DIAGNOSIS — I2510 Atherosclerotic heart disease of native coronary artery without angina pectoris: Secondary | ICD-10-CM | POA: Diagnosis not present

## 2024-01-26 DIAGNOSIS — Z951 Presence of aortocoronary bypass graft: Secondary | ICD-10-CM | POA: Diagnosis not present

## 2024-01-26 DIAGNOSIS — E7800 Pure hypercholesterolemia, unspecified: Secondary | ICD-10-CM | POA: Diagnosis not present

## 2024-01-26 DIAGNOSIS — Z1331 Encounter for screening for depression: Secondary | ICD-10-CM | POA: Diagnosis not present

## 2024-01-26 DIAGNOSIS — I10 Essential (primary) hypertension: Secondary | ICD-10-CM | POA: Diagnosis not present

## 2024-01-27 DIAGNOSIS — E291 Testicular hypofunction: Secondary | ICD-10-CM | POA: Diagnosis not present

## 2024-01-27 DIAGNOSIS — Z79890 Hormone replacement therapy (postmenopausal): Secondary | ICD-10-CM | POA: Diagnosis not present

## 2024-02-04 DIAGNOSIS — E291 Testicular hypofunction: Secondary | ICD-10-CM | POA: Diagnosis not present

## 2024-02-04 DIAGNOSIS — Z6835 Body mass index (BMI) 35.0-35.9, adult: Secondary | ICD-10-CM | POA: Diagnosis not present

## 2024-02-04 DIAGNOSIS — N529 Male erectile dysfunction, unspecified: Secondary | ICD-10-CM | POA: Diagnosis not present

## 2024-02-04 DIAGNOSIS — R6882 Decreased libido: Secondary | ICD-10-CM | POA: Diagnosis not present

## 2024-02-05 ENCOUNTER — Other Ambulatory Visit: Payer: Self-pay | Admitting: Family Medicine

## 2024-02-05 DIAGNOSIS — R9439 Abnormal result of other cardiovascular function study: Secondary | ICD-10-CM | POA: Diagnosis not present

## 2024-02-05 DIAGNOSIS — Z951 Presence of aortocoronary bypass graft: Secondary | ICD-10-CM | POA: Diagnosis not present

## 2024-02-05 DIAGNOSIS — I2510 Atherosclerotic heart disease of native coronary artery without angina pectoris: Secondary | ICD-10-CM | POA: Diagnosis not present

## 2024-02-05 DIAGNOSIS — Z87891 Personal history of nicotine dependence: Secondary | ICD-10-CM | POA: Diagnosis not present

## 2024-02-05 DIAGNOSIS — I2584 Coronary atherosclerosis due to calcified coronary lesion: Secondary | ICD-10-CM | POA: Diagnosis not present

## 2024-02-05 DIAGNOSIS — I872 Venous insufficiency (chronic) (peripheral): Secondary | ICD-10-CM | POA: Diagnosis not present

## 2024-02-05 DIAGNOSIS — I25810 Atherosclerosis of coronary artery bypass graft(s) without angina pectoris: Secondary | ICD-10-CM | POA: Diagnosis not present

## 2024-02-05 DIAGNOSIS — Z79899 Other long term (current) drug therapy: Secondary | ICD-10-CM | POA: Diagnosis not present

## 2024-02-05 DIAGNOSIS — M180 Bilateral primary osteoarthritis of first carpometacarpal joints: Secondary | ICD-10-CM

## 2024-02-05 DIAGNOSIS — E7800 Pure hypercholesterolemia, unspecified: Secondary | ICD-10-CM | POA: Diagnosis not present

## 2024-02-05 DIAGNOSIS — I2582 Chronic total occlusion of coronary artery: Secondary | ICD-10-CM | POA: Diagnosis not present

## 2024-02-05 DIAGNOSIS — I10 Essential (primary) hypertension: Secondary | ICD-10-CM | POA: Diagnosis not present

## 2024-02-05 DIAGNOSIS — Z8616 Personal history of COVID-19: Secondary | ICD-10-CM | POA: Diagnosis not present

## 2024-02-05 DIAGNOSIS — Z7982 Long term (current) use of aspirin: Secondary | ICD-10-CM | POA: Diagnosis not present

## 2024-02-09 DIAGNOSIS — F4320 Adjustment disorder, unspecified: Secondary | ICD-10-CM | POA: Diagnosis not present

## 2024-03-22 DIAGNOSIS — I10 Essential (primary) hypertension: Secondary | ICD-10-CM | POA: Diagnosis not present

## 2024-03-22 DIAGNOSIS — I255 Ischemic cardiomyopathy: Secondary | ICD-10-CM | POA: Diagnosis not present

## 2024-03-22 DIAGNOSIS — I25810 Atherosclerosis of coronary artery bypass graft(s) without angina pectoris: Secondary | ICD-10-CM | POA: Diagnosis not present

## 2024-03-25 DIAGNOSIS — F4320 Adjustment disorder, unspecified: Secondary | ICD-10-CM | POA: Diagnosis not present

## 2024-04-22 ENCOUNTER — Ambulatory Visit (INDEPENDENT_AMBULATORY_CARE_PROVIDER_SITE_OTHER): Admitting: Sports Medicine

## 2024-04-22 ENCOUNTER — Ambulatory Visit

## 2024-04-22 VITALS — BP 120/67 | HR 72 | Wt 278.0 lb

## 2024-04-22 DIAGNOSIS — I10 Essential (primary) hypertension: Secondary | ICD-10-CM | POA: Diagnosis not present

## 2024-04-22 DIAGNOSIS — R5383 Other fatigue: Secondary | ICD-10-CM | POA: Diagnosis not present

## 2024-04-22 DIAGNOSIS — Z041 Encounter for examination and observation following transport accident: Secondary | ICD-10-CM | POA: Diagnosis not present

## 2024-04-22 DIAGNOSIS — S134XXA Sprain of ligaments of cervical spine, initial encounter: Secondary | ICD-10-CM | POA: Diagnosis not present

## 2024-04-22 DIAGNOSIS — M542 Cervicalgia: Secondary | ICD-10-CM

## 2024-04-22 DIAGNOSIS — M546 Pain in thoracic spine: Secondary | ICD-10-CM | POA: Diagnosis not present

## 2024-04-22 DIAGNOSIS — M47814 Spondylosis without myelopathy or radiculopathy, thoracic region: Secondary | ICD-10-CM | POA: Diagnosis not present

## 2024-04-22 DIAGNOSIS — M4312 Spondylolisthesis, cervical region: Secondary | ICD-10-CM | POA: Diagnosis not present

## 2024-04-22 DIAGNOSIS — I255 Ischemic cardiomyopathy: Secondary | ICD-10-CM | POA: Diagnosis not present

## 2024-04-22 DIAGNOSIS — M5030 Other cervical disc degeneration, unspecified cervical region: Secondary | ICD-10-CM | POA: Insufficient documentation

## 2024-04-22 DIAGNOSIS — M4802 Spinal stenosis, cervical region: Secondary | ICD-10-CM | POA: Diagnosis not present

## 2024-04-22 DIAGNOSIS — M47812 Spondylosis without myelopathy or radiculopathy, cervical region: Secondary | ICD-10-CM | POA: Diagnosis not present

## 2024-04-22 DIAGNOSIS — R519 Headache, unspecified: Secondary | ICD-10-CM | POA: Diagnosis not present

## 2024-04-22 MED ORDER — METHOCARBAMOL 500 MG PO TABS
500.0000 mg | ORAL_TABLET | Freq: Three times a day (TID) | ORAL | 0 refills | Status: DC
Start: 2024-04-22 — End: 2024-05-31

## 2024-04-22 MED ORDER — PREDNISONE 50 MG PO TABS: ORAL_TABLET | ORAL | 0 refills | Status: DC

## 2024-04-22 MED ORDER — KETOROLAC TROMETHAMINE 30 MG/ML IJ SOLN
30.0000 mg | Freq: Once | INTRAMUSCULAR | Status: AC
  Administered 2024-04-22: 30 mg via INTRAMUSCULAR

## 2024-04-22 MED ORDER — METHYLPREDNISOLONE SODIUM SUCC 125 MG IJ SOLR
125.0000 mg | Freq: Once | INTRAMUSCULAR | Status: AC
  Administered 2024-04-22: 125 mg via INTRAMUSCULAR
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
## 2024-04-22 NOTE — Addendum Note (Signed)
 Addended by: Montgomery Apgar on: 04/22/2024 11:46 AM   Modules accepted: Orders
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
## 2024-04-29 ENCOUNTER — Ambulatory Visit: Payer: Self-pay | Admitting: Sports Medicine

## 2024-05-20 ENCOUNTER — Ambulatory Visit: Admitting: Sports Medicine

## 2024-05-20 DIAGNOSIS — I255 Ischemic cardiomyopathy: Secondary | ICD-10-CM | POA: Diagnosis not present

## 2024-05-20 DIAGNOSIS — G4733 Obstructive sleep apnea (adult) (pediatric): Secondary | ICD-10-CM | POA: Diagnosis not present

## 2024-05-20 DIAGNOSIS — S134XXA Sprain of ligaments of cervical spine, initial encounter: Secondary | ICD-10-CM | POA: Diagnosis not present

## 2024-05-20 DIAGNOSIS — R519 Headache, unspecified: Secondary | ICD-10-CM | POA: Diagnosis not present

## 2024-05-20 DIAGNOSIS — I10 Essential (primary) hypertension: Secondary | ICD-10-CM | POA: Diagnosis not present

## 2024-05-20 MED ORDER — GABAPENTIN 300 MG PO CAPS: ORAL_CAPSULE | ORAL | 3 refills | Status: AC
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
## 2024-05-29 ENCOUNTER — Ambulatory Visit

## 2024-05-29 DIAGNOSIS — S134XXA Sprain of ligaments of cervical spine, initial encounter: Secondary | ICD-10-CM

## 2024-05-31 ENCOUNTER — Other Ambulatory Visit: Payer: Self-pay | Admitting: Sports Medicine

## 2024-05-31 ENCOUNTER — Ambulatory Visit: Payer: Self-pay | Admitting: Sports Medicine

## 2024-05-31 DIAGNOSIS — S134XXA Sprain of ligaments of cervical spine, initial encounter: Secondary | ICD-10-CM

## 2024-05-31 MED ORDER — METHOCARBAMOL 500 MG PO TABS: 500.0000 mg | ORAL_TABLET | Freq: Three times a day (TID) | ORAL | 0 refills | Status: AC

## 2024-06-15 DIAGNOSIS — Z79890 Hormone replacement therapy (postmenopausal): Secondary | ICD-10-CM | POA: Diagnosis not present

## 2024-06-15 DIAGNOSIS — E291 Testicular hypofunction: Secondary | ICD-10-CM | POA: Diagnosis not present

## 2024-06-16 ENCOUNTER — Ambulatory Visit (INDEPENDENT_AMBULATORY_CARE_PROVIDER_SITE_OTHER): Payer: BC Managed Care – PPO | Admitting: Family Medicine

## 2024-06-16 ENCOUNTER — Encounter: Payer: Self-pay | Admitting: Family Medicine

## 2024-06-16 VITALS — BP 153/78 | HR 80 | Temp 98.0°F | Ht 71.0 in | Wt 283.0 lb

## 2024-06-16 DIAGNOSIS — N401 Enlarged prostate with lower urinary tract symptoms: Secondary | ICD-10-CM | POA: Diagnosis not present

## 2024-06-16 DIAGNOSIS — I2510 Atherosclerotic heart disease of native coronary artery without angina pectoris: Secondary | ICD-10-CM

## 2024-06-16 DIAGNOSIS — E785 Hyperlipidemia, unspecified: Secondary | ICD-10-CM

## 2024-06-16 DIAGNOSIS — I10 Essential (primary) hypertension: Secondary | ICD-10-CM | POA: Diagnosis not present

## 2024-06-16 DIAGNOSIS — R7301 Impaired fasting glucose: Secondary | ICD-10-CM | POA: Diagnosis not present

## 2024-06-16 DIAGNOSIS — I255 Ischemic cardiomyopathy: Secondary | ICD-10-CM | POA: Diagnosis not present

## 2024-06-16 MED ORDER — TADALAFIL 5 MG PO TABS: 5.0000 mg | ORAL_TABLET | Freq: Every day | ORAL | 1 refills | Status: DC

## 2024-06-16 NOTE — Assessment & Plan Note (Signed)
 He was given a refill on his nitroglycerin  per cardiology.
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
## 2024-06-16 NOTE — Assessment & Plan Note (Signed)
 Lab Results  Component Value Date   HGBA1C 5.2 12/17/2023   We will get updated A1c on labs today.

## 2024-06-16 NOTE — Assessment & Plan Note (Addendum)
 Not well controlled.  His cardiologist had made some changes.  Based on his blood pressure today I would recommend going up on his losartan but he is following up with cardiology tomorrow so he is welcome to wait until then to discuss changes and alternatives with them.  They did start him on typical flow send as well he seems to be tolerating that well.

## 2024-06-16 NOTE — Assessment & Plan Note (Signed)
 He had actually been taking the sildenafil  daily for his prostate symptoms.  We discussed that that really is more as needed for ED so we will switch him to Cialis  low-dose.  He is on isosorbide so we will need to monitor that carefully but he has been on that and he has been taking the sildenafil .

## 2024-06-16 NOTE — Assessment & Plan Note (Signed)
Due to recheck lipid panel.

## 2024-06-17 ENCOUNTER — Telehealth: Payer: Self-pay

## 2024-06-17 ENCOUNTER — Ambulatory Visit: Payer: Self-pay | Admitting: Family Medicine

## 2024-06-17 DIAGNOSIS — E639 Nutritional deficiency, unspecified: Secondary | ICD-10-CM

## 2024-06-17 DIAGNOSIS — I255 Ischemic cardiomyopathy: Secondary | ICD-10-CM | POA: Diagnosis not present

## 2024-06-17 DIAGNOSIS — Z951 Presence of aortocoronary bypass graft: Secondary | ICD-10-CM | POA: Diagnosis not present

## 2024-06-17 DIAGNOSIS — I25810 Atherosclerosis of coronary artery bypass graft(s) without angina pectoris: Secondary | ICD-10-CM | POA: Diagnosis not present

## 2024-06-17 DIAGNOSIS — I10 Essential (primary) hypertension: Secondary | ICD-10-CM | POA: Diagnosis not present

## 2024-06-17 LAB — LIPID PANEL
Chol/HDL Ratio: 3.7 ratio (ref 0.0–5.0)
Cholesterol, Total: 123 mg/dL (ref 100–199)
HDL: 33 mg/dL — ABNORMAL LOW (ref >39)
LDL Chol Calc (NIH): 65 mg/dL (ref 0–99)
Triglycerides: 141 mg/dL (ref 0–149)
VLDL Cholesterol Cal: 25 mg/dL (ref 5–40)

## 2024-06-17 LAB — PSA: Prostate Specific Ag, Serum: 2.6 ng/mL (ref 0.0–4.0)

## 2024-06-17 LAB — HEMOGLOBIN A1C
Est. average glucose Bld gHb Est-mCnc: 120 mg/dL
Hgb A1c MFr Bld: 5.8 % — ABNORMAL HIGH (ref 4.8–5.6)

## 2024-06-17 NOTE — Telephone Encounter (Signed)
 Copied from CRM [id number]. Topic: Clinical - Request for Lab/Test Order >> Jun 17, 2024 11:54 AM Farrel B wrote: Reason for CRM: Patient (Mr. Bobby White) called from [phone] to advise he had a visit with the cardiologist yesterday and was advised to inform Dr. Alvan he is needing labs done for a B12 panel please call patient at the number listed above

## 2024-06-17 NOTE — Telephone Encounter (Signed)
 Okay.  Order placed for B1, B6 and B12.

## 2024-06-17 NOTE — Progress Notes (Signed)
 Hi Bobby White,  Overall cholesterol looks good.  Your A1c is jumped up to 5.8 in the prediabetes range you really been doing it great job keeping it under 5.7 but it has jumped up a little bit so just cut back on sweets and carbohydrates and that should help.  Prostate test looks normal.

## 2024-06-18 NOTE — Telephone Encounter (Signed)
 Patient informed.

## 2024-06-21 DIAGNOSIS — E639 Nutritional deficiency, unspecified: Secondary | ICD-10-CM | POA: Diagnosis not present

## 2024-06-22 DIAGNOSIS — E291 Testicular hypofunction: Secondary | ICD-10-CM | POA: Diagnosis not present

## 2024-06-24 ENCOUNTER — Ambulatory Visit: Payer: Self-pay | Admitting: Family Medicine

## 2024-06-24 LAB — VITAMIN B12: Vitamin B-12: 1448 pg/mL — ABNORMAL HIGH (ref 232–1245)

## 2024-06-24 LAB — VITAMIN B6: Vitamin B6: 20.4 ug/L (ref 3.4–65.2)

## 2024-06-24 LAB — VITAMIN B1: Thiamine: 113.8 nmol/L (ref 66.5–200.0)

## 2024-06-24 NOTE — Telephone Encounter (Signed)
 Faxed lab results to Delon Kiel, NP with [facility] cardiology .

## 2024-06-24 NOTE — Progress Notes (Signed)
 [name],   Your B vitamins look good overall in fact if you are taking extra B12 you can decrease how often maybe every other day.

## 2024-06-25 NOTE — Telephone Encounter (Signed)
 Can you let pt know youfaxed it. Thank you Also remind him to take his amlodipine  too

## 2024-06-28 NOTE — Telephone Encounter (Signed)
 Attn:  Jon VEAR Pear, CMA     Sequella from [facility] calling back asking if you could refax the documents to this new number.  The power went out and the documents will not print from that other fax number.   (Documents: July 30 and July 31 lab results)    New fax # [phone]

## 2024-07-01 ENCOUNTER — Ambulatory Visit (INDEPENDENT_AMBULATORY_CARE_PROVIDER_SITE_OTHER): Admitting: Sports Medicine

## 2024-07-01 DIAGNOSIS — M5030 Other cervical disc degeneration, unspecified cervical region: Secondary | ICD-10-CM | POA: Diagnosis not present

## 2024-07-01 MED ORDER — TRAMADOL HCL 50 MG PO TABS: 50.0000 mg | ORAL_TABLET | Freq: Three times a day (TID) | ORAL | 0 refills | Status: DC | PRN

## 2024-07-01 MED ORDER — TRAMADOL HCL 50 MG PO TABS
50.0000 mg | ORAL_TABLET | Freq: Three times a day (TID) | ORAL | 0 refills | Status: AC | PRN
Start: 2024-07-01 — End: ?
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
## 2024-07-12 ENCOUNTER — Telehealth: Payer: Self-pay

## 2024-07-12 ENCOUNTER — Other Ambulatory Visit (HOSPITAL_COMMUNITY): Payer: Self-pay

## 2024-07-12 NOTE — Telephone Encounter (Signed)
 PA request has been Received. New Encounter has been or will be created for follow up. For additional info see Pharmacy Prior Auth telephone encounter from 07/12/24.

## 2024-07-12 NOTE — Telephone Encounter (Signed)
 Spoke with Sharyne at [employer]   She thinks that this may be requiring a prior authortization through [employer] # [phone]. Forwarding message to PA team to work on this for the patient.

## 2024-07-12 NOTE — Telephone Encounter (Signed)
 Copied from CRM [id number]. Topic: Clinical - Medication Question >> Jul 09, 2024  3:49 PM Carmell R wrote: Reason for CRM: Pt called back stating that he is not able to get the Cialis  prescribed unless its written or requested for the prostate. He needs something to help with his prostate. Asking for a follow up from Dr. Alvan or her nurse as soon as possible on this. [phone].

## 2024-07-12 NOTE — Telephone Encounter (Signed)
 Pharmacy Patient Advocate Encounter   Received notification from Pt Calls Messages that prior authorization for Tadalafil  5MG  tablets  is required/requested.   Insurance verification completed.   The patient is [place] through [facility] [place] .   Per test claim: The current 90 day co-pay is, $0.  No PA needed at this time. This test claim was processed through [facility]- copay amounts may vary at other pharmacies due to pharmacy/plan contracts, or as the patient moves through the different stages of their insurance plan.

## 2024-07-20 ENCOUNTER — Encounter: Payer: Self-pay | Admitting: Sports Medicine

## 2024-07-28 ENCOUNTER — Telehealth: Payer: Self-pay

## 2024-07-28 DIAGNOSIS — N401 Enlarged prostate with lower urinary tract symptoms: Secondary | ICD-10-CM

## 2024-07-28 MED ORDER — FINASTERIDE 5 MG PO TABS: 5.0000 mg | ORAL_TABLET | Freq: Every day | ORAL | 1 refills | Status: AC

## 2024-07-28 NOTE — Telephone Encounter (Signed)
 Okay, make sure you are taking the Flomax  and then more going to add a medicine called finasteride .  Lets try this for couple months and see if he notices improvement only thing about the finasteride  as it works a little bit more slowly.  But we could certainly try it.  Was the tadalafil  5 mg not effective?  It looks like it was covered by the insurance on our end so just was not sure if maybe it just was not working very well or if he was never able to get it.

## 2024-07-28 NOTE — Telephone Encounter (Signed)
 Copied from CRM [id number]. Topic: Clinical - Medication Question >> Jul 28, 2024  9:29 AM Marda G wrote: Currently taking Cialis  ...question if he will be taking Sildenafil  for swollen prostate. >> Jul 28, 2024  3:02 PM Carrielelia G wrote: Pt Vahi called back regarding Sildenafil  meds. Bobbette stated she will get a message to provider.  Patient said okay.  >> Jul 28, 2024  9:39 AM Carrielelia G wrote: Currently taking Cialis  .SABRASABRAPatient Vahi calling to ask if doctor will be changing his medication to Sildenafil  because of his swollen prostate (not erectile dysfunction) please advise.

## 2024-07-28 NOTE — Telephone Encounter (Signed)
 Patient called office to see if he could get medication for enlarged prostate instead of the medication for erectile dysfunction, please advise, thanks.

## 2024-07-29 ENCOUNTER — Other Ambulatory Visit: Payer: Self-pay | Admitting: Family Medicine

## 2024-07-29 NOTE — Telephone Encounter (Signed)
 Kim there is already a separate note open on this 1.  Some going to go ahead and close this 1.

## 2024-07-30 NOTE — Discharge Instructions (Signed)

## 2024-08-02 ENCOUNTER — Ambulatory Visit
Admission: RE | Admit: 2024-08-02 | Discharge: 2024-08-02 | Disposition: A | Discharge status: Home / Self Care | Source: Ambulatory Visit | Attending: Sports Medicine | Admitting: Sports Medicine

## 2024-08-02 DIAGNOSIS — M4722 Other spondylosis with radiculopathy, cervical region: Secondary | ICD-10-CM | POA: Diagnosis not present

## 2024-08-02 DIAGNOSIS — M5030 Other cervical disc degeneration, unspecified cervical region: Secondary | ICD-10-CM

## 2024-08-02 MED ORDER — TRIAMCINOLONE ACETONIDE 40 MG/ML IJ SUSP (RADIOLOGY)
80.0000 mg | Freq: Once | INTRAMUSCULAR | Status: AC
Start: 2024-08-02 — End: 2024-08-02
  Administered 2024-08-02: 80 mg via EPIDURAL

## 2024-08-02 MED ORDER — IOPAMIDOL (ISOVUE-M 300) INJECTION 61%
1.0000 mL | Freq: Once | INTRAMUSCULAR | Status: AC
  Administered 2024-08-02: 1 mL via EPIDURAL

## 2024-09-07 ENCOUNTER — Ambulatory Visit (INDEPENDENT_AMBULATORY_CARE_PROVIDER_SITE_OTHER)

## 2024-09-07 VITALS — BP 138/82 | Ht 71.0 in | Wt 283.0 lb

## 2024-09-07 DIAGNOSIS — M47812 Spondylosis without myelopathy or radiculopathy, cervical region: Secondary | ICD-10-CM | POA: Diagnosis not present

## 2024-09-07 DIAGNOSIS — G5623 Lesion of ulnar nerve, bilateral upper limbs: Secondary | ICD-10-CM | POA: Diagnosis not present

## 2024-09-07 DIAGNOSIS — G5622 Lesion of ulnar nerve, left upper limb: Secondary | ICD-10-CM | POA: Diagnosis not present

## 2024-09-07 DIAGNOSIS — G5621 Lesion of ulnar nerve, right upper limb: Secondary | ICD-10-CM | POA: Diagnosis not present
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
## 2024-09-09 ENCOUNTER — Other Ambulatory Visit: Payer: Self-pay

## 2024-09-09 DIAGNOSIS — M47812 Spondylosis without myelopathy or radiculopathy, cervical region: Secondary | ICD-10-CM

## 2024-09-30 ENCOUNTER — Ambulatory Visit: Payer: Self-pay

## 2024-09-30 NOTE — Telephone Encounter (Signed)
 3rd attempt- routing to office

## 2024-09-30 NOTE — Telephone Encounter (Signed)
 This Rn left VM to call back to discuss symptoms. Attempt #1- former Dr Curtis, now scheduled with Dr Alvan in January  Message from [place] [facility] sent at 09/30/2024 12:24 PM EST  Summary: Possible sinus infection   Reason for Triage: Pt declined scheduling an appt however believes he has a sinus infection and would like something called in for him. Please advise  Best contact: [phone]

## 2024-09-30 NOTE — Telephone Encounter (Signed)
 Message from [place] [facility] sent at 09/30/2024 12:24 PM EST  Summary: Possible sinus infection   Reason for Triage: Pt declined scheduling an appt however believes he has a sinus infection and would like something called in for him. Please advise  Best contact: [phone]          Phone call placed to patient. Voicemail left for patient to call back to Nurse triage

## 2024-09-30 NOTE — Telephone Encounter (Signed)
  This Rn left VM to call back to discuss symptoms. Attempt #3- former Dr Curtis, now scheduled with Dr Alvan in January   Message from Bobby White sent at 09/30/2024 12:24 PM EST       Summary: Possible sinus infection     Reason for Triage: Pt declined scheduling an appt however believes he has a sinus infection and would like something called in for him. Please advise  Best contact: [phone]

## 2024-09-30 NOTE — Telephone Encounter (Signed)
 FYI Only or Action Required?: Action required by provider: requesting medication for sinus infection.  Patient was last seen in primary care on 07/01/2024 by Curtis Debby PARAS, MD.  Called Nurse Triage reporting Facial Pain.  Symptoms began several weeks ago.  Interventions attempted: OTC medications: Vicks cold medicine and Rest, hydration, or home remedies.  Symptoms are: unchanged.  Triage Disposition: Home Care  Patient/caregiver understands and will follow disposition?: Yes  Reason for Disposition  [1] Sinus congestion as part of a cold AND [2] present < 10 days  Answer Assessment - Initial Assessment Questions States when he blows his nose he states and smells infection. Taking vicks daytime medicine and he is constantly feeling like he has to grind teeth. Patient is looking to have something called into [facility] on file. Please advise.   Patient also requested to set up his annual physical, assisted patient with that.   1. LOCATION: Where does it hurt?      Whole face, eyes  2. ONSET: When did the sinus pain start?  (e.g., hours, days)      Saturday  3. SEVERITY: How bad is the pain?   (Scale 0-10; or none, mild, moderate or severe)     Moderate   4. NASAL CONGESTION: Is the nose blocked? If Yes, ask: Can you open it or must you breathe through your mouth?     Nasal passages get blocked at night  5. NASAL DISCHARGE: Do you have discharge from your nose? If so ask, What color?     Runny nose   6. FEVER: Do you have a fever? If Yes, ask: What is it, how was it measured, and when did it start?      Denies  7. OTHER SYMPTOMS: Do you have any other symptoms? (e.g., sore throat, cough, earache, difficulty breathing)     Headache  Protocols used: Sinus Pain or Congestion-A-AH

## 2024-10-01 NOTE — Telephone Encounter (Signed)
 Needs appt, can do e visit online if he would like

## 2024-10-04 ENCOUNTER — Ambulatory Visit (INDEPENDENT_AMBULATORY_CARE_PROVIDER_SITE_OTHER): Admitting: Physician Assistant

## 2024-10-04 ENCOUNTER — Encounter: Payer: Self-pay | Admitting: Physician Assistant

## 2024-10-04 VITALS — BP 150/84 | HR 112 | Temp 98.6°F | Ht 71.0 in | Wt 277.0 lb

## 2024-10-04 DIAGNOSIS — J0100 Acute maxillary sinusitis, unspecified: Secondary | ICD-10-CM

## 2024-10-04 DIAGNOSIS — N401 Enlarged prostate with lower urinary tract symptoms: Secondary | ICD-10-CM

## 2024-10-04 DIAGNOSIS — I10 Essential (primary) hypertension: Secondary | ICD-10-CM

## 2024-10-04 MED ORDER — AMOXICILLIN-POT CLAVULANATE 875-125 MG PO TABS: 1.0000 | ORAL_TABLET | Freq: Two times a day (BID) | ORAL | 0 refills | Status: DC

## 2024-10-04 MED ORDER — METHYLPREDNISOLONE 4 MG PO TBPK: ORAL_TABLET | ORAL | 0 refills | Status: DC

## 2024-10-04 MED ORDER — TADALAFIL 5 MG PO TABS: 5.0000 mg | ORAL_TABLET | Freq: Every day | ORAL | 0 refills | Status: AC

## 2024-10-04 NOTE — Telephone Encounter (Signed)
 Patient seen today 10/04/2024 with Vermell Bologna, PA

## 2024-10-04 NOTE — Progress Notes (Unsigned)
 Acute Office Visit  Subjective:     Patient ID: Bobby White, male    DOB: Apr 20, 1961, 63 y.o.   MRN: [id number]    HPI .SABRADiscussed the use of AI scribe software for clinical note transcription with the patient, who gave verbal consent to proceed.  History of Present Illness Bobby White is a 63 year old male who presents with symptoms suggestive of a sinus infection.  Upper respiratory symptoms - Body aches and persistent sensation of 'fire in the sinus' for at least one week - Constant nasal discharge, described as 'nasty' with a noticeable smell - Symptoms began two Saturdays ago - No known sick contacts, but potential community exposure - No COVID or influenza testing performed - Using Vicks Daytime Severe Cold medication  Cardiovascular findings - Elevated heart rate, possibly related to running to the appointment - Blood pressure noted to be high during the visit  Request refill of Cialis  for BPH. No concerns.     ROS See HPI.     Objective:    BP (!) 150/84   Pulse (!) 112   Temp 98.6 F (37 C) (Oral)   Ht 5' 11 (1.803 m)   Wt 277 lb (125.6 kg)   SpO2 96%   BMI 38.63 kg/m  BP Readings from Last 3 Encounters:  10/04/24 (!) 150/84  09/07/24 138/82  08/02/24 (!) 150/93   Wt Readings from Last 3 Encounters:  10/04/24 277 lb (125.6 kg)  09/07/24 283 lb (128.4 kg)  06/16/24 283 lb (128.4 kg)      Physical Exam Constitutional:      Appearance: Normal appearance. He is obese.  HENT:     Head: Normocephalic.     Comments: Right sided sinus tenderness to palpation worse than left.     Right Ear: Tympanic membrane, ear canal and external ear normal. There is no impacted cerumen.     Left Ear: Tympanic membrane, ear canal and external ear normal. There is no impacted cerumen.     Nose: Congestion present.     Mouth/Throat:     Mouth: Mucous membranes are moist.     Pharynx: Posterior oropharyngeal erythema present. No oropharyngeal  exudate.  Eyes:     Conjunctiva/sclera: Conjunctivae normal.  Cardiovascular:     Rate and Rhythm: Regular rhythm. Tachycardia present.  Pulmonary:     Effort: Pulmonary effort is normal.     Breath sounds: Normal breath sounds.  Musculoskeletal:     Cervical back: Normal range of motion and neck supple. No tenderness.  Lymphadenopathy:     Cervical: Cervical adenopathy present.  Neurological:     General: No focal deficit present.     Mental Status: He is alert.  Psychiatric:        Mood and Affect: Mood normal.         Assessment & Plan:  Bobby White was seen today for sinus problem.  Diagnoses and all orders for this visit:  Acute non-recurrent maxillary sinusitis -     methylPREDNISolone  (MEDROL  DOSEPAK) 4 MG TBPK tablet; Take as directed by package insert. -     amoxicillin -clavulanate (AUGMENTIN ) 875-125 MG tablet; Take 1 tablet by mouth 2 (two) times daily.  Benign prostatic hyperplasia with lower urinary tract symptoms, symptom details unspecified -     tadalafil  (CIALIS ) 5 MG tablet; Take 1 tablet (5 mg total) by mouth daily.  HYPERTENSION, BENIGN ESSENTIAL    Assessment & Plan Acute maxillary sinusitis Symptoms include body aches,  sinus pressure, and right-sided tenderness. No fever. Diagnosis based on clinical presentation. - Prescribed Augmentin  for 10 days. - Prescribed Medrol  Dosepak. - Advised follow-up as needed. - Recommended follow-up with PCP for blood pressure management. Keep log at home. BP did not improve much on 2nd recheck.   BPH - REfilled Cialis .     Return if symptoms worsen or fail to improve.  Bobby Reesman, PA-C

## 2024-10-04 NOTE — Patient Instructions (Signed)

## 2024-10-05 ENCOUNTER — Encounter: Payer: Self-pay | Admitting: Physician Assistant

## 2024-10-05 NOTE — Discharge Instructions (Signed)

## 2024-10-06 ENCOUNTER — Inpatient Hospital Stay
Admission: RE | Admit: 2024-10-06 | Discharge: 2024-10-06 | Disposition: A | Discharge status: Home / Self Care | Source: Ambulatory Visit

## 2024-10-19 DIAGNOSIS — R5383 Other fatigue: Secondary | ICD-10-CM | POA: Diagnosis not present

## 2024-10-19 DIAGNOSIS — E291 Testicular hypofunction: Secondary | ICD-10-CM | POA: Diagnosis not present

## 2024-10-19 DIAGNOSIS — Z79890 Hormone replacement therapy (postmenopausal): Secondary | ICD-10-CM | POA: Diagnosis not present

## 2024-10-21 DIAGNOSIS — R6882 Decreased libido: Secondary | ICD-10-CM | POA: Diagnosis not present

## 2024-10-21 DIAGNOSIS — E291 Testicular hypofunction: Secondary | ICD-10-CM | POA: Diagnosis not present

## 2024-10-21 DIAGNOSIS — M6281 Muscle weakness (generalized): Secondary | ICD-10-CM | POA: Diagnosis not present

## 2024-10-21 DIAGNOSIS — Z6838 Body mass index (BMI) 38.0-38.9, adult: Secondary | ICD-10-CM | POA: Diagnosis not present

## 2024-11-15 ENCOUNTER — Encounter: Payer: Self-pay | Admitting: Family Medicine

## 2024-11-15 ENCOUNTER — Ambulatory Visit (INDEPENDENT_AMBULATORY_CARE_PROVIDER_SITE_OTHER): Admitting: Family Medicine

## 2024-11-15 VITALS — BP 127/76 | HR 97 | Ht 71.0 in | Wt 280.1 lb

## 2024-11-15 DIAGNOSIS — L700 Acne vulgaris: Secondary | ICD-10-CM

## 2024-11-15 DIAGNOSIS — I10 Essential (primary) hypertension: Secondary | ICD-10-CM | POA: Diagnosis not present

## 2024-11-15 DIAGNOSIS — R051 Acute cough: Secondary | ICD-10-CM

## 2024-11-15 DIAGNOSIS — Z0000 Encounter for general adult medical examination without abnormal findings: Secondary | ICD-10-CM | POA: Diagnosis not present

## 2024-11-15 DIAGNOSIS — R7301 Impaired fasting glucose: Secondary | ICD-10-CM

## 2024-11-15 MED ORDER — METRONIDAZOLE 1 % EX GEL: Freq: Every day | CUTANEOUS | 3 refills | Status: AC
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
## 2024-11-16 ENCOUNTER — Ambulatory Visit: Payer: Self-pay | Admitting: Family Medicine

## 2024-11-16 LAB — CMP14+EGFR
ALT: 46 IU/L — ABNORMAL HIGH (ref 0–44)
AST: 25 IU/L (ref 0–40)
Albumin: 4.6 g/dL (ref 3.9–4.9)
Alkaline Phosphatase: 89 IU/L (ref 47–123)
BUN/Creatinine Ratio: 16 (ref 10–24)
BUN: 14 mg/dL (ref 8–27)
Bilirubin Total: 0.8 mg/dL (ref 0.0–1.2)
CO2: 20 mmol/L (ref 20–29)
Calcium: 9.7 mg/dL (ref 8.6–10.2)
Chloride: 102 mmol/L (ref 96–106)
Creatinine, Ser: 0.89 mg/dL (ref 0.76–1.27)
Globulin, Total: 2.3 g/dL (ref 1.5–4.5)
Glucose: 110 mg/dL — ABNORMAL HIGH (ref 70–99)
Potassium: 4.4 mmol/L (ref 3.5–5.2)
Sodium: 142 mmol/L (ref 134–144)
Total Protein: 6.9 g/dL (ref 6.0–8.5)
eGFR: 95 mL/min/1.73

## 2024-11-16 LAB — HEMOGLOBIN A1C
Est. average glucose Bld gHb Est-mCnc: 117 mg/dL
Hgb A1c MFr Bld: 5.7 % — ABNORMAL HIGH (ref 4.8–5.6)

## 2024-11-16 NOTE — Progress Notes (Signed)
 The A1c is stable, still in the prediabetes range but just a little bit better.  Continue to work on healthy diet and regular exercise.  Cutting out more greasy fried fatty foods and cutting back on carbohydrate intake.  The ALT liver enzyme is also slightly elevated again improving diet will help reduce that number and help reduce the inflammation in your liver.

## 2024-11-17 ENCOUNTER — Ambulatory Visit: Payer: Self-pay

## 2024-11-17 NOTE — Telephone Encounter (Signed)
 FYI Only or Action Required?: FYI only for provider: ED advised.  Patient was last seen in primary care on 11/15/2024 by Alvan Dorothyann BIRCH, MD.  Called Nurse Triage reporting Cough.  Symptoms began yesterday.  Interventions attempted: Nothing.  Symptoms are: unchanged.  Triage Disposition: Go to ED Now (Notify PCP)  Patient/caregiver understands and will follow disposition?: Yes     Copied from CRM #[id number]. Topic: Clinical - Red Word Triage >> Nov 17, 2024 12:29 PM Bobby White wrote: Red Word that prompted transfer to Nurse Triage: Chest pain// when patient coughs, sinuses are burning, requesting medication. Reason for Disposition  [1] MODERATE difficulty breathing (e.g., speaks in phrases, SOB even at rest, pulse 100-120) AND [2] still present when not coughing  Answer Assessment - Initial Assessment Questions Cough, dry throat- sharp pain, sinuses burning, all started last night when he went to lay down to sleep. Has coughed up so Pt does state he is having chest pain even with not coughing and shortness of breath just sitting there. Rn recommendations for ER. Pt asked are you kidding me? Rn gave rationale for why the ER. He asked about UC. RN advised that's his choice to try it but with his symptoms, the most appropriate place for him to go is ER for testing capabilities. Pt stated understanding.  Due to ED dispo, not all triage questions answered     1. ONSET: When did the cough begin?      Last night 2. SEVERITY: How bad is the cough today?      Mild to mod 3. SPUTUM: Describe the color of your sputum (e.g., none, dry cough; clear, white, yellow, green)     white 4. HEMOPTYSIS: Are you coughing up any blood? If Yes, ask: How much? (e.g., flecks, streaks, tablespoons, etc.)     Denies 5. DIFFICULTY BREATHING: Are you having difficulty breathing? If Yes, ask: How bad is it? (e.g., mild, moderate, severe)      Moderate, shortness of breath  6. FEVER: Do  you have a fever? If Yes, ask: What is your temperature, how was it measured, and when did it start?     denies 7. CARDIAC HISTORY: Do you have any history of heart disease? (e.g., heart attack, congestive heart failure)      htn 8. LUNG HISTORY: Do you have any history of lung disease?  (e.g., pulmonary embolus, asthma, emphysema)      9. PE RISK FACTORS: Do you have a history of blood clots? (or: recent major surgery, recent prolonged travel, bedridden)      10. OTHER SYMPTOMS: Do you have any other symptoms? (e.g., runny nose, wheezing, chest pain)       Chest pain, shortness of breath.  Protocols used: Cough - Acute Productive-A-AH

## 2024-11-22 ENCOUNTER — Ambulatory Visit (INDEPENDENT_AMBULATORY_CARE_PROVIDER_SITE_OTHER)

## 2024-11-22 DIAGNOSIS — R051 Acute cough: Secondary | ICD-10-CM | POA: Diagnosis not present

## 2024-11-23 NOTE — Progress Notes (Signed)
 Hi Bobby White,  the chest xray itself looks pretty good but when was the last time your heart was evaluated this could be contributing to some of your symptoms if there is some issues going on there.  Was not sure when you last saw cardiology?

## 2024-11-24 ENCOUNTER — Other Ambulatory Visit: Payer: Self-pay | Admitting: Medical Genetics

## 2024-12-16 ENCOUNTER — Ambulatory Visit: Admitting: Family Medicine

## 2024-12-23 NOTE — Discharge Instructions (Signed)

## 2024-12-24 ENCOUNTER — Inpatient Hospital Stay: Admission: RE | Admit: 2024-12-24 | Source: Ambulatory Visit

## 2024-12-24 DIAGNOSIS — M47812 Spondylosis without myelopathy or radiculopathy, cervical region: Secondary | ICD-10-CM

## 2024-12-24 MED ORDER — IOPAMIDOL (ISOVUE-M 300) INJECTION 61%
1.0000 mL | Freq: Once | INTRAMUSCULAR | Status: AC | PRN
  Administered 2024-12-24: 1 mL via INTRA_ARTICULAR

## 2024-12-24 MED ORDER — DEXAMETHASONE SODIUM PHOSPHATE 4 MG/ML IJ SOLN
8.0000 mg | Freq: Once | INTRAMUSCULAR | Status: AC
  Administered 2024-12-24: 8 mg via INTRA_ARTICULAR

## 2025-05-16 ENCOUNTER — Ambulatory Visit: Admitting: Family Medicine
